# Patient Record
Sex: Female | Born: 1980 | Race: White | Hispanic: No | Marital: Single | Smoking: Current every day smoker
Health system: Southern US, Community
[De-identification: ages and names within clinical notes are randomized; demographics above are authoritative.]

## PROBLEM LIST (undated history)

## (undated) ENCOUNTER — Inpatient Hospital Stay (HOSPITAL_COMMUNITY): Payer: Self-pay

## (undated) ENCOUNTER — Inpatient Hospital Stay (HOSPITAL_COMMUNITY): Payer: Medicaid Other

## (undated) DIAGNOSIS — F329 Major depressive disorder, single episode, unspecified: Secondary | ICD-10-CM

## (undated) DIAGNOSIS — R51 Headache: Secondary | ICD-10-CM

## (undated) DIAGNOSIS — F1110 Opioid abuse, uncomplicated: Secondary | ICD-10-CM

## (undated) DIAGNOSIS — N390 Urinary tract infection, site not specified: Secondary | ICD-10-CM

## (undated) DIAGNOSIS — F419 Anxiety disorder, unspecified: Secondary | ICD-10-CM

## (undated) DIAGNOSIS — R87629 Unspecified abnormal cytological findings in specimens from vagina: Secondary | ICD-10-CM

## (undated) DIAGNOSIS — R519 Headache, unspecified: Secondary | ICD-10-CM

## (undated) DIAGNOSIS — F32A Depression, unspecified: Secondary | ICD-10-CM

## (undated) DIAGNOSIS — G8929 Other chronic pain: Secondary | ICD-10-CM

## (undated) DIAGNOSIS — A048 Other specified bacterial intestinal infections: Secondary | ICD-10-CM

## (undated) HISTORY — DX: Other chronic pain: G89.29

## (undated) HISTORY — DX: Headache, unspecified: R51.9

## (undated) HISTORY — DX: Headache: R51

## (undated) HISTORY — PX: WISDOM TOOTH EXTRACTION: SHX21

## (undated) HISTORY — PX: OTHER SURGICAL HISTORY: SHX169

## (undated) HISTORY — DX: Other specified bacterial intestinal infections: A04.8

---

## 2011-01-13 ENCOUNTER — Emergency Department (HOSPITAL_BASED_OUTPATIENT_CLINIC_OR_DEPARTMENT_OTHER)
Admission: EM | Admit: 2011-01-13 | Discharge: 2011-01-13 | Disposition: A | Discharge status: Home / Self Care | Payer: Self-pay | Attending: Emergency Medicine | Admitting: Emergency Medicine

## 2011-01-13 DIAGNOSIS — R51 Headache: Secondary | ICD-10-CM | POA: Insufficient documentation

## 2011-05-06 ENCOUNTER — Emergency Department (HOSPITAL_BASED_OUTPATIENT_CLINIC_OR_DEPARTMENT_OTHER)
Admission: EM | Admit: 2011-05-06 | Discharge: 2011-05-06 | Disposition: A | Discharge status: Home / Self Care | Payer: Self-pay | Attending: Emergency Medicine | Admitting: Emergency Medicine

## 2011-05-06 DIAGNOSIS — K089 Disorder of teeth and supporting structures, unspecified: Secondary | ICD-10-CM | POA: Insufficient documentation

## 2011-05-06 DIAGNOSIS — F17200 Nicotine dependence, unspecified, uncomplicated: Secondary | ICD-10-CM | POA: Insufficient documentation

## 2011-05-06 DIAGNOSIS — K0889 Other specified disorders of teeth and supporting structures: Secondary | ICD-10-CM

## 2011-05-06 MED ORDER — BUPIVACAINE HCL 0.5 % IJ SOLN
50.0000 mL | Freq: Once | INTRAMUSCULAR | Status: DC
  Filled 2011-05-06: qty 1

## 2011-05-06 MED ORDER — PENICILLIN V POTASSIUM 250 MG PO TABS: 250.0000 mg | ORAL_TABLET | Freq: Four times a day (QID) | ORAL | Status: AC

## 2011-05-06 MED ORDER — HYDROCODONE-ACETAMINOPHEN 5-500 MG PO TABS: 1.0000 | ORAL_TABLET | Freq: Four times a day (QID) | ORAL | Status: AC | PRN

## 2011-05-06 NOTE — ED Notes (Signed)
Dental pain that started last night.  States the tooth is broken.

## 2011-05-06 NOTE — ED Notes (Signed)
Deliah Boston, FNP at bedside.

## 2011-05-06 NOTE — ED Notes (Signed)
Pt asking for a dental block and she declines Lortab RX.

## 2011-05-06 NOTE — ED Provider Notes (Signed)
History     CSN: [id number] Arrival date & time: 05/06/2011 11:06 AM  Chief Complaint  Patient presents with  . Dental Pain   HPI Comments: Pt states that her right upper tooth broke about 2 weeks ago and she has started to have pain in the last couple of days  Patient is a 30 y.o. female presenting with tooth pain. The history is provided by the patient. No language interpreter was used.  Dental PainThe primary symptoms include mouth pain. Primary symptoms do not include fever. The symptoms began 3 to 5 days ago. The symptoms are worsening. The symptoms are new. The symptoms occur constantly.  Additional symptoms include: jaw pain. Additional symptoms do not include: facial swelling, trouble swallowing, pain with swallowing and ear pain.    History reviewed. No pertinent past medical history.    History reviewed. No pertinent past surgical history.  No family history on file.  History  Substance Use Topics  . Smoking status: Current Everyday Smoker -- 1.0 packs/day  . Smokeless tobacco: Not on file  . Alcohol Use: No    OB History    Grav Para Term Preterm Abortions TAB SAB Ect Mult Living                  Review of Systems  Constitutional: Negative.  Negative for fever.  HENT: Negative for ear pain, facial swelling and trouble swallowing.   Respiratory: Negative.   Cardiovascular: Negative.   Neurological: Negative.     Physical Exam  BP 119/71  Pulse 99  Temp(Src) 98.9 F (37.2 C) (Oral)  Resp 16  Ht 5\' 6"  (1.676 m)  Wt 180 lb (81.647 kg)  BMI 29.05 kg/m2  SpO2 100%  LMP 04/12/2011  Physical Exam  Nursing note and vitals reviewed. Constitutional: She is oriented to person, place, and time. She appears well-developed and well-nourished.  HENT:  Head: Normocephalic.  Right Ear: External ear normal.  Left Ear: External ear normal.  Mouth/Throat:    Eyes: Pupils are equal, round, and reactive to light.  Cardiovascular: Normal rate and regular rhythm.    Pulmonary/Chest: Effort normal and breath sounds normal.  Neurological: She is alert and oriented to person, place, and time.    ED Course  Dental Date/Time: 05/06/2011 1:07 PM Performed by: Lear Ng Authorized by: Lear Ng Consent: Verbal consent obtained. Risks and benefits: risks, benefits and alternatives were discussed Consent given by: patient Patient understanding: patient states understanding of the procedure being performed Patient consent: the patient's understanding of the procedure matches consent given Patient identity confirmed: verbally with patient Time out: Immediately prior to procedure a "time out" was called to verify the correct patient, procedure, equipment, support staff and site/side marked as required. Patient sedated: no Comments: Using 2 ml of marcaine 0.5%,administered by 5 cc syringe, dental block directly around right upper 2nd molar.  Pt tolerated well, pain seems improved.  No sig bleeding after procedure.      MDM Pt states that she has a dentist, but they are closed today      Teressa Lower, NP 05/06/11 1229     I have personally performed and participated in all the services and procedures documented herein. I have reviewed the findings with the patient.  Faatimah Spielberg Y.      Gavin Pound. Servando Kyllonen, MD 05/06/11 1310

## 2011-07-10 ENCOUNTER — Emergency Department (HOSPITAL_BASED_OUTPATIENT_CLINIC_OR_DEPARTMENT_OTHER)
Admission: EM | Admit: 2011-07-10 | Discharge: 2011-07-10 | Disposition: A | Discharge status: Home / Self Care | Payer: Self-pay | Attending: Emergency Medicine | Admitting: Emergency Medicine

## 2011-07-10 ENCOUNTER — Emergency Department (INDEPENDENT_AMBULATORY_CARE_PROVIDER_SITE_OTHER): Payer: Self-pay

## 2011-07-10 ENCOUNTER — Encounter (HOSPITAL_BASED_OUTPATIENT_CLINIC_OR_DEPARTMENT_OTHER): Payer: Self-pay | Admitting: *Deleted

## 2011-07-10 DIAGNOSIS — F17200 Nicotine dependence, unspecified, uncomplicated: Secondary | ICD-10-CM | POA: Insufficient documentation

## 2011-07-10 DIAGNOSIS — J449 Chronic obstructive pulmonary disease, unspecified: Secondary | ICD-10-CM | POA: Insufficient documentation

## 2011-07-10 DIAGNOSIS — R05 Cough: Secondary | ICD-10-CM

## 2011-07-10 DIAGNOSIS — J4489 Other specified chronic obstructive pulmonary disease: Secondary | ICD-10-CM | POA: Insufficient documentation

## 2011-07-10 DIAGNOSIS — R059 Cough, unspecified: Secondary | ICD-10-CM | POA: Insufficient documentation

## 2011-07-10 DIAGNOSIS — J40 Bronchitis, not specified as acute or chronic: Secondary | ICD-10-CM | POA: Insufficient documentation

## 2011-07-10 LAB — URINALYSIS, ROUTINE W REFLEX MICROSCOPIC
Bilirubin Urine: NEGATIVE
Glucose, UA: NEGATIVE mg/dL
Hgb urine dipstick: NEGATIVE
Specific Gravity, Urine: 1.02 (ref 1.005–1.030)
pH: 8.5 — ABNORMAL HIGH (ref 5.0–8.0)

## 2011-07-10 LAB — URINE MICROSCOPIC-ADD ON

## 2011-07-10 MED ORDER — ALBUTEROL SULFATE (5 MG/ML) 0.5% IN NEBU
5.0000 mg | INHALATION_SOLUTION | Freq: Once | RESPIRATORY_TRACT | Status: AC
  Administered 2011-07-10: 5 mg via RESPIRATORY_TRACT
  Filled 2011-07-10: qty 1

## 2011-07-10 MED ORDER — IPRATROPIUM BROMIDE 0.02 % IN SOLN
0.5000 mg | Freq: Once | RESPIRATORY_TRACT | Status: AC
  Administered 2011-07-10: 0.5 mg via RESPIRATORY_TRACT
  Filled 2011-07-10: qty 2.5

## 2011-07-10 MED ORDER — AZITHROMYCIN 250 MG PO TABS: 250.0000 mg | ORAL_TABLET | Freq: Every day | ORAL | Status: AC

## 2011-07-10 MED ORDER — ALBUTEROL SULFATE HFA 108 (90 BASE) MCG/ACT IN AERS: 1.0000 | INHALATION_SPRAY | Freq: Four times a day (QID) | RESPIRATORY_TRACT | Status: DC | PRN

## 2011-07-10 MED ORDER — METRONIDAZOLE 500 MG PO TABS: 500.0000 mg | ORAL_TABLET | Freq: Two times a day (BID) | ORAL | Status: AC

## 2011-07-10 MED ORDER — ALBUTEROL SULFATE HFA 108 (90 BASE) MCG/ACT IN AERS
2.0000 | INHALATION_SPRAY | Freq: Once | RESPIRATORY_TRACT | Status: AC
  Administered 2011-07-10: 2 via RESPIRATORY_TRACT
  Filled 2011-07-10: qty 6.7

## 2011-07-10 NOTE — ED Provider Notes (Signed)
Medical screening examination/treatment/procedure(s) were performed by non-physician practitioner and as supervising physician I was immediately available for consultation/collaboration.  Ethelda Chick, MD 07/10/11 [phone]

## 2011-07-10 NOTE — ED Provider Notes (Addendum)
History     CSN: [id number] Arrival date & time: 07/10/2011  1:57 PM   First MD Initiated Contact with Patient 07/10/11 1400      Chief Complaint  Patient presents with  . Cough    (Consider location/radiation/quality/duration/timing/severity/associated sxs/prior treatment) Patient is a 30 y.o. female presenting with cough. The history is provided by the patient. No language interpreter was used.  Cough This is a new problem. The current episode started more than 1 week ago. The problem occurs constantly. The problem has not changed since onset.The cough is non-productive. The fever has been present for 3 to 4 days. Associated symptoms include shortness of breath and wheezing. She has tried nothing for the symptoms. The treatment provided no relief. Risk factors: none. She is a smoker. Her past medical history is significant for asthma. Her past medical history does not include COPD.    History reviewed. No pertinent past medical history.  History reviewed. No pertinent past surgical history.  History reviewed. No pertinent family history.  History  Substance Use Topics  . Smoking status: Current Everyday Smoker -- 1.0 packs/day  . Smokeless tobacco: Not on file  . Alcohol Use: No    OB History    Grav Para Term Preterm Abortions TAB SAB Ect Mult Living                  Review of Systems  Respiratory: Positive for cough, shortness of breath and wheezing.   All other systems reviewed and are negative.    Allergies  Review of patient's allergies indicates no known allergies.  Home Medications   Current Outpatient Rx  Name Route Sig Dispense Refill  . ARIPIPRAZOLE 2 MG PO TABS Oral Take 2 mg by mouth daily.      . BUSPIRONE HCL 10 MG PO TABS Oral Take 10 mg by mouth 3 (three) times daily.      . SERTRALINE HCL 100 MG PO TABS Oral Take 100 mg by mouth daily.        Pulse 114  Temp(Src) 98.6 F (37 C) (Oral)  Resp 20  Ht 5\' 6"  (1.676 m)  Wt 192 lb (87.091 kg)   BMI 30.99 kg/m2  SpO2 97%  LMP 06/26/2011  Physical Exam  Nursing note and vitals reviewed. Constitutional: She is oriented to person, place, and time. She appears well-developed and well-nourished.  HENT:  Head: Normocephalic and atraumatic.  Right Ear: External ear normal.  Left Ear: External ear normal.  Nose: Nose normal.  Mouth/Throat: Oropharynx is clear and moist.  Eyes: Conjunctivae and EOM are normal. Pupils are equal, round, and reactive to light.  Neck: Normal range of motion. Neck supple.  Cardiovascular: Normal rate and regular rhythm.   Pulmonary/Chest: Effort normal and breath sounds normal.  Abdominal: Soft.  Musculoskeletal: Normal range of motion.  Neurological: She is alert and oriented to person, place, and time. She has normal reflexes.  Skin: Skin is warm.  Psychiatric: She has a normal mood and affect.    ED Course  Procedures (including critical care time)  Labs Reviewed - No data to display Dg Chest 2 View  07/10/2011  *RADIOLOGY REPORT*  Clinical Data: Cough  CHEST - 2 VIEW  Comparison: None.  Findings: Lungs clear.  Heart size and pulmonary vascularity normal.  No effusion.  Visualized bones unremarkable.  IMPRESSION: No acute disease  Original Report Authenticated By: Osa Craver, M.D.     No diagnosis found.    MDM  Pt given albuterol and atrovent   I advised pt to follow up with primary MD.        Langston Masker, PA 07/10/11 1552  Langston Masker, [place] 07/10/11 1554  Langston Masker, [place] 07/10/11 1557  Langston Masker, [place] 07/10/11 [phone]

## 2011-07-10 NOTE — ED Notes (Signed)
Pt states she has had a cough since Tuesday. Also has hx of bladder infection and is not sure it is gone.

## 2011-07-11 NOTE — ED Provider Notes (Signed)
Medical screening examination/treatment/procedure(s) were performed by non-physician practitioner and as supervising physician I was immediately available for consultation/collaboration.  Ethelda Chick, MD 07/11/11 [phone]

## 2012-02-27 ENCOUNTER — Encounter (HOSPITAL_BASED_OUTPATIENT_CLINIC_OR_DEPARTMENT_OTHER): Payer: Self-pay | Admitting: *Deleted

## 2012-02-27 ENCOUNTER — Emergency Department (HOSPITAL_BASED_OUTPATIENT_CLINIC_OR_DEPARTMENT_OTHER)
Admission: EM | Admit: 2012-02-27 | Discharge: 2012-02-27 | Disposition: A | Discharge status: Home / Self Care | Payer: Medicaid Other | Attending: Emergency Medicine | Admitting: Emergency Medicine

## 2012-02-27 DIAGNOSIS — G43909 Migraine, unspecified, not intractable, without status migrainosus: Secondary | ICD-10-CM

## 2012-02-27 DIAGNOSIS — F17200 Nicotine dependence, unspecified, uncomplicated: Secondary | ICD-10-CM | POA: Insufficient documentation

## 2012-02-27 MED ORDER — METOCLOPRAMIDE HCL 5 MG/ML IJ SOLN
10.0000 mg | Freq: Once | INTRAMUSCULAR | Status: AC
  Administered 2012-02-27: 10 mg via INTRAMUSCULAR
  Filled 2012-02-27: qty 2

## 2012-02-27 MED ORDER — DEXAMETHASONE SODIUM PHOSPHATE 10 MG/ML IJ SOLN
10.0000 mg | Freq: Once | INTRAMUSCULAR | Status: AC
  Administered 2012-02-27: 10 mg via INTRAMUSCULAR
  Filled 2012-02-27: qty 1

## 2012-02-27 MED ORDER — PROMETHAZINE HCL 25 MG PO TABS: 25.0000 mg | ORAL_TABLET | Freq: Four times a day (QID) | ORAL | Status: DC | PRN

## 2012-02-27 MED ORDER — TRAMADOL HCL 50 MG PO TABS: 50.0000 mg | ORAL_TABLET | Freq: Four times a day (QID) | ORAL | Status: AC | PRN

## 2012-02-27 MED ORDER — DIPHENHYDRAMINE HCL 50 MG/ML IJ SOLN
25.0000 mg | Freq: Once | INTRAMUSCULAR | Status: AC
  Administered 2012-02-27: 25 mg via INTRAMUSCULAR
  Filled 2012-02-27: qty 1

## 2012-02-27 NOTE — ED Provider Notes (Signed)
History   This chart was scribed for Rolan Bucco, MD by Sofie Rower. The patient was seen in room MH07/MH07 and the patient's care was started at 4:30 PM     CSN: [id number]  Arrival date & time 02/27/12  1546   First MD Initiated Contact with Patient 02/27/12 1619      Chief Complaint  Patient presents with  . Migraine    (Consider location/radiation/quality/duration/timing/severity/associated sxs/prior treatment) HPI  [name] [name] is a 31 y.o. female who presents to the Emergency Department complaining of moderate, episodic migraine onset four days ago with associated symptoms of sweats, nausea. The pt states "the headaches last for 2-3 days, then it will ease up." The pt informs the EDP that "she is taking over the counter medicines everyday." Pt states "it hurts through my temples to the back of my neck to the back of my head." Modifying factors include Excedrin migraine, tension migraine which both provide moderate relief. Pt has a hx of narcotics addiction, headaches.  Over last year, has had chronic daily headaches, is taking OTC meds daily.  Has migraine intermittently.  Today's headache is the same type headache that she typically has.  No unusual symptoms  Pt denies fever, weight loss, difficulty urinating, skin rash, difficulty with balance.    Pt does not have insurance but states she should have some "within a month or so."    History  Substance Use Topics  . Smoking status: Current Everyday Smoker -- 1.0 packs/day  . Smokeless tobacco: Not on file  . Alcohol Use: No    OB History    Grav Para Term Preterm Abortions TAB SAB Ect Mult Living                  Review of Systems  Constitutional: Negative for fever, chills, diaphoresis and fatigue.  HENT: Negative for congestion, rhinorrhea and sneezing.   Eyes: Negative.   Respiratory: Negative for cough, chest tightness and shortness of breath.   Cardiovascular: Negative for chest pain and leg swelling.    Gastrointestinal: Positive for nausea. Negative for vomiting, abdominal pain, diarrhea and blood in stool.  Genitourinary: Negative for frequency, hematuria, flank pain and difficulty urinating.  Musculoskeletal: Negative for back pain and arthralgias.  Skin: Negative for rash.  Neurological: Positive for headaches. Negative for dizziness, speech difficulty, weakness and numbness.    Allergies  Review of patient's allergies indicates no known allergies.  Home Medications   Current Outpatient Rx  Name Route Sig Dispense Refill  . ARIPIPRAZOLE 2 MG PO TABS Oral Take 2 mg by mouth daily.      Marlin Canary HEADACHE PO Oral Take 1 packet by mouth daily as needed. Patient used this medication for her headache.    . BUSPIRONE HCL 10 MG PO TABS Oral Take 10 mg by mouth 3 (three) times daily.      . IBUPROFEN 200 MG PO TABS Oral Take 800 mg by mouth every 6 (six) hours as needed. Patient used this medication for her headache.    . SERTRALINE HCL 100 MG PO TABS Oral Take 100 mg by mouth daily.      Marland Kitchen PROMETHAZINE HCL 25 MG PO TABS Oral Take 1 tablet (25 mg total) by mouth every 6 (six) hours as needed for nausea. 30 tablet 0  . TRAMADOL HCL 50 MG PO TABS Oral Take 1 tablet (50 mg total) by mouth every 6 (six) hours as needed for pain. 15 tablet 0    BP  126/75  Pulse 92  Temp 99 F (37.2 C) (Oral)  Resp 16  Ht 5\' 6"  (1.676 m)  Wt 188 lb (85.276 kg)  BMI 30.34 kg/m2  SpO2 100%  LMP 02/13/2012  Physical Exam  Nursing note and vitals reviewed. Constitutional: She is oriented to person, place, and time. She appears well-developed and well-nourished.  HENT:  Head: Normocephalic and atraumatic.  Eyes: Pupils are equal, round, and reactive to light.       Normal fundi   Neck: Normal range of motion. Neck supple.  Cardiovascular: Normal rate, regular rhythm and normal heart sounds.   Pulmonary/Chest: Effort normal and breath sounds normal. No respiratory distress. She has no wheezes. She has no  rales. She exhibits no tenderness.  Abdominal: Soft. Bowel sounds are normal. There is no tenderness. There is no rebound and no guarding.  Musculoskeletal: Normal range of motion. She exhibits no edema.  Lymphadenopathy:    She has no cervical adenopathy.  Neurological: She is alert and oriented to person, place, and time. She has normal strength. No cranial nerve deficit or sensory deficit. GCS eye subscore is 4. GCS verbal subscore is 5. GCS motor subscore is 6.  Skin: Skin is warm and dry. No rash noted.  Psychiatric: She has a normal mood and affect.    ED Course  Procedures (including critical care time)  DIAGNOSTIC STUDIES: Oxygen Saturation is 100% on room air, normal by my interpretation.    COORDINATION OF CARE:   4:33PM- EDP at bedside discusses treatment plan concerning rebound headaches and follow up with a PCP, [facility].  Labs Reviewed - No data to display No results found.   1. Migraine       MDM  Pt with migraine, typical pain for her.  Nothing to suggest SAH, meningitis.  Discussed importance of f/u for headache management      I personally performed the services described in this documentation, which was scribed in my presence.  The recorded information has been reviewed and considered.    Rolan Bucco, MD 02/27/12 [phone]

## 2012-02-27 NOTE — ED Notes (Signed)
Pt c/o migraine x4 days

## 2012-05-08 ENCOUNTER — Emergency Department (HOSPITAL_BASED_OUTPATIENT_CLINIC_OR_DEPARTMENT_OTHER)
Admission: EM | Admit: 2012-05-08 | Discharge: 2012-05-08 | Disposition: A | Discharge status: Home / Self Care | Payer: Medicaid Other | Attending: Emergency Medicine | Admitting: Emergency Medicine

## 2012-05-08 ENCOUNTER — Encounter (HOSPITAL_BASED_OUTPATIENT_CLINIC_OR_DEPARTMENT_OTHER): Payer: Self-pay

## 2012-05-08 DIAGNOSIS — T63441A Toxic effect of venom of bees, accidental (unintentional), initial encounter: Secondary | ICD-10-CM

## 2012-05-08 DIAGNOSIS — F17200 Nicotine dependence, unspecified, uncomplicated: Secondary | ICD-10-CM | POA: Insufficient documentation

## 2012-05-08 DIAGNOSIS — T6391XA Toxic effect of contact with unspecified venomous animal, accidental (unintentional), initial encounter: Secondary | ICD-10-CM | POA: Insufficient documentation

## 2012-05-08 DIAGNOSIS — T63461A Toxic effect of venom of wasps, accidental (unintentional), initial encounter: Secondary | ICD-10-CM | POA: Insufficient documentation

## 2012-05-08 DIAGNOSIS — R51 Headache: Secondary | ICD-10-CM | POA: Insufficient documentation

## 2012-05-08 MED ORDER — PREDNISONE 10 MG PO TABS: ORAL_TABLET | ORAL | Status: DC

## 2012-05-08 MED ORDER — KETOROLAC TROMETHAMINE 60 MG/2ML IM SOLN
60.0000 mg | Freq: Once | INTRAMUSCULAR | Status: AC
  Administered 2012-05-08: 60 mg via INTRAMUSCULAR
  Filled 2012-05-08: qty 2

## 2012-05-08 NOTE — ED Provider Notes (Signed)
Medical screening examination/treatment/procedure(s) were performed by non-physician practitioner and as supervising physician I was immediately available for consultation/collaboration.  Geoffery Lyons, MD 05/08/12 [phone]

## 2012-05-08 NOTE — ED Provider Notes (Signed)
History     CSN: [id number]  Arrival date & time 05/08/12  1602   First MD Initiated Contact with Patient 05/08/12 1639      Chief Complaint  Patient presents with  . Insect Bite    (Consider location/radiation/quality/duration/timing/severity/associated sxs/prior treatment) Patient is a 31 y.o. female presenting with arm injury. The history is provided by the patient. No language interpreter was used.  Arm Injury  The incident occurred yesterday. There have been no prior injuries to these areas. Her tetanus status is UTD.  Pt complains of pain to left forearm from a bee sting.  Pt report bee sting on right arm stayed swollen for several days.   Pt also complains of a headache.  (Pt request medications for headache but does not want narcotics)  History reviewed. No pertinent past medical history.  History reviewed. No pertinent past surgical history.  No family history on file.  History  Substance Use Topics  . Smoking status: Current Everyday Smoker -- 1.0 packs/day  . Smokeless tobacco: Not on file  . 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  All other systems reviewed and are negative.    Allergies  Review of patient's allergies indicates no known allergies.  Home Medications   Current Outpatient Rx  Name Route Sig Dispense Refill  . ARIPIPRAZOLE 2 MG PO TABS Oral Take 2 mg by mouth daily.      Marlin Canary HEADACHE PO Oral Take 1 packet by mouth daily as needed. Patient used this medication for her headache.    . BUSPIRONE HCL 10 MG PO TABS Oral Take 10 mg by mouth 3 (three) times daily.      . IBUPROFEN 200 MG PO TABS Oral Take 200 mg by mouth every 6 (six) hours as needed. Patient used this medication for her headache.    . SERTRALINE HCL 100 MG PO TABS Oral Take 100 mg by mouth daily.      Marland Kitchen PROMETHAZINE HCL 25 MG PO TABS Oral  Take 1 tablet (25 mg total) by mouth every 6 (six) hours as needed for nausea. 30 tablet 0    BP 122/70  Pulse 92  Temp 98.6 F (37 C) (Oral)  Resp 18  Ht 5\' 6"  (1.676 m)  Wt 182 lb (82.555 kg)  BMI 29.38 kg/m2  SpO2 98%  LMP 05/08/2012  Physical Exam  Nursing note and vitals reviewed. Constitutional: She is oriented to person, place, and time. She appears well-developed and well-nourished.  HENT:  Head: Normocephalic.  Eyes: EOM are normal.  Neck: Normal range of motion.  Pulmonary/Chest: Effort normal.  Abdominal: She exhibits no distension.  Musculoskeletal: Normal range of motion.  Neurological: She is alert and oriented to person, place, and time. She has normal reflexes.  Skin: There is erythema.       Swollen 10 cm area of left arm,  Warm to touch  Psychiatric: She has a normal mood and affect.    ED Course  Procedures (including critical care time)  Labs Reviewed - No data to display No results found.   No diagnosis found.    MDM  Pt given torodol for headache.   I will treat with prednisone for bite.  Pt advised to  return if any problems.        Lonia Skinner [place], [place] 05/08/12 1752  Lonia Skinner [place], [place] 05/08/12 2150

## 2012-05-08 NOTE — ED Notes (Signed)
Bee sting to left forearm.  Redness, swelling and pain at affected site.

## 2012-09-09 ENCOUNTER — Encounter (HOSPITAL_BASED_OUTPATIENT_CLINIC_OR_DEPARTMENT_OTHER): Payer: Self-pay | Admitting: *Deleted

## 2012-09-09 ENCOUNTER — Emergency Department (HOSPITAL_BASED_OUTPATIENT_CLINIC_OR_DEPARTMENT_OTHER)
Admission: EM | Admit: 2012-09-09 | Discharge: 2012-09-09 | Disposition: A | Discharge status: Home / Self Care | Payer: Self-pay | Attending: Emergency Medicine | Admitting: Emergency Medicine

## 2012-09-09 DIAGNOSIS — Z79899 Other long term (current) drug therapy: Secondary | ICD-10-CM | POA: Insufficient documentation

## 2012-09-09 DIAGNOSIS — R209 Unspecified disturbances of skin sensation: Secondary | ICD-10-CM | POA: Insufficient documentation

## 2012-09-09 DIAGNOSIS — F17200 Nicotine dependence, unspecified, uncomplicated: Secondary | ICD-10-CM | POA: Insufficient documentation

## 2012-09-09 DIAGNOSIS — R200 Anesthesia of skin: Secondary | ICD-10-CM

## 2012-09-09 LAB — BASIC METABOLIC PANEL
BUN: 11 mg/dL (ref 6–23)
GFR calc Af Amer: >90 mL/min (ref >90)
GFR calc non Af Amer: >90 mL/min (ref >90)
Potassium: 3.8 mEq/L (ref 3.5–5.1)
Sodium: 143 mEq/L (ref 135–145)

## 2012-09-09 LAB — CBC WITH DIFFERENTIAL/PLATELET
Basophils Relative: 0 % (ref 0–1)
Hemoglobin: 12.4 g/dL (ref 12.0–15.0)
MCHC: 32.4 g/dL (ref 30.0–36.0)
Monocytes Relative: 7 % (ref 3–12)
Neutro Abs: 6.9 10*3/uL (ref 1.7–7.7)
Neutrophils Relative %: 57 % (ref 43–77)
Platelets: 310 10*3/uL (ref 150–400)
RBC: 4.38 MIL/uL (ref 3.87–5.11)

## 2012-09-09 NOTE — ED Notes (Signed)
Pt states she woke up around midnight last p.m. And could not feel her legs. Collapsed onto floor. Today legs feel heavy. Ambulatory to ED. No neuro deficits noted at triage.

## 2012-09-09 NOTE — ED Provider Notes (Signed)
History     CSN: [id number]  Arrival date & time 09/09/12  1305   First MD Initiated Contact with Patient 09/09/12 1356      Chief Complaint  Patient presents with  . Extremity Weakness    (Consider location/radiation/quality/duration/timing/severity/associated sxs/prior treatment) HPI Comments: Patient states she woke up from sleep with an inability to feel her legs.  They were "numb".  She tried to get up and walk and they would not hold her.  This is improving now.  She denies any back pain.  There are no bowel or bladder complaints.  She denies any injury or trauma.  This has never happened before.    Patient is a 31 y.o. female presenting with extremity weakness. The history is provided by the patient.  Extremity Weakness This is a new problem. The problem occurs constantly. The problem has been rapidly improving. Pertinent negatives include no abdominal pain. Nothing aggravates the symptoms. Nothing relieves the symptoms. She has tried nothing for the symptoms.    History reviewed. No pertinent past medical history.  History reviewed. No pertinent past surgical history.  History reviewed. No pertinent family history.  History  Substance Use Topics  . Smoking status: Current Every Day Smoker -- 1.0 packs/day  . Smokeless tobacco: Not on file  . Alcohol Use: No     Comment: recovering addict    OB History    Grav Para Term Preterm Abortions TAB SAB Ect Mult Living                  Review of Systems  Gastrointestinal: Negative for abdominal pain.  Musculoskeletal: Positive for extremity weakness.  All other systems reviewed and are negative.    Allergies  Review of patient's allergies indicates no known allergies.  Home Medications   Current Outpatient Rx  Name  Route  Sig  Dispense  Refill  . ARIPIPRAZOLE 2 MG PO TABS   Oral   Take 2 mg by mouth daily.           Marlin Canary HEADACHE PO   Oral   Take 1 packet by mouth daily as needed. Patient used this  medication for her headache.         . BUSPIRONE HCL 10 MG PO TABS   Oral   Take 10 mg by mouth 3 (three) times daily.           . IBUPROFEN 200 MG PO TABS   Oral   Take 200 mg by mouth every 6 (six) hours as needed. Patient used this medication for her headache.         Marland Kitchen PREDNISONE 10 MG PO TABS      6,5,4,3,2,1 taper   21 tablet   0   . PROMETHAZINE HCL 25 MG PO TABS   Oral   Take 1 tablet (25 mg total) by mouth every 6 (six) hours as needed for nausea.   30 tablet   0   . SERTRALINE HCL 100 MG PO TABS   Oral   Take 100 mg by mouth daily.             BP 127/70  Pulse 88  Temp 98.3 F (36.8 C) (Oral)  Resp 20  Ht 5\' 6"  (1.676 m)  Wt 182 lb (82.555 kg)  BMI 29.38 kg/m2  SpO2 100%  LMP 09/09/2012  Physical Exam  Nursing note and vitals reviewed. Constitutional: She is oriented to person, place, and time. She appears well-developed and well-nourished.  No distress.  HENT:  Head: Normocephalic and atraumatic.  Neck: Normal range of motion. Neck supple.  Musculoskeletal: Normal range of motion.       There is no lumbar ttp.  Neurological: She is alert and oriented to person, place, and time.       Strength is 5/5 in the ble.  Ambulates on heels, toes without difficulty.  DTR's are trace and equal in the ble.    Skin: Skin is warm and dry. She is not diaphoretic.    ED Course  Procedures (including critical care time)  Labs Reviewed  CBC WITH DIFFERENTIAL - Abnormal; Notable for the following:    WBC 12.2 (*)     Lymphs Abs 4.3 (*)     All other components within normal limits  BASIC METABOLIC PANEL   No results found.   No diagnosis found.    MDM  I am unsure as to why her legs went "numb" last night, but this does not appear to be anything emergent.  The electrolytes are normal and the reflexes and strength in her legs are normal and symmetrical.  Possible neuropraxia related to sleep position.  Will discharge to home, return prn for any  problems.        Geoffery Lyons, MD 09/09/12 1539

## 2012-09-09 NOTE — ED Notes (Signed)
Patient C/O a funny, cramping feeling in both legs.  Also C/O back pain and reports pain shooting down her left leg.  Back pain is exacerbated when her left leg is raise but is unaffected by raising her right leg.  Sensation intact.  2+ bilateral DP pulses

## 2012-11-27 ENCOUNTER — Emergency Department (HOSPITAL_BASED_OUTPATIENT_CLINIC_OR_DEPARTMENT_OTHER): Payer: Self-pay

## 2012-11-27 ENCOUNTER — Emergency Department (HOSPITAL_BASED_OUTPATIENT_CLINIC_OR_DEPARTMENT_OTHER)
Admission: EM | Admit: 2012-11-27 | Discharge: 2012-11-27 | Disposition: A | Discharge status: Home / Self Care | Payer: Self-pay | Attending: Emergency Medicine | Admitting: Emergency Medicine

## 2012-11-27 ENCOUNTER — Encounter (HOSPITAL_BASED_OUTPATIENT_CLINIC_OR_DEPARTMENT_OTHER): Payer: Self-pay | Admitting: *Deleted

## 2012-11-27 DIAGNOSIS — W010XXA Fall on same level from slipping, tripping and stumbling without subsequent striking against object, initial encounter: Secondary | ICD-10-CM | POA: Insufficient documentation

## 2012-11-27 DIAGNOSIS — Z79899 Other long term (current) drug therapy: Secondary | ICD-10-CM | POA: Insufficient documentation

## 2012-11-27 DIAGNOSIS — F17200 Nicotine dependence, unspecified, uncomplicated: Secondary | ICD-10-CM | POA: Insufficient documentation

## 2012-11-27 DIAGNOSIS — Y929 Unspecified place or not applicable: Secondary | ICD-10-CM | POA: Insufficient documentation

## 2012-11-27 DIAGNOSIS — S335XXA Sprain of ligaments of lumbar spine, initial encounter: Secondary | ICD-10-CM | POA: Insufficient documentation

## 2012-11-27 DIAGNOSIS — Y939 Activity, unspecified: Secondary | ICD-10-CM | POA: Insufficient documentation

## 2012-11-27 DIAGNOSIS — S39012A Strain of muscle, fascia and tendon of lower back, initial encounter: Secondary | ICD-10-CM

## 2012-11-27 HISTORY — DX: Opioid abuse, uncomplicated: F11.10

## 2012-11-27 MED ORDER — CYCLOBENZAPRINE HCL 10 MG PO TABS: 10.0000 mg | ORAL_TABLET | Freq: Two times a day (BID) | ORAL | Status: DC | PRN

## 2012-11-27 NOTE — ED Provider Notes (Signed)
History     CSN: [id number]  Arrival date & time 11/27/12  1715   First MD Initiated Contact with Patient 11/27/12 1729      Chief Complaint  Patient presents with  . Fall    (Consider location/radiation/quality/duration/timing/severity/associated sxs/prior treatment) The history is provided by the patient.  [name] [name] is a 32 y.o. female history of narcotic abuse here presenting with back pain. She slipped on ice and fell her back yesterday. No head injury or LOC. She has severe back pain since then radiating down the right buttock. Denies any weakness or numbness or incontinence. No history of chronic back pain. Tried to take some naprosyn without any relief.    Past Medical History  Diagnosis Date  . Narcotic abuse     History reviewed. No pertinent past surgical history.  No family history on file.  History  Substance Use Topics  . Smoking status: Current Every Day Smoker -- 1.00 packs/day    Types: Cigarettes  . Smokeless tobacco: Not on file  . Alcohol Use: No     Comment: recovering addict    OB History   Grav Para Term Preterm Abortions TAB SAB Ect Mult Living                  Review of Systems  Musculoskeletal: Positive for back pain.  All other systems reviewed and are negative.    Allergies  Review of patient's allergies indicates no known allergies.  Home Medications   Current Outpatient Rx  Name  Route  Sig  Dispense  Refill  . ARIPiprazole (ABILIFY) 2 MG tablet   Oral   Take 2 mg by mouth daily.           . Aspirin-Acetaminophen-Caffeine (GOODY HEADACHE PO)   Oral   Take 1 packet by mouth daily as needed. Patient used this medication for her headache.         . busPIRone (BUSPAR) 10 MG tablet   Oral   Take 10 mg by mouth 3 (three) times daily.           . cyclobenzaprine (FLEXERIL) 10 MG tablet   Oral   Take 1 tablet (10 mg total) by mouth 2 (two) times daily as needed for muscle spasms.   20 tablet   0   . ibuprofen  (ADVIL,MOTRIN) 200 MG tablet   Oral   Take 200 mg by mouth every 6 (six) hours as needed. Patient used this medication for her headache.         . predniSONE (DELTASONE) 10 MG tablet      6,5,4,3,2,1 taper   21 tablet   0   . EXPIRED: promethazine (PHENERGAN) 25 MG tablet   Oral   Take 1 tablet (25 mg total) by mouth every 6 (six) hours as needed for nausea.   30 tablet   0   . sertraline (ZOLOFT) 100 MG tablet   Oral   Take 100 mg by mouth daily.             BP 112/61  Pulse 97  Temp(Src) 98.4 F (36.9 C) (Oral)  Resp 18  Wt 185 lb (83.915 kg)  BMI 29.87 kg/m2  SpO2 98%  Physical Exam  Nursing note and vitals reviewed. Constitutional: She is oriented to person, place, and time. She appears well-developed and well-nourished.  Uncomfortable   HENT:  Head: Normocephalic.  Mouth/Throat: Oropharynx is clear and moist.  Eyes: Conjunctivae are normal. Pupils are equal, round, and  reactive to light.  Neck: Normal range of motion. Neck supple.  Cardiovascular: Normal rate, regular rhythm and normal heart sounds.   Pulmonary/Chest: Effort normal and breath sounds normal. No respiratory distress. She has no wheezes. She has no rales. She exhibits no tenderness.  Abdominal: Soft. Bowel sounds are normal. She exhibits no distension. There is no tenderness. There is no rebound and no guarding.  Musculoskeletal: Normal range of motion.  + diffuse lumbar spasms. No midline tenderness. Nl ROM hips.   Neurological: She is alert and oriented to person, place, and time.  No saddle anesthesia. Neg straight leg raise bilaterally. 2+ pulses.   Skin: Skin is warm and dry.  Psychiatric: She has a normal mood and affect. Her behavior is normal. Judgment and thought content normal.    ED Course  Procedures (including critical care time)  Labs Reviewed - No data to display Dg Lumbar Spine Complete  11/27/2012  *RADIOLOGY REPORT*  Clinical Data: Low back pain following a fall  yesterday.  LUMBAR SPINE - COMPLETE 4+ VIEW  Comparison: None.  Findings: Five non-rib bearing lumbar vertebrae.  Minimal levoconvex lumbar scoliosis.  No fractures, pars defects or subluxations.  IMPRESSION: No fracture or subluxation.   Original Report Authenticated By: Beckie Salts, M.D.      1. Strain of lumbar paraspinal muscle, initial encounter       MDM  [name] [name] is a 32 y.o. female here with back pain s/p fall. I think she likely has muscle spasms. Recommend flexeril and naprosyn as patient wants to avoid narcotics. I counseled her she doesn't need xrays but she still want one. Neurologically intact so no need for MRI.   6:30 PM Xray nl. Will d/c home on flexeril and naprosyn.          Richardean Canal, MD 11/27/12 [phone]

## 2012-11-27 NOTE — ED Notes (Signed)
Patient transported to X-ray 

## 2012-11-27 NOTE — ED Notes (Signed)
Slipped on ice and fell yesterday. Pain in he lower back and buttocks.

## 2013-02-27 ENCOUNTER — Emergency Department (HOSPITAL_BASED_OUTPATIENT_CLINIC_OR_DEPARTMENT_OTHER)
Admission: EM | Admit: 2013-02-27 | Discharge: 2013-02-27 | Disposition: A | Discharge status: Home / Self Care | Payer: Medicaid Other | Attending: Emergency Medicine | Admitting: Emergency Medicine

## 2013-02-27 ENCOUNTER — Encounter (HOSPITAL_BASED_OUTPATIENT_CLINIC_OR_DEPARTMENT_OTHER): Payer: Self-pay

## 2013-02-27 DIAGNOSIS — F17200 Nicotine dependence, unspecified, uncomplicated: Secondary | ICD-10-CM | POA: Insufficient documentation

## 2013-02-27 DIAGNOSIS — Z79899 Other long term (current) drug therapy: Secondary | ICD-10-CM | POA: Insufficient documentation

## 2013-02-27 DIAGNOSIS — K029 Dental caries, unspecified: Secondary | ICD-10-CM | POA: Insufficient documentation

## 2013-02-27 DIAGNOSIS — IMO0002 Reserved for concepts with insufficient information to code with codable children: Secondary | ICD-10-CM | POA: Insufficient documentation

## 2013-02-27 DIAGNOSIS — K0889 Other specified disorders of teeth and supporting structures: Secondary | ICD-10-CM

## 2013-02-27 DIAGNOSIS — K089 Disorder of teeth and supporting structures, unspecified: Secondary | ICD-10-CM | POA: Insufficient documentation

## 2013-02-27 MED ORDER — AMOXICILLIN 500 MG PO CAPS: 500.0000 mg | ORAL_CAPSULE | Freq: Three times a day (TID) | ORAL | Status: DC

## 2013-02-27 MED ORDER — HYDROCODONE-ACETAMINOPHEN 5-325 MG PO TABS: 2.0000 | ORAL_TABLET | ORAL | Status: DC | PRN

## 2013-02-27 MED ORDER — BUPIVACAINE-EPINEPHRINE PF 0.5-1:200000 % IJ SOLN
10.0000 mL | Freq: Once | INTRAMUSCULAR | Status: AC
  Administered 2013-02-27: 19:00:00
  Filled 2013-02-27: qty 10

## 2013-02-27 MED ORDER — BUPIVACAINE-EPINEPHRINE (PF) 0.5% -1:200000 IJ SOLN
INTRAMUSCULAR | Status: AC
  Filled 2013-02-27: qty 1.8

## 2013-02-27 NOTE — ED Notes (Signed)
Pt c/o severe R upper posterior toothache.  Pt states that she cannot tolerate the pain any longer, does not have a dentist.

## 2013-02-27 NOTE — ED Provider Notes (Signed)
History     CSN: [id number]  Arrival date & time 02/27/13  1826   First MD Initiated Contact with Patient 02/27/13 1838      Chief Complaint  Patient presents with  . Dental Pain    (Consider location/radiation/quality/duration/timing/severity/associated sxs/prior treatment) HPI Comments: Patient presents to the ER for evaluation of dental pain. Patient reports that she has been having intermittent pain in the right upper molar for about a week. She says she was exhibiting increased pain today, so she tried to pull the tooth out. Since then, her pain has significantly worsened. Pain is now severe. No bleeding or drainage.  Patient is a 32 y.o. female presenting with tooth pain.  Dental Pain   Past Medical History  Diagnosis Date  . Narcotic abuse     History reviewed. No pertinent past surgical history.  History reviewed. No pertinent family history.  History  Substance Use Topics  . Smoking status: Current Every Day Smoker -- 2.00 packs/day for 15 years    Types: Cigarettes  . Smokeless tobacco: Not on file  . Alcohol Use: No     Comment: recovering addict    OB History   Grav Para Term Preterm Abortions TAB SAB Ect Mult Living                  Review of Systems  HENT: Positive for dental problem.     Allergies  Review of patient's allergies indicates no known allergies.  Home Medications   Current Outpatient Rx  Name  Route  Sig  Dispense  Refill  . Aspirin-Acetaminophen-Caffeine (GOODY HEADACHE PO)   Oral   Take 1 packet by mouth daily as needed. Patient used this medication for her headache.         . benzocaine (ORAJEL) 10 % mucosal gel   Mouth/Throat   Use as directed 1 application in the mouth or throat as needed for pain.         . ARIPiprazole (ABILIFY) 2 MG tablet   Oral   Take 2 mg by mouth daily.           . busPIRone (BUSPAR) 10 MG tablet   Oral   Take 10 mg by mouth 3 (three) times daily.           . cyclobenzaprine  (FLEXERIL) 10 MG tablet   Oral   Take 1 tablet (10 mg total) by mouth 2 (two) times daily as needed for muscle spasms.   20 tablet   0   . ibuprofen (ADVIL,MOTRIN) 200 MG tablet   Oral   Take 200 mg by mouth every 6 (six) hours as needed. Patient used this medication for her headache.         . predniSONE (DELTASONE) 10 MG tablet      6,5,4,3,2,1 taper   21 tablet   0   . EXPIRED: promethazine (PHENERGAN) 25 MG tablet   Oral   Take 1 tablet (25 mg total) by mouth every 6 (six) hours as needed for nausea.   30 tablet   0   . sertraline (ZOLOFT) 100 MG tablet   Oral   Take 100 mg by mouth daily.             BP 124/69  Pulse 94  Temp(Src) 99.2 F (37.3 C) (Oral)  Resp 16  Ht 5\' 6"  (1.676 m)  Wt 168 lb (76.204 kg)  BMI 27.13 kg/m2  SpO2 99%  LMP 02/01/2013  Physical Exam  Constitutional: She is oriented to person, place, and time. She appears well-developed and well-nourished. No distress.  HENT:  Head: Normocephalic and atraumatic.  Right Ear: Hearing normal.  Left Ear: Hearing normal.  Nose: Nose normal.  Mouth/Throat: Oropharynx is clear and moist and mucous membranes are normal. Dental caries present. No dental abscesses.    Eyes: Conjunctivae and EOM are normal. Pupils are equal, round, and reactive to light.  Neck: Normal range of motion. Neck supple.  Cardiovascular: Regular rhythm, S1 normal and S2 normal.  Exam reveals no gallop and no friction rub.   No murmur heard. Pulmonary/Chest: Effort normal and breath sounds normal. No respiratory distress. She exhibits no tenderness.  Abdominal: Soft. Normal appearance and bowel sounds are normal. There is no hepatosplenomegaly. There is no tenderness. There is no rebound, no guarding, no tenderness at McBurney's point and negative Murphy's sign. No hernia.  Musculoskeletal: Normal range of motion.  Neurological: She is alert and oriented to person, place, and time. She has normal strength. No cranial nerve  deficit or sensory deficit. Coordination normal. GCS eye subscore is 4. GCS verbal subscore is 5. GCS motor subscore is 6.  Skin: Skin is warm, dry and intact. No rash noted. No cyanosis.  Psychiatric: She has a normal mood and affect. Her speech is normal and behavior is normal. Thought content normal.    ED Course  Procedures (including critical care time)  Dental block: Periapical block Marcaine 0.5% with epinephrine was utilized. Landmarks were identified. 1.2 ml injected into the periapical position on the outside portion of the tooth and then 0.6 mL injected at the medial hard palate. Patient tolerated the procedure well. No complications.  Labs Reviewed - No data to display No results found.   Diagnosis: Dental pain    MDM  Patient presents to ER with complaints of progressively worsening dental pain over one week. She will be treated with analgesia and antibiotic. Followup with dentist as soon as possible.        Gilda Crease, MD 02/27/13 [phone]

## 2013-07-21 ENCOUNTER — Emergency Department (HOSPITAL_BASED_OUTPATIENT_CLINIC_OR_DEPARTMENT_OTHER): Payer: Medicaid Other

## 2013-07-21 ENCOUNTER — Inpatient Hospital Stay (HOSPITAL_COMMUNITY): Payer: Medicaid Other

## 2013-07-21 ENCOUNTER — Inpatient Hospital Stay (HOSPITAL_BASED_OUTPATIENT_CLINIC_OR_DEPARTMENT_OTHER)
Admission: EM | Admit: 2013-07-21 | Discharge: 2013-07-21 | Disposition: A | Discharge status: Home / Self Care | Payer: Medicaid Other | Attending: Obstetrics & Gynecology | Admitting: Obstetrics & Gynecology

## 2013-07-21 ENCOUNTER — Encounter (HOSPITAL_BASED_OUTPATIENT_CLINIC_OR_DEPARTMENT_OTHER): Payer: Self-pay | Admitting: Emergency Medicine

## 2013-07-21 DIAGNOSIS — F17200 Nicotine dependence, unspecified, uncomplicated: Secondary | ICD-10-CM | POA: Insufficient documentation

## 2013-07-21 DIAGNOSIS — N949 Unspecified condition associated with female genital organs and menstrual cycle: Secondary | ICD-10-CM | POA: Insufficient documentation

## 2013-07-21 DIAGNOSIS — K219 Gastro-esophageal reflux disease without esophagitis: Secondary | ICD-10-CM | POA: Insufficient documentation

## 2013-07-21 DIAGNOSIS — D72829 Elevated white blood cell count, unspecified: Secondary | ICD-10-CM | POA: Insufficient documentation

## 2013-07-21 DIAGNOSIS — R109 Unspecified abdominal pain: Secondary | ICD-10-CM | POA: Insufficient documentation

## 2013-07-21 HISTORY — DX: Major depressive disorder, single episode, unspecified: F32.9

## 2013-07-21 HISTORY — DX: Anxiety disorder, unspecified: F41.9

## 2013-07-21 HISTORY — DX: Depression, unspecified: F32.A

## 2013-07-21 LAB — CBC WITH DIFFERENTIAL/PLATELET
Basophils Absolute: 0 10*3/uL (ref 0.0–0.1)
Basophils Relative: 0 % (ref 0–1)
Hemoglobin: 13.3 g/dL (ref 12.0–15.0)
Lymphs Abs: 2.9 10*3/uL (ref 0.7–4.0)
MCHC: 32.8 g/dL (ref 30.0–36.0)
Monocytes Relative: 6 % (ref 3–12)
Neutro Abs: 12 10*3/uL — ABNORMAL HIGH (ref 1.7–7.7)
Neutrophils Relative %: 75 % (ref 43–77)
RBC: 4.8 MIL/uL (ref 3.87–5.11)
RDW: 15 % (ref 11.5–15.5)

## 2013-07-21 LAB — COMPREHENSIVE METABOLIC PANEL
ALT: 14 U/L (ref 0–35)
AST: 18 U/L (ref 0–37)
Albumin: 4.2 g/dL (ref 3.5–5.2)
Alkaline Phosphatase: 71 U/L (ref 39–117)
BUN: 18 mg/dL (ref 6–23)
Chloride: 101 mEq/L (ref 96–112)
Glucose, Bld: 93 mg/dL (ref 70–99)
Potassium: 4.2 mEq/L (ref 3.5–5.1)
Total Bilirubin: 0.1 mg/dL — ABNORMAL LOW (ref 0.3–1.2)

## 2013-07-21 LAB — WET PREP, GENITAL
Clue Cells Wet Prep HPF POC: NONE SEEN
Trich, Wet Prep: NONE SEEN
Yeast Wet Prep HPF POC: NONE SEEN

## 2013-07-21 LAB — URINALYSIS, ROUTINE W REFLEX MICROSCOPIC
Glucose, UA: NEGATIVE mg/dL
Nitrite: NEGATIVE
Protein, ur: NEGATIVE mg/dL
Urobilinogen, UA: 0.2 mg/dL (ref 0.0–1.0)

## 2013-07-21 LAB — PREGNANCY, URINE: Preg Test, Ur: NEGATIVE

## 2013-07-21 LAB — LIPASE, BLOOD: Lipase: 46 U/L (ref 11–59)

## 2013-07-21 LAB — URINE MICROSCOPIC-ADD ON

## 2013-07-21 MED ORDER — ONDANSETRON HCL 4 MG/2ML IJ SOLN
4.0000 mg | Freq: Once | INTRAMUSCULAR | Status: AC
  Administered 2013-07-21: 4 mg via INTRAVENOUS
  Filled 2013-07-21: qty 2

## 2013-07-21 MED ORDER — SODIUM CHLORIDE 0.9 % IV BOLUS (SEPSIS)
1000.0000 mL | Freq: Once | INTRAVENOUS | Status: AC
  Administered 2013-07-21: 1000 mL via INTRAVENOUS

## 2013-07-21 MED ORDER — KETOROLAC TROMETHAMINE 30 MG/ML IJ SOLN
30.0000 mg | Freq: Once | INTRAMUSCULAR | Status: AC
  Administered 2013-07-21: 30 mg via INTRAVENOUS
  Filled 2013-07-21: qty 1

## 2013-07-21 MED ORDER — OMEPRAZOLE 20 MG PO CPDR: 20.0000 mg | DELAYED_RELEASE_CAPSULE | Freq: Every day | ORAL | Status: DC

## 2013-07-21 MED ORDER — GI COCKTAIL ~~LOC~~
ORAL | Status: DC
Start: 2013-07-21 — End: 2013-07-22
  Filled 2013-07-21: qty 30

## 2013-07-21 MED ORDER — FAMOTIDINE 20 MG PO TABS
40.0000 mg | ORAL_TABLET | Freq: Once | ORAL | Status: AC
  Administered 2013-07-21: 40 mg via ORAL
  Filled 2013-07-21: qty 2

## 2013-07-21 MED ORDER — IOHEXOL 300 MG/ML  SOLN
100.0000 mL | Freq: Once | INTRAMUSCULAR | Status: AC | PRN
  Administered 2013-07-21: 100 mL via INTRAVENOUS

## 2013-07-21 MED ORDER — GI COCKTAIL ~~LOC~~
30.0000 mL | Freq: Once | ORAL | Status: AC
  Administered 2013-07-21: 30 mL via ORAL

## 2013-07-21 MED ORDER — IOHEXOL 300 MG/ML  SOLN
50.0000 mL | Freq: Once | INTRAMUSCULAR | Status: AC | PRN
  Administered 2013-07-21: 50 mL via ORAL

## 2013-07-21 NOTE — ED Notes (Signed)
Patient c/o intermittent mid abd pain/nausea, no fever, no diarrhea. Dry heaves, but not vomiting

## 2013-07-21 NOTE — MAU Note (Signed)
Patient returned from Radiology. 

## 2013-07-21 NOTE — MAU Note (Signed)
Pt c/o severe pain in her upperabdomen in epigastric area.  Doesn't occur after eating; says it is a constant burning, pressure sort of pain; nausea with dry heaves for 10 days.

## 2013-07-21 NOTE — MAU Provider Note (Signed)
Attestation of Attending Supervision of Advanced Practitioner (CNM/NP): Evaluation and management procedures were performed by the Advanced Practitioner under my supervision and collaboration. I have reviewed the Advanced Practitioner's note and chart, and I agree with the management and plan.  Gwendalyn Mcgonagle H. 8:49 PM

## 2013-07-21 NOTE — ED Provider Notes (Addendum)
TIME SEEN: 10:40 AM  CHIEF COMPLAINT: Abdominal pain, nausea  HPI: Patient is a 32 year old female with a prior history of narcotic abuse who presents to the emergency department with complaints of 1-1/2 weeks of diffuse, sharp and crampy abdominal pain associated with nausea and dry heaves. She denies any vomiting or diarrhea. No bloody stool or melena. No dysuria or hematuria. No vaginal bleeding or discharge. Her last menstrual period was October 16. No prior abdominal surgeries. No sick contacts or recent travel. She denies any aggravating or alleviating factors.  ROS: See HPI Constitutional: no fever  Eyes: no drainage  ENT: no runny nose   Cardiovascular:  no chest pain  Resp: no SOB  GI: no vomiting GU: no dysuria Integumentary: no rash  Allergy: no hives  Musculoskeletal: no leg swelling  Neurological: no slurred speech ROS otherwise negative  PAST MEDICAL HISTORY/PAST SURGICAL HISTORY:  Past Medical History  Diagnosis Date  . Narcotic abuse     MEDICATIONS:  Prior to Admission medications   Medication Sig Start Date End Date Taking? Authorizing Provider  Aspirin-Acetaminophen-Caffeine (GOODY HEADACHE PO) Take by mouth.   Yes Historical Provider, MD  calcium carbonate (TUMS - DOSED IN MG ELEMENTAL CALCIUM) 500 MG chewable tablet Chew 1 tablet by mouth as needed for indigestion or heartburn.   Yes Historical Provider, MD  amoxicillin (AMOXIL) 500 MG capsule Take 1 capsule (500 mg total) by mouth 3 (three) times daily. 02/27/13   Gilda Crease, MD  ARIPiprazole (ABILIFY) 2 MG tablet Take 2 mg by mouth daily.      Historical Provider, MD  Aspirin-Acetaminophen-Caffeine (GOODY HEADACHE PO) Take 1 packet by mouth daily as needed. Patient used this medication for her headache.    Historical Provider, MD  benzocaine (ORAJEL) 10 % mucosal gel Use as directed 1 application in the mouth or throat as needed for pain.    Historical Provider, MD  busPIRone (BUSPAR) 10 MG tablet  Take 10 mg by mouth 3 (three) times daily.      Historical Provider, MD  cyclobenzaprine (FLEXERIL) 10 MG tablet Take 1 tablet (10 mg total) by mouth 2 (two) times daily as needed for muscle spasms. 11/27/12   Richardean Canal, MD  HYDROcodone-acetaminophen (NORCO/VICODIN) 5-325 MG per tablet Take 2 tablets by mouth every 4 (four) hours as needed for pain. 02/27/13   Gilda Crease, MD  ibuprofen (ADVIL,MOTRIN) 200 MG tablet Take 200 mg by mouth every 6 (six) hours as needed. Patient used this medication for her headache.    Historical Provider, MD  predniSONE (DELTASONE) 10 MG tablet 6,5,4,3,2,1 taper 05/08/12   Elson Areas, PA-C  promethazine (PHENERGAN) 25 MG tablet Take 1 tablet (25 mg total) by mouth every 6 (six) hours as needed for nausea. 02/27/12 03/05/12  Rolan Bucco, MD  sertraline (ZOLOFT) 100 MG tablet Take 100 mg by mouth daily.      Historical Provider, MD    ALLERGIES:  No Known Allergies  SOCIAL HISTORY:  History  Substance Use Topics  . Smoking status: Current Every Day Smoker -- 2.00 packs/day for 15 years    Types: Cigarettes  . Smokeless tobacco: Not on file  . Alcohol Use: No     Comment: recovering addict    FAMILY HISTORY: No family history on file.  EXAM: BP 126/74  Pulse 104  Temp(Src) 98 F (36.7 C) (Oral)  Resp 22  Ht 5\' 6"  (1.676 m)  Wt 167 lb (75.751 kg)  BMI 26.97 kg/m2  SpO2 100% CONSTITUTIONAL: Alert and oriented and responds appropriately to questions. Well-appearing; well-nourished HEAD: Normocephalic EYES: Conjunctivae clear, PERRL ENT: normal nose; no rhinorrhea; moist mucous membranes; pharynx without lesions noted NECK: Supple, no meningismus, no LAD  CARD: RRR; S1 and S2 appreciated; no murmurs, no clicks, no rubs, no gallops RESP: Normal chest excursion without splinting or tachypnea; breath sounds clear and equal bilaterally; no wheezes, no rhonchi, no rales,  ABD/GI: Normal bowel sounds; non-distended; soft, but is diffusely  tender to palpation with voluntary guarding, no rebound or peritoneal sign, negative Murphy sign BACK:  The back appears normal and is non-tender to palpation, there is no CVA tenderness EXT: Normal ROM in all joints; non-tender to palpation; no edema; normal capillary refill; no cyanosis    SKIN: Normal color for age and race; warm NEURO: Moves all extremities equally PSYCH: The patient's mood and manner are appropriate. Grooming and personal hygiene are appropriate.  MEDICAL DECISION MAKING: Patient here with abdominal pain and nausea that has been going on for the past week and a half. She is extremely tender to palpation diffusely across her abdomen and is tearful on exam. She is mildly tachycardic. Otherwise hemodynamically stable. Will obtain abdominal labs, urine, CT scan. Patient openly reports that she has a history of narcotic abuse and asks that no narcotics be given during this visit. We'll give IV fluids, Toradol and Zofran.  ED PROGRESS: Patient has a leukocytosis of 16 with left shift. Her CT scan shows no abnormality. She states she is feeling better but now her exam is localized to the left lower cautery. Will perform pelvic exam with cultures.   Patient has exquisite left adnexal tenderness on pelvic exam. No cervical motion tenderness. No right adnexal tenderness or fullness. There is a minimal amount of thin, white vaginal discharge. No bleeding. No external vaginal lesions. She reports she's never had any history of sexually transmitted diseases. The last time she was sexually active was 2-1/2 years ago. Discussed with patient that I am concerned with how tender she is over her left ovary and given her leukocytosis I have recommended a pelvic ultrasound. We are unable to perform an ultrasound at this facility today. Have recommended transfer to [facility] via [facility]. Patient reports that because she has her daughter and does not want to frighten her she would prefer arriving by  private vehicle.  Spoke with Dr. Penne Lash who agrees to see pt at [facility].  Also discussed with radiologist, Lanae Crumbly, reports the patient's appendix is normal. He also reports that the patient has increased size of her left ovary which is nonspecific and recommended pelvic ultrasound. Right ovary shows a possible collapsing cyst.   Patient is now complaining of epigastric pain on exam. She describes it as feeling like a "knot". Negative Murphy's sign.  GB normal on CT imaging.  LFTs and lipase normal.  Patient given a GI cocktail and reports her epigastric pain is improved but is still having left lower quadrant pain. [facility] Will give prescription for omeprazole. Will give outpatient followup information. Given return precautions. Patient verbalizes understanding and is comfortable with plan.  Layla Maw Mena Simonis, DO 07/21/13 1421  Layla Maw Quenesha Douglass, DO 07/21/13 1553

## 2013-07-21 NOTE — MAU Provider Note (Signed)
History     CSN: [id number]  Arrival date and time: 07/21/13 1017   First Provider Initiated Contact with Patient 07/21/13 1730      Chief Complaint  Patient presents with  . Abdominal Pain   HPI Comments: [name] [name] 32 y.o. [id number] was transferred to MAU from [facility] for pelvic/ abdominal pains. See that note.  She is transferred because they do not have the capacity for transvaginal ultrasound at [facility].     Abdominal Pain      Past Medical History  Diagnosis Date  . Narcotic abuse   . Anxiety   . Depression     Past Surgical History  Procedure Laterality Date  . Abortions    . Wisdom tooth extraction      Family History  Problem Relation Age of Onset  . Diabetes Mother   . Cancer Maternal Aunt   . Cancer Maternal Uncle   . Cancer Paternal Aunt   . Cancer Paternal Uncle     History  Substance Use Topics  . Smoking status: Current Every Day Smoker -- 2.00 packs/day for 15 years    Types: Cigarettes  . Smokeless tobacco: Not on file  . Alcohol Use: No     Comment: recovering addict    Allergies: No Known Allergies  Prescriptions prior to admission  Medication Sig Dispense Refill  . Aspirin-Acetaminophen-Caffeine (GOODY HEADACHE PO) Take 1 packet by mouth as needed (headache).       . calcium carbonate (TUMS - DOSED IN MG ELEMENTAL CALCIUM) 500 MG chewable tablet Chew 6-8 tablets by mouth as needed for indigestion or heartburn.         Review of Systems  Gastrointestinal: Positive for abdominal pain.   Physical Exam   Blood pressure 109/64, pulse 81, temperature 98 F (36.7 C), temperature source Oral, resp. rate 20, height 5\' 6"  (1.676 m), weight 75.751 kg (167 lb), last menstrual period 06/27/2013, SpO2 100.00%.  Physical Exam  MAU Course  Procedures  MDM Transferred care to D. Camrie Stock, CNM Reviewed records and results from Urgent Care. Dr. Penne Lash examined pt.  US Transvaginal Non-ob  07/21/2013   CLINICAL DATA:   Pelvic pain.  Clinical concern for ovarian torsion.  EXAM: TRANSABDOMINAL AND TRANSVAGINAL ULTRASOUND OF PELVIS  DOPPLER ULTRASOUND OF OVARIES  TECHNIQUE: Both transabdominal and transvaginal ultrasound examinations of the pelvis were performed. Transabdominal technique was performed for global imaging of the pelvis including uterus, ovaries, adnexal regions, and pelvic cul-de-sac.  It was necessary to proceed with endovaginal exam following the transabdominal exam to visualize the uterus and ovaries in better detail, including blood flow to the ovaries. Color and duplex Doppler ultrasound was utilized to evaluate blood flow to the ovaries.  COMPARISON:  Pelvis CT dated 07/21/2013.  FINDINGS: Uterus  Measurements: 6.9 x 5.1 x 4.0 cm. No fibroids or other mass visualized.  Endometrium  Thickness: 11.0 mm.  No focal abnormality visualized.  Right ovary  Measurements: 3.9 x 2.6 x 1.9 cm. Normal appearance/no adnexal mass.  Left ovary  Measurements: 3.4 x 2.5 x 1.6 cm. Normal appearance/no adnexal mass.  Pulsed Doppler evaluation of both ovaries demonstrates normal low-resistance arterial and venous waveforms.  Other findings  Small amount of free peritoneal fluid, within normal limits of physiological fluid.  IMPRESSION: Normal examination. No evidence of ovarian torsion.   Electronically Signed   By: Gordan Payment M.D.   On: 07/21/2013 20:00   US Pelvis Complete  07/21/2013   CLINICAL DATA:  Pelvic pain.  Clinical concern for ovarian torsion.  EXAM: TRANSABDOMINAL AND TRANSVAGINAL ULTRASOUND OF PELVIS  DOPPLER ULTRASOUND OF OVARIES  TECHNIQUE: Both transabdominal and transvaginal ultrasound examinations of the pelvis were performed. Transabdominal technique was performed for global imaging of the pelvis including uterus, ovaries, adnexal regions, and pelvic cul-de-sac.  It was necessary to proceed with endovaginal exam following the transabdominal exam to visualize the uterus and ovaries in better detail, including  blood flow to the ovaries. Color and duplex Doppler ultrasound was utilized to evaluate blood flow to the ovaries.  COMPARISON:  Pelvis CT dated 07/21/2013.  FINDINGS: Uterus  Measurements: 6.9 x 5.1 x 4.0 cm. No fibroids or other mass visualized.  Endometrium  Thickness: 11.0 mm.  No focal abnormality visualized.  Right ovary  Measurements: 3.9 x 2.6 x 1.9 cm. Normal appearance/no adnexal mass.  Left ovary  Measurements: 3.4 x 2.5 x 1.6 cm. Normal appearance/no adnexal mass.  Pulsed Doppler evaluation of both ovaries demonstrates normal low-resistance arterial and venous waveforms.  Other findings  Small amount of free peritoneal fluid, within normal limits of physiological fluid.  IMPRESSION: Normal examination. No evidence of ovarian torsion.   Electronically Signed   By: Gordan Payment M.D.   On: 07/21/2013 20:00   Ct Abdomen Pelvis W Contrast  07/21/2013   ADDENDUM REPORT: 07/21/2013 13:33  ADDENDUM: The appendix is visualized and normal in caliber. There is no evidence of acute appendicitis. The left ovary is mildly more prominent in size compared to the right, which is nonspecific. There is an appearance suggestive of a peripheral enhancing collapsing cyst of the right ovary measuring up to 2.5 cm in diameter. Correlation with pelvic ultrasound may be helpful.   Electronically Signed   By: Irish Lack M.D.   On: 07/21/2013 13:33   07/21/2013   CLINICAL DATA:  Abdominal pain and nausea.  EXAM: CT ABDOMEN AND PELVIS WITH CONTRAST  TECHNIQUE: Multidetector CT imaging of the abdomen and pelvis was performed using the standard protocol following bolus administration of intravenous contrast.  CONTRAST:  50mL OMNIPAQUE IOHEXOL 300 MG/ML SOLN, OMNIPAQUE IOHEXOL 300 MG/ML SOLN  COMPARISON:  None.  FINDINGS: The liver, gallbladder, pancreas, spleen, adrenal glands and kidneys are within normal limits. Bowel loops show no evidence of obstruction or ileus. No acute inflammatory process or abnormal fluid  collection is identified.  No masses or enlarged lymph nodes are seen. No vascular abnormalities are identified. The uterus and adnexal regions are unremarkable by CT. The bladder is within normal limits. No hernias are identified. Bony structures show mild disc space narrowing at L5-S1.  IMPRESSION: No acute abnormalities.  Electronically Signed: By: Irish Lack M.D. On: 07/21/2013 11:57   [place] Art/ven Flow Abd Pelv Doppler  07/21/2013   CLINICAL DATA:  Pelvic pain.  Clinical concern for ovarian torsion.  EXAM: TRANSABDOMINAL AND TRANSVAGINAL ULTRASOUND OF PELVIS  DOPPLER ULTRASOUND OF OVARIES  TECHNIQUE: Both transabdominal and transvaginal ultrasound examinations of the pelvis were performed. Transabdominal technique was performed for global imaging of the pelvis including uterus, ovaries, adnexal regions, and pelvic cul-de-sac.  It was necessary to proceed with endovaginal exam following the transabdominal exam to visualize the uterus and ovaries in better detail, including blood flow to the ovaries. Color and duplex Doppler ultrasound was utilized to evaluate blood flow to the ovaries.  COMPARISON:  Pelvis CT dated 07/21/2013.  FINDINGS: Uterus  Measurements: 6.9 x 5.1 x 4.0 cm. No fibroids or other mass visualized.  Endometrium  Thickness: 11.0  mm.  No focal abnormality visualized.  Right ovary  Measurements: 3.9 x 2.6 x 1.9 cm. Normal appearance/no adnexal mass.  Left ovary  Measurements: 3.4 x 2.5 x 1.6 cm. Normal appearance/no adnexal mass.  Pulsed Doppler evaluation of both ovaries demonstrates normal low-resistance arterial and venous waveforms.  Other findings  Small amount of free peritoneal fluid, within normal limits of physiological fluid.  IMPRESSION: Normal examination. No evidence of ovarian torsion.   Electronically Signed   By: Gordan Payment M.D.   On: 07/21/2013 20:00  After return form [place], having epigastric pain and TTP> Pepcid 40 mg po given with some improvement.  Assessment and Plan    1. Abdominal pain   2. Leukocytosis   3. GERD (gastroesophageal reflux disease)    Plan  Carolynn Serve 07/21/2013, 6:22 PM   Discharge home.   Medication List         calcium carbonate 500 MG chewable tablet  Commonly known as:  TUMS - dosed in mg elemental calcium  Chew 6-8 tablets by mouth as needed for indigestion or heartburn.     GOODY HEADACHE PO  Take 1 packet by mouth as needed (headache).     omeprazole 20 MG capsule  Commonly known as:  PRILOSEC  Take 1 capsule (20 mg total) by mouth daily.       Follow-up Information   Follow up with [facility]. Schedule an appointment as soon as possible for a visit in 1 week.   Contact information:   [address] [place] [address] [phone]

## 2013-07-26 ENCOUNTER — Emergency Department (HOSPITAL_BASED_OUTPATIENT_CLINIC_OR_DEPARTMENT_OTHER)
Admission: EM | Admit: 2013-07-26 | Discharge: 2013-07-26 | Disposition: A | Discharge status: Home / Self Care | Payer: Medicaid Other | Attending: Emergency Medicine | Admitting: Emergency Medicine

## 2013-07-26 ENCOUNTER — Encounter (HOSPITAL_BASED_OUTPATIENT_CLINIC_OR_DEPARTMENT_OTHER): Payer: Self-pay | Admitting: Emergency Medicine

## 2013-07-26 DIAGNOSIS — Z79899 Other long term (current) drug therapy: Secondary | ICD-10-CM | POA: Insufficient documentation

## 2013-07-26 DIAGNOSIS — R197 Diarrhea, unspecified: Secondary | ICD-10-CM | POA: Insufficient documentation

## 2013-07-26 DIAGNOSIS — R112 Nausea with vomiting, unspecified: Secondary | ICD-10-CM | POA: Insufficient documentation

## 2013-07-26 DIAGNOSIS — Z3202 Encounter for pregnancy test, result negative: Secondary | ICD-10-CM | POA: Insufficient documentation

## 2013-07-26 DIAGNOSIS — Z8659 Personal history of other mental and behavioral disorders: Secondary | ICD-10-CM | POA: Insufficient documentation

## 2013-07-26 DIAGNOSIS — F17200 Nicotine dependence, unspecified, uncomplicated: Secondary | ICD-10-CM | POA: Insufficient documentation

## 2013-07-26 LAB — PREGNANCY, URINE: Preg Test, Ur: NEGATIVE

## 2013-07-26 LAB — COMPREHENSIVE METABOLIC PANEL
ALT: 12 U/L (ref 0–35)
AST: 17 U/L (ref 0–37)
Calcium: 9.7 mg/dL (ref 8.4–10.5)
Creatinine, Ser: 0.7 mg/dL (ref 0.50–1.10)
GFR calc Af Amer: >90 mL/min (ref >90)
Glucose, Bld: 101 mg/dL — ABNORMAL HIGH (ref 70–99)
Sodium: 141 mEq/L (ref 135–145)
Total Bilirubin: 0.2 mg/dL — ABNORMAL LOW (ref 0.3–1.2)
Total Protein: 7.9 g/dL (ref 6.0–8.3)

## 2013-07-26 LAB — LIPASE, BLOOD: Lipase: 41 U/L (ref 11–59)

## 2013-07-26 LAB — CBC WITH DIFFERENTIAL/PLATELET
Basophils Absolute: 0 10*3/uL (ref 0.0–0.1)
Eosinophils Absolute: 0.2 10*3/uL (ref 0.0–0.7)
Eosinophils Relative: 1 % (ref 0–5)
HCT: 40.4 % (ref 36.0–46.0)
MCH: 27.6 pg (ref 26.0–34.0)
MCV: 85.8 fL (ref 78.0–100.0)
Neutrophils Relative %: 54 % (ref 43–77)
Platelets: 351 10*3/uL (ref 150–400)
RBC: 4.71 MIL/uL (ref 3.87–5.11)
RDW: 15.2 % (ref 11.5–15.5)
WBC: 12.2 10*3/uL — ABNORMAL HIGH (ref 4.0–10.5)

## 2013-07-26 LAB — URINALYSIS, ROUTINE W REFLEX MICROSCOPIC
Bilirubin Urine: NEGATIVE
Hgb urine dipstick: NEGATIVE
Leukocytes, UA: NEGATIVE
Nitrite: NEGATIVE
Specific Gravity, Urine: 1.024 (ref 1.005–1.030)
Urobilinogen, UA: 0.2 mg/dL (ref 0.0–1.0)

## 2013-07-26 MED ORDER — SODIUM CHLORIDE 0.9 % IV BOLUS (SEPSIS)
1000.0000 mL | Freq: Once | INTRAVENOUS | Status: AC
  Administered 2013-07-26: 1000 mL via INTRAVENOUS

## 2013-07-26 MED ORDER — ONDANSETRON HCL 4 MG/2ML IJ SOLN
4.0000 mg | Freq: Once | INTRAMUSCULAR | Status: AC
  Administered 2013-07-26: 4 mg via INTRAVENOUS
  Filled 2013-07-26: qty 2

## 2013-07-26 MED ORDER — ONDANSETRON 8 MG PO TBDP: 8.0000 mg | ORAL_TABLET | Freq: Three times a day (TID) | ORAL | Status: DC | PRN

## 2013-07-26 NOTE — ED Provider Notes (Signed)
CSN: [id number]     Arrival date & time 07/26/13  6962 History  This chart was scribed for [name] Quarry, MD by Joaquin Music, ED Scribe. This patient was seen in room MH07/MH07 and the patient's care was started at 6:50 PM.   Chief Complaint  Patient presents with  . Abdominal Pain   Patient is a 32 y.o. female presenting with abdominal pain. The history is provided by the patient. No language interpreter was used.  Abdominal Pain Pain location:  Epigastric Pain quality: aching, cramping, pressure and throbbing   Pain radiates to:  Does not radiate Pain severity:  Moderate Onset quality:  Sudden Duration:  2 weeks Timing:  Constant Progression:  Worsening Chronicity:  New Context: not eating, not recent illness, not recent sexual activity and not recent travel   Relieved by:  Nothing Worsened by:  Nothing tried Ineffective treatments:  None tried Associated symptoms: diarrhea and vomiting   Associated symptoms: no fever, no hematemesis, no hematochezia, no hematuria, no vaginal bleeding and no vaginal discharge   Diarrhea:    Quality:  Watery   Number of occurrences:  Several   Severity:  Severe   Duration:  2 weeks   Timing:  Constant   Progression:  Worsening Vomiting:    Quality:  Bilious material, stomach contents and undigested food   Severity:  Moderate   Duration:  2 days   Timing:  Constant   Progression:  Worsening Risk factors: has not had multiple surgeries and not pregnant    HPI Comments: [name] [name] is a 32 y.o. female who presents to the Emergency Department complaining of ongoing worsening upper abd pain that has been going on for the past 2 weeks. Pt states she having several episodes of emesis for the past 2 days and ongoing diarrhea. She states is always nauseas. Pt denies having hx of abd pain or  Abnormalities. Pt states she is unable to eat a meal without experiencing emesis and diarrhea. She states she generally eats a piece of toast and  has a tendency to vomit shortly after. Her emesis consist of food content the first time and the times following consist of yellow acid, she states. Pt denies having vaginal discharge. Pt LNMP was July 28, 2013. Pt denies taking BC. Pt denies being sexually active. Pt states she has been pregnant 3 times but only has 1 child. Pt denies having an STD or pelvic infection. Pt denies having surgeries.Pt denies having hematochezia and hemoptysis. Pt denies having fever.  Pt states she was seen at [facility] on Sunday (07/21/2013) for severe abd pain. She states she was informed she has elevated white blood count and was eventually D/C. She states her pain continued and began seeking further evaluation 2 days later. Pt states she tested positive for H. Pylori. Pt was seen by Dr. Mayford Knife at a local clinic. She states she had a normal ultrasound with no abnormal findings. She states she was D/C with antibiotics: Amoxicillin and Flagil. She states she was prescribed Ferocet for pain due to not being able to take narcotics. She denies having a PCP prior to this visit. Pt states she is currently taking OTC tums and antacid rx.  Past Medical History  Diagnosis Date  . Narcotic abuse   . Anxiety   . Depression    Past Surgical History  Procedure Laterality Date  . Abortions    . Wisdom tooth extraction     Family History  Problem Relation Age of Onset  .  Diabetes Mother   . Cancer Maternal Aunt   . Cancer Maternal Uncle   . Cancer Paternal Aunt   . Cancer Paternal Uncle    History  Substance Use Topics  . Smoking status: Current Every Day Smoker -- 2.00 packs/day for 15 years    Types: Cigarettes  . Smokeless tobacco: Not on file  . Alcohol Use: No     Comment: recovering addict   OB History   Grav Para Term Preterm Abortions TAB SAB Ect Mult Living   3 1   2 2    1      Review of Systems  Constitutional: Negative for fever.  Gastrointestinal: Positive for vomiting, abdominal pain and  diarrhea. Negative for hematochezia and hematemesis.  Genitourinary: Negative for hematuria, vaginal bleeding and vaginal discharge.  All other systems reviewed and are negative.    Allergies  Review of patient's allergies indicates no known allergies.  Home Medications   Current Outpatient Rx  Name  Route  Sig  Dispense  Refill  . Aspirin-Acetaminophen-Caffeine (GOODY HEADACHE PO)   Oral   Take 1 packet by mouth as needed (headache).          . calcium carbonate (TUMS - DOSED IN MG ELEMENTAL CALCIUM) 500 MG chewable tablet   Oral   Chew 6-8 tablets by mouth as needed for indigestion or heartburn.          Marland Kitchen omeprazole (PRILOSEC) 20 MG capsule   Oral   Take 1 capsule (20 mg total) by mouth daily.   30 capsule   1    Triage Vitals: BP 131/81  Pulse 81  Temp(Src) 99 F (37.2 C) (Oral)  Resp 16  Wt 167 lb (75.751 kg)  SpO2 100%  LMP 06/27/2013  Physical Exam  Nursing note and vitals reviewed. Constitutional: She is oriented to person, place, and time. She appears well-developed and well-nourished.  HENT:  Head: Normocephalic and atraumatic.  Right Ear: External ear normal.  Left Ear: External ear normal.  Nose: Nose normal.  Mouth/Throat: Oropharynx is clear and moist.  Eyes: Conjunctivae and EOM are normal. Pupils are equal, round, and reactive to light.  Neck: Normal range of motion. Neck supple. No JVD present. No tracheal deviation present. No thyromegaly present.  Cardiovascular: Normal rate, regular rhythm, normal heart sounds and intact distal pulses.   Pulmonary/Chest: Effort normal and breath sounds normal. No respiratory distress. She has no wheezes.  Abdominal: Soft. Bowel sounds are normal. She exhibits no mass. There is no tenderness. There is no guarding.  Musculoskeletal: Normal range of motion.  Lymphadenopathy:    She has no cervical adenopathy.  Neurological: She is alert and oriented to person, place, and time. She has normal reflexes. No  cranial nerve deficit or sensory deficit. Gait normal.  Skin: Skin is warm and dry.  Psychiatric: She has a normal mood and affect. Her behavior is normal. Judgment and thought content normal.   ED Course  Procedures  DIAGNOSTIC STUDIES: Oxygen Saturation is 100% on RA, normal by my interpretation.    COORDINATION OF CARE: 6:59 PM-Discussed treatment plan which includes CBC, Pregnancy test, UA, and labs on blood. Pt agreed to plan.   Labs Review Labs Reviewed  CBC WITH DIFFERENTIAL - Abnormal; Notable for the following:    WBC 12.2 (*)    Lymphs Abs 4.6 (*)    All other components within normal limits  URINALYSIS, ROUTINE W REFLEX MICROSCOPIC - Abnormal; Notable for the following:    APPearance  CLOUDY (*)    Ketones, ur 15 (*)    All other components within normal limits  PREGNANCY, URINE  COMPREHENSIVE METABOLIC PANEL  LIPASE, BLOOD   Imaging Review No results found.  EKG Interpretation   None       MDM  No diagnosis found. Patient with ongoing abdominal pain after two recent evaluations with normal pelvic ultrasound 5 days ago and diagnosed with h. pylorin on Tuesday.  Reports ongoing nausea, vomiting and diarrhea. Patient with elevated wbc to 16,000 last evaluation here 5 days ago, now decreased to 12,000.  Patient appears well hydrated on physical exam with normal electrolytes, but does have ketones n urine.  Patient hydrated here with ns, no vomiting- zofran given.  Patient with mild diffuse ttp.  Advised close follow up with pmd.  She is given return precautions, zofran, and clear liquid diet for 12-24 hours.     [name] Quarry, MD 07/26/13 2006

## 2013-07-26 NOTE — ED Notes (Signed)
Pt called out reporting that she does not want to complete fluids, would like dispo.

## 2013-07-26 NOTE — ED Notes (Signed)
Pt seen here on 9th for  abd pain with admission here today for same

## 2013-09-11 ENCOUNTER — Encounter: Payer: Self-pay | Admitting: Internal Medicine

## 2013-09-12 NOTE — L&D Delivery Note (Signed)
Delivery Note: Called to a vaginal delivery of a 33 y.o. female G3P1011 with IUP at [redacted]w[redacted]d presenting for ROM, clear fluid. Mom is GBS negative. No resuscitation required. Infant was vigorous and crying upon delivery. Mild retractions noted on exam; however oxygen saturations remained > 93% at 7 minutes of age and infant's color WNL. Apgars 9/9. Infant to remain with mom for skin-to-skin care. Ferol Luz NNP-BC   Delivery Note At 6:40 PM a viable female was delivered via Vaginal, Spontaneous Delivery (Presentation: ;  ).  APGAR: 9, 9; weight 4 lb 14 oz (2211 g).   Placenta status: Intact, Spontaneous.  Cord: 3 vessels with the following complications: None.  Anesthesia: Epidural  Episiotomy: None Lacerations: Labial Suture Repair: 3.0 Est. Blood Loss (mL): 150  Mom to postpartum.  Baby to Couplet care / Skin to Skin, baby subsequently sent to NICU.  [name] [name] 05/06/2014, 10:38 PM

## 2013-09-24 ENCOUNTER — Encounter: Payer: Self-pay | Admitting: Internal Medicine

## 2013-09-24 ENCOUNTER — Ambulatory Visit (INDEPENDENT_AMBULATORY_CARE_PROVIDER_SITE_OTHER): Payer: Medicaid Other | Admitting: Internal Medicine

## 2013-09-24 VITALS — BP 110/60 | HR 88 | Ht 66.0 in | Wt 166.0 lb

## 2013-09-24 DIAGNOSIS — R1013 Epigastric pain: Secondary | ICD-10-CM

## 2013-09-24 DIAGNOSIS — R112 Nausea with vomiting, unspecified: Secondary | ICD-10-CM

## 2013-09-24 MED ORDER — DICYCLOMINE HCL 20 MG PO TABS: 20.0000 mg | ORAL_TABLET | Freq: Three times a day (TID) | ORAL | Status: DC

## 2013-09-24 NOTE — Patient Instructions (Addendum)
You have been scheduled for an endoscopy with propofol. Please follow written instructions given to you at your visit today. If you use inhalers (even only as needed), please bring them with you on the day of your procedure. Your physician has requested that you go to www.startemmi.com and enter the access code given to you at your visit today. This web site gives a general overview about your procedure. However, you should still follow specific instructions given to you by our office regarding your preparation for the procedure.  We have sent the following medications to your pharmacy for you to pick up at your convenience: Generic Bentyl  You have been scheduled [facility] an abdominal [facility]1st floor of hospital) on 10/04/13 at 9:00am. Please arrive 15 minutes prior to your appointment for registration. Make certain not to have anything to eat or drink 6 hours prior to your appointment. Should you need to reschedule your appointment, please contact radiology at [phone]. This test typically takes about 30 minutes to perform.  I appreciate the opportunity to care for you.

## 2013-09-24 NOTE — Progress Notes (Signed)
Subjective:    Patient ID: [name] [name], female    DOB: Jul 22, 1981, 33 y.o.   MRN: [id number]  HPI Patient is a very nice young woman with two-month history of chronic epigastric pain associated nausea and intermittent vomiting. She's not had problems like this before. She has been to the emergency department in November, where CT scanning of the abdomen and pelvis and pelvic ultrasound have been unrevealing. She describes a fairly dull but sometimes severe epigastric pain without radiation. She has frequent nausea in the morning and after eating some breakfast will often vomit. That has been variable. There has been some weight loss. She was evaluated and apparently had a positive H. pylori serology and was treated with amoxicillin Biaxin and PPI. She also relates getting an injection of antibiotics and her primary care office. She has chronic headaches using Fioricet, but denies use of anti-inflammatory agents or salicylates.   No Known Allergies Outpatient Prescriptions Prior to Visit  Medication Sig Dispense Refill  . calcium carbonate (TUMS - DOSED IN MG ELEMENTAL CALCIUM) 500 MG chewable tablet Chew 6-8 tablets by mouth as needed for indigestion or heartburn.       Marland Kitchen omeprazole (PRILOSEC) 20 MG capsule Take 1 capsule (20 mg total) by mouth daily.  30 capsule  1  . ondansetron (ZOFRAN ODT) 8 MG disintegrating tablet Take 1 tablet (8 mg total) by mouth every 8 (eight) hours as needed for nausea or vomiting.  20 tablet  0  . amoxicillin (AMOXIL) 500 MG capsule Take 1,000 mg by mouth 2 (two) times daily.      . Aspirin-Acetaminophen-Caffeine (GOODY HEADACHE PO) Take 1 packet by mouth as needed (headache).       . metroNIDAZOLE (FLAGYL) 500 MG tablet Take 500 mg by mouth 3 (three) times daily.       No facility-administered medications prior to visit.   Past Medical History  Diagnosis Date  . Narcotic abuse   . Anxiety   . Depression   . Chronic headaches   . H. pylori infection     Past Surgical History  Procedure Laterality Date  . Abortions    . Wisdom tooth extraction     History   Social History  . Marital Status: Single    Spouse Name: N/A    Number of Children: N/A  . Years of Education: N/A   Social History Main Topics  . Smoking status: Current Every Day Smoker -- 2.00 packs/day for 15 years    Types: Cigarettes  . Smokeless tobacco: Never Used  . Alcohol Use: No     Comment: recovering addict  . Drug Use: No     Comment: recovering addict  . Sexual Activity: No     Comment: over two yrs ago   Other Topics Concern  . None   Social History Narrative  .  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 Diabetes Mother   . Cancer Maternal Aunt   . Cancer Maternal Uncle   . Cancer Paternal Aunt   . Cancer Paternal Uncle         Review of Systems As per history of present illness, some anxiety and back pain. All other review of systems are negative.    Objective:   Physical Exam General:  Well-developed, well-nourished and in no acute distress Eyes:  anicteric. ENT:   Mouth and posterior pharynx free  of lesions.  Neck:   supple w/o thyromegaly or mass.  Lungs: Clear to auscultation bilaterally. Heart:  S1S2, no rubs, murmurs, gallops. Abdomen:  soft, tender in epigastrium, moderate, no hepatosplenomegaly, hernia, or mass and BS+.  Lymph:  no cervical or supraclavicular adenopathy. Extremities:   no edema Skin   no rash. + tattoo low back Neuro:  A&O x 3.  Psych:  appropriate mood and  Affect.   Data Reviewed: Emergency room imaging and notes. [facility] labs showing normal CBC except for white count 12,000 and December 16, triglycerides 183 TSH normal.     Assessment & Plan:   1. Abdominal pain, epigastric   2. Nausea and vomiting    Will schedule for ultrasound and EGD. Gallbladder issue still possible a CT scan we'll not rule those out. I think she could have a severe  gastritis, she could have an ulcer despite PPI therapy. Unlikely but possible. She has symptoms despite PPI so an upper endoscopy is reasonable as well. We'll treat with dicyclomine at this point.The risks and benefits as well as alternatives of endoscopic procedure(s) have been discussed and reviewed. All questions answered. The patient agrees to proceed.  I appreciate the opportunity to care for this patient. CC: Jearld LeschDwight Williams M.D.

## 2013-10-01 ENCOUNTER — Telehealth: Payer: Self-pay | Admitting: Internal Medicine

## 2013-10-01 NOTE — Telephone Encounter (Signed)
In speaking with patient she now said she has done two home pregnancy test and both are positive today.  Please advise if ok to do EGD 10/03/13.  Thank you Sir.

## 2013-10-01 NOTE — Telephone Encounter (Signed)
Cancel EGD Await OB evaluation May have nausea and vomiting from the pregnancy

## 2013-10-01 NOTE — Telephone Encounter (Signed)
Left detailed message since our phone off already here.  Will cancel EGD, await OB eval.  Positive pregnancy test may be cause of nausea and vomiting per Dr Leone PayorGessner.

## 2013-10-01 NOTE — Telephone Encounter (Signed)
WBC was 12.2 on 07/26/13, her EGD is on the books for 10/03/13.

## 2013-10-01 NOTE — Telephone Encounter (Signed)
Not a problem to do EGD

## 2013-10-03 ENCOUNTER — Encounter: Payer: Medicaid Other | Admitting: Internal Medicine

## 2013-10-04 ENCOUNTER — Encounter (HOSPITAL_COMMUNITY): Payer: Self-pay | Admitting: Emergency Medicine

## 2013-10-04 ENCOUNTER — Emergency Department (HOSPITAL_COMMUNITY)
Admission: EM | Admit: 2013-10-04 | Discharge: 2013-10-04 | Disposition: A | Discharge status: Home / Self Care | Payer: Medicaid Other | Attending: Emergency Medicine | Admitting: Emergency Medicine

## 2013-10-04 ENCOUNTER — Ambulatory Visit (HOSPITAL_COMMUNITY)
Admission: RE | Admit: 2013-10-04 | Discharge: 2013-10-04 | Disposition: A | Discharge status: Home / Self Care | Payer: Medicaid Other | Source: Ambulatory Visit | Attending: Internal Medicine | Admitting: Internal Medicine

## 2013-10-04 DIAGNOSIS — G8929 Other chronic pain: Secondary | ICD-10-CM | POA: Insufficient documentation

## 2013-10-04 DIAGNOSIS — Z3490 Encounter for supervision of normal pregnancy, unspecified, unspecified trimester: Secondary | ICD-10-CM

## 2013-10-04 DIAGNOSIS — Z79899 Other long term (current) drug therapy: Secondary | ICD-10-CM | POA: Insufficient documentation

## 2013-10-04 DIAGNOSIS — O9989 Other specified diseases and conditions complicating pregnancy, childbirth and the puerperium: Secondary | ICD-10-CM | POA: Insufficient documentation

## 2013-10-04 DIAGNOSIS — Z8659 Personal history of other mental and behavioral disorders: Secondary | ICD-10-CM | POA: Insufficient documentation

## 2013-10-04 DIAGNOSIS — Z8619 Personal history of other infectious and parasitic diseases: Secondary | ICD-10-CM | POA: Insufficient documentation

## 2013-10-04 DIAGNOSIS — R112 Nausea with vomiting, unspecified: Secondary | ICD-10-CM

## 2013-10-04 DIAGNOSIS — O99330 Smoking (tobacco) complicating pregnancy, unspecified trimester: Secondary | ICD-10-CM | POA: Insufficient documentation

## 2013-10-04 DIAGNOSIS — R1013 Epigastric pain: Secondary | ICD-10-CM

## 2013-10-04 LAB — HCG, QUANTITATIVE, PREGNANCY: hCG, Beta Chain, Quant, S: 7329 m[IU]/mL — ABNORMAL HIGH (ref <5)

## 2013-10-04 MED ORDER — PRENATAL 27-0.8 MG PO TABS: 1.0000 | ORAL_TABLET | Freq: Every day | ORAL | Status: DC

## 2013-10-04 NOTE — Discharge Instructions (Signed)

## 2013-10-04 NOTE — ED Notes (Signed)
Per pt, tested positive for H-pylori in Nov-abdominal pain on and off since-has US done today related to continued pain-was suppose to get scoped but she has taken 3 pregnancy tests that have resulted positive-needs blood test to confirm pregnancy

## 2013-10-04 NOTE — ED Provider Notes (Signed)
CSN: [id number]     Arrival date & time 10/04/13  1006 History  This chart was scribed for non-physician practitioner, Arthor Captain, PA-C working with Enid Skeens, MD by Greggory Stallion, ED scribe. This patient was seen in room WTR7/WTR7 and the patient's care was started at 12:17 PM.   Chief Complaint  Patient presents with  . Abdominal Pain   The history is provided by the patient. No language interpreter was used.   HPI Comments: [name] [name] is a 33 y.o. female who presents to the Emergency Department for a pregnancy test. Pt has chronic abdominal pain and tested positive for H-pylori in November 2014. Pt had an ultrasound done today related to it and was supposed to get a scope done. She couldn't because she has taken 3 pregnancy tests that have all resulted positive. Pt states she needs a blood test to confirm. She does not have an OB-GYN right now. Denies vaginal bleeding or abdominal pain currently.   Past Medical History  Diagnosis Date  . Narcotic abuse   . Anxiety   . Depression   . Chronic headaches   . H. pylori infection    Past Surgical History  Procedure Laterality Date  . Abortions    . Wisdom tooth extraction     Family History  Problem Relation Age of Onset  . Diabetes Mother   . Cancer Maternal Aunt   . Cancer Maternal Uncle   . Cancer Paternal Aunt   . Cancer Paternal Uncle    History  Substance Use Topics  . Smoking status: Current Every Day Smoker -- 2.00 packs/day for 15 years    Types: Cigarettes  . Smokeless tobacco: Never Used  . Alcohol Use: No     Comment: recovering addict   OB History   Grav Para Term Preterm Abortions TAB SAB Ect Mult Living   3 1   2 2    1      Review of Systems  Constitutional: Negative for fever.  HENT: Negative for congestion.   Respiratory: Negative for cough.   Gastrointestinal: Negative for abdominal pain.  Genitourinary: Negative for vaginal bleeding.  Skin: Negative for rash.    Allergies  Review  of patient's allergies indicates no known allergies.  Home Medications   Current Outpatient Rx  Name  Route  Sig  Dispense  Refill  . Butalbital-APAP-Caffeine (FIORICET PO)   Oral   Take 1 tablet by mouth 3 (three) times daily.         . calcium carbonate (TUMS - DOSED IN MG ELEMENTAL CALCIUM) 500 MG chewable tablet   Oral   Chew 6-8 tablets by mouth as needed for indigestion or heartburn.          . dicyclomine (BENTYL) 20 MG tablet   Oral   Take 1 tablet (20 mg total) by mouth 4 (four) times daily -  before meals and at bedtime.   120 tablet   0   . omeprazole (PRILOSEC) 20 MG capsule   Oral   Take 1 capsule (20 mg total) by mouth daily.   30 capsule   1   . ondansetron (ZOFRAN ODT) 8 MG disintegrating tablet   Oral   Take 1 tablet (8 mg total) by mouth every 8 (eight) hours as needed for nausea or vomiting.   20 tablet   0   . sucralfate (CARAFATE) 1 G tablet   Oral   Take 1 g by mouth 4 (four) times daily -  with meals and at bedtime.          LMP 08/27/2013  Physical Exam  Nursing note and vitals reviewed. Constitutional: She is oriented to person, place, and time. She appears well-developed and well-nourished. No distress.  HENT:  Head: Normocephalic and atraumatic.  Eyes: EOM are normal.  Neck: Neck supple. No tracheal deviation present.  Cardiovascular: Normal rate.   Pulmonary/Chest: Effort normal. No respiratory distress.  Musculoskeletal: Normal range of motion.  Neurological: She is alert and oriented to person, place, and time.  Skin: Skin is warm and dry.  Psychiatric: She has a normal mood and affect. Her behavior is normal.    ED Course  Procedures (including critical care time)  DIAGNOSTIC STUDIES: Oxygen Saturation is 100% on RA, normal by my interpretation.    COORDINATION OF CARE: 12:18 PM-Discussed treatment plan which includes starting prenatal vitamins and following up with an OB-GYN with pt at bedside and pt agreed to plan.    Labs Review Labs Reviewed  HCG, QUANTITATIVE, PREGNANCY - Abnormal; Notable for the following:    hCG, Beta Chain, Quant, S 7329 (*)    All other components within normal limits   Imaging Review [place] Abdomen Complete  10/04/2013   CLINICAL DATA:  Epigastric pain.  EXAM: ULTRASOUND ABDOMEN COMPLETE  COMPARISON:  CT scan of the abdomen dated July 21, 2013.  FINDINGS: Gallbladder:  The gallbladder is adequately distended and contains echogenic mobile shadowing stones. There also adherent intramural shadowing foci which may reflect mural calcifications or calcifications within polyps. There is no gallbladder wall thickening or pericholecystic fluid nor positive sonographic Murphy's sign.  Common bile duct:  Diameter: 2.9 mm  Liver:  No focal lesion identified. Within normal limits in parenchymal echogenicity.  IVC:  No abnormality visualized.  Pancreas:  Visualized portion unremarkable.  Spleen:  Size and appearance within normal limits.  Right Kidney:  Length: 11.5 cm. Echogenicity within normal limits. No mass or hydronephrosis visualized.  Left Kidney:  Length: 11.0 cm. Echogenicity within normal limits. No mass or hydronephrosis visualized.  Abdominal aorta:  No aneurysm visualized.  Other findings:  No ascites is demonstrated.  IMPRESSION: 1. There are small mobile gallstones as well as echogenic nonmobile foci which may reflect adherent non mobile stones, polyps, or mural calcification. There is no sonographic evidence of acute cholecystitis. 2. Otherwise the examination is within the limits of normal.   Electronically Signed   By: David  SwazilandJordan   On: 10/04/2013 10:33    EKG Interpretation   None       MDM   1. Pregnant    Patient with preg est 6680w3d by LMP. B-HCG range consistent with EGA No abdominal pain, vag bleeding. Came for serum HCG confirmation only. I [facility] and talked [facility] as there was miscommunication. patietn may follow upthere as long as she has pcp  referral. Discussed return precautions a d reviewed meds. Begin prenatal vitamins. Discussed smoking cessation.   I personally performed the services described in this documentation, which was scribed in my presence. The recorded information has been reviewed and is accurate.    Arthor CaptainAbigail Johnatan Baskette, PA-C 10/04/13 1325

## 2013-10-04 NOTE — ED Provider Notes (Signed)
Medical screening examination/treatment/procedure(s) were performed by non-physician practitioner and as supervising physician I was immediately available for consultation/collaboration.  EKG Interpretation   None         Vicki Pasqual M Khali Perella, MD 10/04/13 1604 

## 2013-10-04 NOTE — Progress Notes (Signed)
Quick Note:  She has gallstones - may be cause of some of her pain She is waiting to see OB as she is newly pregnant so EGD cancelled Further plans pending what OB tells her re meds, etc ______

## 2013-10-08 ENCOUNTER — Encounter (HOSPITAL_COMMUNITY): Payer: Self-pay | Admitting: *Deleted

## 2013-10-08 ENCOUNTER — Inpatient Hospital Stay (HOSPITAL_COMMUNITY)
Admission: AD | Admit: 2013-10-08 | Discharge: 2013-10-08 | Disposition: A | Discharge status: Home / Self Care | Payer: Medicaid Other | Source: Ambulatory Visit | Attending: Obstetrics & Gynecology | Admitting: Obstetrics & Gynecology

## 2013-10-08 DIAGNOSIS — R1013 Epigastric pain: Secondary | ICD-10-CM | POA: Insufficient documentation

## 2013-10-08 DIAGNOSIS — O9989 Other specified diseases and conditions complicating pregnancy, childbirth and the puerperium: Secondary | ICD-10-CM | POA: Insufficient documentation

## 2013-10-08 DIAGNOSIS — K8020 Calculus of gallbladder without cholecystitis without obstruction: Secondary | ICD-10-CM

## 2013-10-08 DIAGNOSIS — F17200 Nicotine dependence, unspecified, uncomplicated: Secondary | ICD-10-CM | POA: Insufficient documentation

## 2013-10-08 MED ORDER — BUTALBITAL-APAP-CAFFEINE 50-325-40 MG PO TABS: 1.0000 | ORAL_TABLET | Freq: Three times a day (TID) | ORAL | Status: DC

## 2013-10-08 MED ORDER — ONDANSETRON 8 MG PO TBDP: 8.0000 mg | ORAL_TABLET | Freq: Three times a day (TID) | ORAL | Status: DC | PRN

## 2013-10-08 NOTE — Discharge Instructions (Signed)
Cholelithiasis Cholelithiasis (also called gallstones) is a form of gallbladder disease in which gallstones form in your gallbladder. The gallbladder is an organ that stores bile made in the liver, which helps digest fats. Gallstones begin as small crystals and slowly grow into stones. Gallstone pain occurs when the gallbladder spasms and a gallstone is blocking the duct. Pain can also occur when a stone passes out of the duct.  RISK FACTORS  Being female.   Having multiple pregnancies. Health care providers sometimes advise removing diseased gallbladders before future pregnancies.   Being obese.  Eating a diet heavy in fried foods and fat.   Being older than 60 years and increasing age.   Prolonged use of medicines containing female hormones.   Having diabetes mellitus.   Rapidly losing weight.   Having a family history of gallstones (heredity).  SYMPTOMS  Nausea.   Vomiting.  Abdominal pain.   Yellowing of the skin (jaundice).   Sudden pain. It may persist from several minutes to several hours.  Fever.   Tenderness to the touch. In some cases, when gallstones do not move into the bile duct, people have no pain or symptoms. These are called "silent" gallstones.  TREATMENT Silent gallstones do not need treatment. In severe cases, emergency surgery may be required. Options for treatment include:  Surgery to remove the gallbladder. This is the most common treatment.  Medicines. These do not always work and may take 6 12 months or more to work.  Shock wave treatment (extracorporeal biliary lithotripsy). In this treatment an ultrasound machine sends shock waves to the gallbladder to break gallstones into smaller pieces that can pass into the intestines or be dissolved by medicine. HOME CARE INSTRUCTIONS   Only take over-the-counter or prescription medicines for pain, discomfort, or fever as directed by your health care provider.   Follow a low-fat diet until  seen again by your health care provider. Fat causes the gallbladder to contract, which can result in pain.   Follow up with your health care provider as directed. Attacks are almost always recurrent and surgery is usually required for permanent treatment.  SEEK IMMEDIATE MEDICAL CARE IF:   Your pain increases and is not controlled by medicines.   You have a fever or persistent symptoms for more than 2 3 days.   You have a fever and your symptoms suddenly get worse.   You have persistent nausea and vomiting.  MAKE SURE YOU:   Understand these instructions.  Will watch your condition.  Will get help right away if you are not doing well or get worse. Document Released: 08/25/2005 Document Revised: 05/01/2013 Document Reviewed: 02/20/2013 [facility] Patient Information 2014 [place], [place].   For assistance with applying for medicaid please call [name] SkillernReyna [name] [phone] or [name] HahnJuanita [name] [phone].

## 2013-10-08 NOTE — MAU Provider Note (Signed)
History     CSN: [id number]  Arrival date and time: 10/08/13 1747   First Provider Initiated Contact with Patient 10/08/13 1959      Chief Complaint  Patient presents with  . Abdominal Pain   HPI Ms. [name] [name] is a 33 y.o. O1H0865 at [redacted]w[redacted]d who presents to MAU today with epigastric pain. The patient states that she has had issues with this pain x 4 months. She was recently diagnosed with gallstones. She was on Bentyl, Carafate and Prilosec but stopped all medications when she found out she was pregnant. Patient also states that she was supposed to have upper GI performed by Dr. Leone Payor this week, but it was postponed due to pregnancy. The patient states that she has constant pain with sharp pains that come and go as well. She also endorses N/V without diarrhea or constipation. She denies vaginal bleeding, abnormal discharge or fever. She rates her pain at 10/10 now. She has a PCP and GI specialist with [place].   OB History   Grav Para Term Preterm Abortions TAB SAB Ect Mult Living   3 0   2 2    1       Past Medical History  Diagnosis Date  . Narcotic abuse   . Anxiety   . Depression   . Chronic headaches   . H. pylori infection     Past Surgical History  Procedure Laterality Date  . Abortions    . Wisdom tooth extraction      Family History  Problem Relation Age of Onset  . Diabetes Mother   . Cancer Maternal Aunt   . Cancer Maternal Uncle   . Cancer Paternal Aunt   . Cancer Paternal Uncle     History  Substance Use Topics  . Smoking status: Current Every Day Smoker -- 2.00 packs/day for 15 years    Types: Cigarettes  . Smokeless tobacco: Never Used  . Alcohol Use: No     Comment: recovering addict    Allergies: No Known Allergies  No prescriptions prior to admission    Review of Systems  Constitutional: Negative for fever and malaise/fatigue.  Gastrointestinal: Positive for nausea, vomiting and abdominal pain. Negative for diarrhea and constipation.   Genitourinary: Negative for dysuria, urgency and frequency.       Neg - vaginal bleeding, abnormal discharge   Physical Exam   Blood pressure 109/66, pulse 81, temperature 98.4 F (36.9 C), temperature source Oral, resp. rate 18, height 5\' 5"  (1.651 m), weight 160 lb (72.576 kg), last menstrual period 08/27/2013.  Physical Exam  Constitutional: She is oriented to person, place, and time. She appears well-developed and well-nourished. No distress.  HENT:  Head: Normocephalic and atraumatic.  Cardiovascular: Normal rate, regular rhythm and normal heart sounds.   Respiratory: Effort normal and breath sounds normal. No respiratory distress.  GI: Soft. Bowel sounds are normal. She exhibits no distension and no mass. There is tenderness (moderate tenderness to palpation of the upper abdomen). There is no rebound and no guarding.  Neurological: She is alert and oriented to person, place, and time.  Skin: Skin is warm and dry. No erythema.  Psychiatric: She has a normal mood and affect.    MAU Course  Procedures None  MDM Patient states that her pain was much better while on 3 medication regimen.  All medications are ok to take in pregnancy. Patient advised of this. Patient requested refill of Fioricet.   Assessment and Plan  A: Gallstones Epigastric pain,  chronic  P: Discharge home Rx for Fioricet and Zofran given to patient Patient advised to restart Carafate, Prilosec and Bentyl [facility] They will call her with an appointment.  Patient given pregnancy confirmation letter and Medicaid assistance information Patient advised to follow-up with PCP or GI specialist for continued care of epigastric pain Patient may return to MAU as needed or if her condition were to change or worsen  Freddi StarrJulie N Ethier, PA-C  10/08/2013, 8:40 PM

## 2013-10-08 NOTE — MAU Note (Signed)
Last Tues  Had 2 +HPT.  Had US of upper abd on Friday- was dx with gallstones.  preg was confirmed.  Pain just will not stop.

## 2013-10-14 ENCOUNTER — Telehealth: Payer: Self-pay | Admitting: Internal Medicine

## 2013-10-14 DIAGNOSIS — K8020 Calculus of gallbladder without cholecystitis without obstruction: Secondary | ICD-10-CM

## 2013-10-14 NOTE — Telephone Encounter (Signed)
Please refer to general surgery re: symptomatic cholelithisasis Needs low fat diet

## 2013-10-14 NOTE — Telephone Encounter (Signed)
Spoke with Liborio NixonJanice and since [place] her PCP will need to send a referral before she can be scheduled. Patient notified and she will have her PCP refer her.

## 2013-10-14 NOTE — Telephone Encounter (Signed)
Patient states she does not have a [facility]. Left a message for Zacarias PontesJanice Wyrick at [facility].

## 2013-10-14 NOTE — Telephone Encounter (Signed)
Patient is pregnant and has gallstones. She was told to see her OB/GYN then a decision would be made about her gallstones. She is calling to report she cannot see a OB doctor until March 10th.  She [facility] since [facility] here. She states her abdominal pain is getting worse and wants to know what she should do. Please, advise.

## 2013-10-15 NOTE — Telephone Encounter (Signed)
Spoke with patient and gave appointment

## 2013-10-15 NOTE — Telephone Encounter (Signed)
Spoke with [name] [name] at [facility] and scheduled patient on 10/22/13 at 10:30 AM with Dr. Harlon Florseui. Patient notified.

## 2013-10-16 ENCOUNTER — Encounter (HOSPITAL_COMMUNITY): Payer: Self-pay | Admitting: *Deleted

## 2013-10-16 ENCOUNTER — Inpatient Hospital Stay (HOSPITAL_COMMUNITY)
Admission: AD | Admit: 2013-10-16 | Discharge: 2013-10-16 | Disposition: A | Discharge status: Home / Self Care | Payer: Medicaid Other | Source: Ambulatory Visit | Attending: Obstetrics and Gynecology | Admitting: Obstetrics and Gynecology

## 2013-10-16 ENCOUNTER — Inpatient Hospital Stay (HOSPITAL_COMMUNITY): Payer: Medicaid Other

## 2013-10-16 DIAGNOSIS — O210 Mild hyperemesis gravidarum: Secondary | ICD-10-CM | POA: Insufficient documentation

## 2013-10-16 DIAGNOSIS — R1013 Epigastric pain: Secondary | ICD-10-CM | POA: Insufficient documentation

## 2013-10-16 DIAGNOSIS — K8020 Calculus of gallbladder without cholecystitis without obstruction: Secondary | ICD-10-CM

## 2013-10-16 DIAGNOSIS — R1011 Right upper quadrant pain: Secondary | ICD-10-CM | POA: Insufficient documentation

## 2013-10-16 DIAGNOSIS — O9989 Other specified diseases and conditions complicating pregnancy, childbirth and the puerperium: Secondary | ICD-10-CM | POA: Insufficient documentation

## 2013-10-16 DIAGNOSIS — O99330 Smoking (tobacco) complicating pregnancy, unspecified trimester: Secondary | ICD-10-CM | POA: Insufficient documentation

## 2013-10-16 LAB — COMPREHENSIVE METABOLIC PANEL
ALBUMIN: 3.9 g/dL (ref 3.5–5.2)
ALK PHOS: 79 U/L (ref 39–117)
ALT: 11 U/L (ref 0–35)
AST: 14 U/L (ref 0–37)
BUN: 8 mg/dL (ref 6–23)
CHLORIDE: 104 meq/L (ref 96–112)
CO2: 21 mEq/L (ref 19–32)
Calcium: 9.3 mg/dL (ref 8.4–10.5)
Creatinine, Ser: 0.57 mg/dL (ref 0.50–1.10)
GFR calc Af Amer: >90 mL/min (ref >90)
GFR calc non Af Amer: >90 mL/min (ref >90)
Glucose, Bld: 92 mg/dL (ref 70–99)
Potassium: 3.8 mEq/L (ref 3.7–5.3)
SODIUM: 139 meq/L (ref 137–147)
Total Protein: 7 g/dL (ref 6.0–8.3)

## 2013-10-16 LAB — AMYLASE: Amylase: 42 U/L (ref 0–105)

## 2013-10-16 LAB — CBC
HCT: 39.3 % (ref 36.0–46.0)
Hemoglobin: 13 g/dL (ref 12.0–15.0)
MCH: 27.8 pg (ref 26.0–34.0)
MCHC: 33.1 g/dL (ref 30.0–36.0)
MCV: 84.2 fL (ref 78.0–100.0)
PLATELETS: 300 10*3/uL (ref 150–400)
RBC: 4.67 MIL/uL (ref 3.87–5.11)
RDW: 15 % (ref 11.5–15.5)
WBC: 10.8 10*3/uL — ABNORMAL HIGH (ref 4.0–10.5)

## 2013-10-16 LAB — LIPASE, BLOOD: Lipase: 29 U/L (ref 11–59)

## 2013-10-16 MED ORDER — HYDROCODONE-ACETAMINOPHEN 5-325 MG PO TABS: 1.0000 | ORAL_TABLET | Freq: Four times a day (QID) | ORAL | Status: DC | PRN

## 2013-10-16 MED ORDER — HYDROCODONE-ACETAMINOPHEN 5-325 MG PO TABS: 2.0000 | ORAL_TABLET | Freq: Once | ORAL | Status: DC

## 2013-10-16 MED ORDER — HYDROMORPHONE HCL PF 1 MG/ML IJ SOLN
1.0000 mg | Freq: Once | INTRAMUSCULAR | Status: AC
  Administered 2013-10-16: 1 mg via INTRAMUSCULAR
  Filled 2013-10-16: qty 1

## 2013-10-16 NOTE — Discharge Instructions (Signed)
Cholelithiasis °Cholelithiasis (also called gallstones) is a form of gallbladder disease in which gallstones form in your gallbladder. The gallbladder is an organ that stores bile made in the liver, which helps digest fats. Gallstones begin as small crystals and slowly grow into stones. Gallstone pain occurs when the gallbladder spasms and a gallstone is blocking the duct. Pain can also occur when a stone passes out of the duct.  °RISK FACTORS °· Being female.   °· Having multiple pregnancies. Health care providers sometimes advise removing diseased gallbladders before future pregnancies.   °· Being obese. °· Eating a diet heavy in fried foods and fat.   °· Being older than 60 years and increasing age.   °· Prolonged use of medicines containing female hormones.   °· Having diabetes mellitus.   °· Rapidly losing weight.   °· Having a family history of gallstones (heredity).   °SYMPTOMS °· Nausea.   °· Vomiting. °· Abdominal pain.   °· Yellowing of the skin (jaundice).   °· Sudden pain. It may persist from several minutes to several hours. °· Fever.   °· Tenderness to the touch.  °In some cases, when gallstones do not move into the bile duct, people have no pain or symptoms. These are called "silent" gallstones.  °TREATMENT °Silent gallstones do not need treatment. In severe cases, emergency surgery may be required. Options for treatment include: °· Surgery to remove the gallbladder. This is the most common treatment. °· Medicines. These do not always work and may take 6 12 months or more to work. °· Shock wave treatment (extracorporeal biliary lithotripsy). In this treatment an ultrasound machine sends shock waves to the gallbladder to break gallstones into smaller pieces that can pass into the intestines or be dissolved by medicine. °HOME CARE INSTRUCTIONS  °· Only take over-the-counter or prescription medicines for pain, discomfort, or fever as directed by your health care provider.   °· Follow a low-fat diet until  seen again by your health care provider. Fat causes the gallbladder to contract, which can result in pain.   °· Follow up with your health care provider as directed. Attacks are almost always recurrent and surgery is usually required for permanent treatment.   °SEEK IMMEDIATE MEDICAL CARE IF:  °· Your pain increases and is not controlled by medicines.   °· You have a fever or persistent symptoms for more than 2 3 days.   °· You have a fever and your symptoms suddenly get worse.   °· You have persistent nausea and vomiting.   °MAKE SURE YOU:  °· Understand these instructions. °· Will watch your condition. °· Will get help right away if you are not doing well or get worse. °Document Released: 08/25/2005 Document Revised: 05/01/2013 Document Reviewed: 02/20/2013 °ExitCare® Patient Information ©2014 Exit[employer], LLC. ° °

## 2013-10-16 NOTE — MAU Provider Note (Signed)
History     CSN: [id number]  Arrival date and time: 10/16/13 1412   First Provider Initiated Contact with Patient 10/16/13 1426      Chief Complaint  Patient presents with  . Abdominal Pain   HPI  [name] [name] is a 33 y.o. G3P0021 at [redacted]w[redacted]d who presents today with epigastric pain. This has been a chronic issue for her, and she has known gallstones. She states that at 0100 the pain became much worse. She called her GI doctor who told her to come here. She reports nausea and vomiting, but denies any fever or diarrhea. She last ate at 0900, and had 1/2 can of soda at 12:00. She denies any vaginal bleeding or pain.   Past Medical History  Diagnosis Date  . Narcotic abuse   . Anxiety   . Depression   . Chronic headaches   . H. pylori infection     Past Surgical History  Procedure Laterality Date  . Abortions    . Wisdom tooth extraction      Family History  Problem Relation Age of Onset  . Diabetes Mother   . Cancer Maternal Aunt   . Cancer Maternal Uncle   . Cancer Paternal Aunt   . Cancer Paternal Uncle     History  Substance Use Topics  . Smoking status: Current Every Day Smoker -- 2.00 packs/day for 15 years    Types: Cigarettes  . Smokeless tobacco: Never Used  . Alcohol Use: No     Comment: recovering addict    Allergies: No Known Allergies  Prescriptions prior to admission  Medication Sig Dispense Refill  . butalbital-acetaminophen-caffeine (FIORICET) 50-325-40 MG per tablet Take 1-2 tablets by mouth 3 (three) times daily.  20 tablet  0  . calcium carbonate (TUMS - DOSED IN MG ELEMENTAL CALCIUM) 500 MG chewable tablet Chew 6-8 tablets by mouth as needed for indigestion or heartburn.       . dicyclomine (BENTYL) 20 MG tablet Take 1 tablet (20 mg total) by mouth 4 (four) times daily -  before meals and at bedtime.  120 tablet  0  . omeprazole (PRILOSEC) 40 MG capsule Take 40 mg by mouth daily.      . ondansetron (ZOFRAN ODT) 8 MG disintegrating tablet Take 1  tablet (8 mg total) by mouth every 8 (eight) hours as needed for nausea or vomiting.  20 tablet  0  . Prenatal Vit-Fe Fumarate-FA (PRENATAL MULTIVITAMIN) TABS tablet Take 1 tablet by mouth daily at 12 noon.      . sucralfate (CARAFATE) 1 G tablet Take 1 g by mouth 4 (four) times daily -  with meals and at bedtime.        ROS Physical Exam   Blood pressure 110/59, pulse 93, temperature 98.3 F (36.8 C), temperature source Oral, resp. rate 20, height 5\' 5"  (1.651 m), weight 73.483 kg (162 lb), last menstrual period 08/27/2013.  Physical Exam  Nursing note and vitals reviewed. Constitutional: She is oriented to person, place, and time. She appears well-developed and well-nourished. No distress.  Cardiovascular: Normal rate.   Respiratory: Effort normal.  GI: Soft. There is tenderness (in RUQ).  Neurological: She is alert and oriented to person, place, and time.  Skin: Skin is warm and dry.  Psychiatric: She has a normal mood and affect.    MAU Course  Procedures  Results for orders placed during the hospital encounter of 10/16/13 (from the past 24 hour(s))  CBC     Status:  Abnormal   Collection Time    10/16/13  2:28 PM      Result Value Range   WBC 10.8 (*) 4.0 - 10.5 K/uL   RBC 4.67  3.87 - 5.11 MIL/uL   Hemoglobin 13.0  12.0 - 15.0 g/dL   HCT 96.039.3  45.436.0 - 09.846.0 %   MCV 84.2  78.0 - 100.0 fL   MCH 27.8  26.0 - 34.0 pg   MCHC 33.1  30.0 - 36.0 g/dL   RDW 11.915.0  14.711.5 - 82.915.5 %   Platelets 300  150 - 400 K/uL  COMPREHENSIVE METABOLIC PANEL     Status: Abnormal   Collection Time    10/16/13  2:28 PM      Result Value Range   Sodium 139  137 - 147 mEq/L   Potassium 3.8  3.7 - 5.3 mEq/L   Chloride 104  96 - 112 mEq/L   CO2 21  19 - 32 mEq/L   Glucose, Bld 92  70 - 99 mg/dL   BUN 8  6 - 23 mg/dL   Creatinine, Ser 5.620.57  0.50 - 1.10 mg/dL   Calcium 9.3  8.4 - 13.010.5 mg/dL   Total Protein 7.0  6.0 - 8.3 g/dL   Albumin 3.9  3.5 - 5.2 g/dL   AST 14  0 - 37 U/L   ALT 11  0 - 35 U/L    Alkaline Phosphatase 79  39 - 117 U/L   Total Bilirubin <0.2 (*) 0.3 - 1.2 mg/dL   GFR calc non Af Amer >90  >90 mL/min   GFR calc Af Amer >90  >90 mL/min  AMYLASE     Status: None   Collection Time    10/16/13  2:28 PM      Result Value Range   Amylase 42  0 - 105 U/L  LIPASE, BLOOD     Status: None   Collection Time    10/16/13  2:28 PM      Result Value Range   Lipase 29  11 - 59 U/L   [place] Abdomen Limited  10/16/2013   CLINICAL DATA:  Right upper quadrant pain  EXAM: US ABDOMEN LIMITED - RIGHT UPPER QUADRANT  COMPARISON:  October 04, 2013  FINDINGS: Gallbladder:  A single echogenic focus which moves in shadows is seen in the gallbladder. Note that fewer gallstones are seen at this time compared to recent prior study. There is no gallbladder wall thickening or pericholecystic fluid. Patient is mildly tender over the gallbladder.  Common bile duct:  Diameter: 3 mm. There is no intrahepatic or extrahepatic biliary duct dilatation.  Liver:  No focal lesion identified. Within normal limits in parenchymal echogenicity.  IMPRESSION: Cholelithiasis with fewer gallstones seen on the current study compared to recent prior study. Note that there is no biliary duct dilatation or biliary duct calculus seen, however. Patient mildly tender over the gallbladder. Study otherwise unremarkable.   Electronically Signed   By: Bretta BangWilliam  Woodruff M.D.   On: 10/16/2013 16:56     Assessment and Plan   1. Gallstones    Low fat diet Continue meds as prescribed FU with your surgeon as planned Call your GI doctor as needed [facility]   Tawnya CrookHogan, Heather Donovan 10/16/2013, 2:39 PM

## 2013-10-16 NOTE — MAU Note (Deleted)
Been having really bad cramping in lower abd and low back for past 2 wks., like menstrual cramping but worse.  Menstrual did not come on. Can't keep anything down. Has not done home test. Had some spotting 2 days ago, 1 time.

## 2013-10-16 NOTE — MAU Note (Signed)
Pain has been increasing, is a constant pain with sharp pains.  Woke her at 0100. Has surgical consult next wk.

## 2013-10-17 NOTE — MAU Provider Note (Signed)
Attestation of Attending Supervision of Advanced Practitioner (CNM/NP): Evaluation and management procedures were performed by the Advanced Practitioner under my supervision and collaboration.  I have reviewed the Advanced Practitioner's note and chart, and I agree with the management and plan.  Beretta Ginsberg 10/17/2013 8:34 AM   

## 2013-10-22 ENCOUNTER — Ambulatory Visit (INDEPENDENT_AMBULATORY_CARE_PROVIDER_SITE_OTHER): Payer: Medicaid Other | Admitting: Surgery

## 2013-10-22 ENCOUNTER — Encounter (INDEPENDENT_AMBULATORY_CARE_PROVIDER_SITE_OTHER): Payer: Self-pay | Admitting: Surgery

## 2013-10-22 VITALS — BP 124/72 | HR 80 | Temp 99.2°F | Resp 16 | Ht 66.0 in | Wt 162.4 lb

## 2013-10-22 DIAGNOSIS — K8010 Calculus of gallbladder with chronic cholecystitis without obstruction: Secondary | ICD-10-CM | POA: Insufficient documentation

## 2013-10-22 DIAGNOSIS — O0001 Abdominal pregnancy with intrauterine pregnancy: Secondary | ICD-10-CM

## 2013-10-22 MED ORDER — PROMETHAZINE HCL 12.5 MG PO TABS: 12.5000 mg | ORAL_TABLET | Freq: Four times a day (QID) | ORAL | Status: DC | PRN

## 2013-10-22 MED ORDER — HYDROCODONE-ACETAMINOPHEN 5-325 MG PO TABS: 1.0000 | ORAL_TABLET | Freq: Four times a day (QID) | ORAL | Status: DC | PRN

## 2013-10-22 NOTE — Progress Notes (Signed)
Patient ID: [name] [name], female   DOB: [birth year], 33 y.o.   MRN: [id number]  Chief Complaint  Patient presents with  . Abdominal Pain    Evaluate gallstones    HPI [name] PaulsJessica [name] is a 33 y.o. female.  Referred by Dr. Stan Headarl Gessner for gallstones Abdominal Pain Associated symptoms: constipation, diarrhea, nausea and vomiting   Associated symptoms: no chest pain, no chills, no cough, no fever, no hematuria, no sore throat and no vaginal bleeding    This is a [age] year old female who presents with a four-month history of upper abdominal pain associated with bloating, Nausea and vomiting. She had a positive H. Pylori blood tests and underwent treatment with amoxicillin, Biaxin, and PPI.She was evaluated by Dr. Leone PayorGessner on 09/24/13.  She underwent ultrasound but also subsequently discovered that she was pregnant. The ultrasound showed multiple gallstones.  Her previously scheduled EGD was canceled. She has not yet seen her OB for her first prenatal visit. This is not scheduled for another 3-1/2 weeks.  The patient describes constant upper abdominal pain that is worse on the right. Her symptoms seem to be exacerbated by eating any type of food. Her diarrhea has resolved and she is now constipated.  Past Medical History  Diagnosis Date  . Narcotic abuse   . Anxiety   . Depression   . Chronic headaches   . H. pylori infection     Past Surgical History  Procedure Laterality Date  . Abortions    . Wisdom tooth extraction      Family History  Problem Relation Age of Onset  . Diabetes Mother   . Cancer Maternal Aunt   . Cancer Maternal Uncle   . Cancer Paternal Aunt   . Cancer Paternal Uncle     Social History History  Substance Use Topics  . Smoking status: Current Every Day Smoker -- 2.00 packs/day for 15 years    Types: Cigarettes  . Smokeless tobacco: Never Used  . Alcohol Use: No     Comment: recovering addict    No Known Allergies  Current Outpatient Prescriptions   Medication Sig Dispense Refill  . butalbital-acetaminophen-caffeine (FIORICET) 50-325-40 MG per tablet Take 1-2 tablets by mouth 3 (three) times daily.  20 tablet  0  . calcium carbonate (TUMS - DOSED IN MG ELEMENTAL CALCIUM) 500 MG chewable tablet Chew 6-8 tablets by mouth as needed for indigestion or heartburn.       . dicyclomine (BENTYL) 20 MG tablet Take 1 tablet (20 mg total) by mouth 4 (four) times daily -  before meals and at bedtime.  120 tablet  0  . HYDROcodone-acetaminophen (NORCO/VICODIN) 5-325 MG per tablet Take 1 tablet by mouth every 6 (six) hours as needed for moderate pain.  40 tablet  0  . omeprazole (PRILOSEC) 40 MG capsule Take 40 mg by mouth daily.      . ondansetron (ZOFRAN ODT) 8 MG disintegrating tablet Take 1 tablet (8 mg total) by mouth every 8 (eight) hours as needed for nausea or vomiting.  20 tablet  0  . Prenatal Vit-Fe Fumarate-FA (PRENATAL MULTIVITAMIN) TABS tablet Take 1 tablet by mouth daily at 12 noon.      . sucralfate (CARAFATE) 1 G tablet Take 1 g by mouth 4 (four) times daily -  with meals and at bedtime.      . promethazine (PHENERGAN) 12.5 MG tablet Take 1 tablet (12.5 mg total) by mouth every 6 (six) hours as needed for nausea or vomiting.  30 tablet  0   No current facility-administered medications for this visit.    Review of Systems Review of Systems  Constitutional: Negative for fever, chills and unexpected weight change.  HENT: Negative for congestion, hearing loss, sore throat, trouble swallowing and voice change.   Eyes: Negative for visual disturbance.  Respiratory: Negative for cough and wheezing.   Cardiovascular: Negative for chest pain, palpitations and leg swelling.  Gastrointestinal: Positive for nausea, vomiting, abdominal pain, diarrhea, constipation and abdominal distention. Negative for blood in stool and anal bleeding.  Genitourinary: Negative for hematuria, vaginal bleeding and difficulty urinating.  Musculoskeletal: Negative for  arthralgias.  Skin: Negative for rash and wound.  Neurological: Negative for seizures, syncope and headaches.  Hematological: Negative for adenopathy. Does not bruise/bleed easily.  Psychiatric/Behavioral: Negative for confusion.    Blood pressure 124/72, pulse 80, temperature 99.2 F (37.3 C), temperature source Temporal, resp. rate 16, height 5\' 6"  (1.676 m), weight 162 lb 6.4 oz (73.664 kg), last menstrual period 08/27/2013.  Physical Exam Physical Exam WDWN in NAD HEENT:  EOMI, sclera anicteric Neck:  No masses, no thyromegaly Lungs:  CTA bilaterally; normal respiratory effort CV:  Regular rate and rhythm; no murmurs Abd:  +bowel sounds, soft, moderately tender in RUQ and epigastrium Ext:  Well-perfused; no edema Skin:  Warm, dry; no sign of jaundice  Data Reviewed US Abdomen Complete  10/04/2013   CLINICAL DATA:  Epigastric pain.  EXAM: ULTRASOUND ABDOMEN COMPLETE  COMPARISON:  CT scan of the abdomen dated July 21, 2013.  FINDINGS: Gallbladder:  The gallbladder is adequately distended and contains echogenic mobile shadowing stones. There also adherent intramural shadowing foci which may reflect mural calcifications or calcifications within polyps. There is no gallbladder wall thickening or pericholecystic fluid nor positive sonographic Murphy's sign.  Common bile duct:  Diameter: 2.9 mm  Liver:  No focal lesion identified. Within normal limits in parenchymal echogenicity.  IVC:  No abnormality visualized.  Pancreas:  Visualized portion unremarkable.  Spleen:  Size and appearance within normal limits.  Right Kidney:  Length: 11.5 cm. Echogenicity within normal limits. No mass or hydronephrosis visualized.  Left Kidney:  Length: 11.0 cm. Echogenicity within normal limits. No mass or hydronephrosis visualized.  Abdominal aorta:  No aneurysm visualized.  Other findings:  No ascites is demonstrated.  IMPRESSION: 1. There are small mobile gallstones as well as echogenic nonmobile foci which  may reflect adherent non mobile stones, polyps, or mural calcification. There is no sonographic evidence of acute cholecystitis. 2. Otherwise the examination is within the limits of normal.   Electronically Signed   By: David  [place]   On: 10/04/2013 10:33   US Abdomen Limited  10/16/2013   CLINICAL DATA:  Right upper quadrant pain  EXAM: US ABDOMEN LIMITED - RIGHT UPPER QUADRANT  COMPARISON:  October 04, 2013  FINDINGS: Gallbladder:  A single echogenic focus which moves in shadows is seen in the gallbladder. Note that fewer gallstones are seen at this time compared to recent prior study. There is no gallbladder wall thickening or pericholecystic fluid. Patient is mildly tender over the gallbladder.  Common bile duct:  Diameter: 3 mm. There is no intrahepatic or extrahepatic biliary duct dilatation.  Liver:  No focal lesion identified. Within normal limits in parenchymal echogenicity.  IMPRESSION: Cholelithiasis with fewer gallstones seen on the current study compared to recent prior study. Note that there is no biliary duct dilatation or biliary duct calculus seen, however. Patient mildly tender over the gallbladder. Study otherwise unremarkable.  Electronically Signed   By: Bretta Bang M.D.   On: 10/16/2013 16:56    Lab Results  Component Value Date   WBC 10.8* 10/16/2013   HGB 13.0 10/16/2013   HCT 39.3 10/16/2013   MCV 84.2 10/16/2013   PLT 300 10/16/2013   Lab Results  Component Value Date   CREATININE 0.57 10/16/2013   BUN 8 10/16/2013   NA 139 10/16/2013   K 3.8 10/16/2013   CL 104 10/16/2013   CO2 21 10/16/2013   Lab Results  Component Value Date   ALT 11 10/16/2013   AST 14 10/16/2013   ALKPHOS 79 10/16/2013   BILITOT <0.2* 10/16/2013     Assessment    1.  Chronic calculus cholecystitis 2.  Pregnancy - 8 weeks    Plan    The patient would likely benefit from a laparoscopic cholecystectomy without an intraoperative cholangiogram.  Her surgery should be delayed until she has been thoroughly  evaluated by OB. The second trimester would be the ideal time to perform this laparoscopic surgery. The patient needs to have a thorough discussion of the risks to her baby from the obstetrician because of the requirement for general anesthesia. We have called the [facility] outpatient clinic and they have agreed to move her appointment up a couple of weeks. We will wait to hear back from them before scheduling surgery.The surgical procedure has been discussed with the patient.  Potential risks, benefits, alternative treatments, and expected outcomes have been explained.  All of the patient's questions at this time have been answered.  The likelihood of reaching the patient's treatment goal is good.  The patient understand the proposed surgical procedure and wishes to proceed.         Rhylynn Perdomo K. 10/22/2013, 12:23 PM

## 2013-10-31 ENCOUNTER — Telehealth (INDEPENDENT_AMBULATORY_CARE_PROVIDER_SITE_OTHER): Payer: Self-pay

## 2013-10-31 NOTE — Telephone Encounter (Signed)
Called patient and I told her that she needs to call her OB doctor for the pain med, since she is pregnant

## 2013-10-31 NOTE — Telephone Encounter (Signed)
Pain states her pain is 6-7, she is out of norco asking for refill. Please advise

## 2013-11-03 ENCOUNTER — Encounter (HOSPITAL_COMMUNITY): Payer: Self-pay | Admitting: *Deleted

## 2013-11-03 ENCOUNTER — Inpatient Hospital Stay (HOSPITAL_COMMUNITY): Payer: Medicaid Other

## 2013-11-03 ENCOUNTER — Inpatient Hospital Stay (HOSPITAL_COMMUNITY)
Admission: AD | Admit: 2013-11-03 | Discharge: 2013-11-03 | Disposition: A | Discharge status: Home / Self Care | Payer: Medicaid Other | Source: Ambulatory Visit | Attending: Obstetrics & Gynecology | Admitting: Obstetrics & Gynecology

## 2013-11-03 DIAGNOSIS — K8010 Calculus of gallbladder with chronic cholecystitis without obstruction: Secondary | ICD-10-CM | POA: Insufficient documentation

## 2013-11-03 DIAGNOSIS — R1011 Right upper quadrant pain: Secondary | ICD-10-CM | POA: Insufficient documentation

## 2013-11-03 DIAGNOSIS — O210 Mild hyperemesis gravidarum: Secondary | ICD-10-CM | POA: Insufficient documentation

## 2013-11-03 DIAGNOSIS — O9989 Other specified diseases and conditions complicating pregnancy, childbirth and the puerperium: Secondary | ICD-10-CM | POA: Insufficient documentation

## 2013-11-03 DIAGNOSIS — O99330 Smoking (tobacco) complicating pregnancy, unspecified trimester: Secondary | ICD-10-CM | POA: Insufficient documentation

## 2013-11-03 LAB — URINALYSIS, ROUTINE W REFLEX MICROSCOPIC
BILIRUBIN URINE: NEGATIVE
GLUCOSE, UA: NEGATIVE mg/dL
HGB URINE DIPSTICK: NEGATIVE
Ketones, ur: NEGATIVE mg/dL
Nitrite: NEGATIVE
PH: 6 (ref 5.0–8.0)
Protein, ur: 30 mg/dL — AB
Specific Gravity, Urine: >1.03 — ABNORMAL HIGH (ref 1.005–1.030)
Urobilinogen, UA: 0.2 mg/dL (ref 0.0–1.0)

## 2013-11-03 LAB — CBC
HCT: 38.5 % (ref 36.0–46.0)
HEMOGLOBIN: 13.1 g/dL (ref 12.0–15.0)
MCH: 28.4 pg (ref 26.0–34.0)
MCHC: 34 g/dL (ref 30.0–36.0)
MCV: 83.3 fL (ref 78.0–100.0)
Platelets: 273 10*3/uL (ref 150–400)
RBC: 4.62 MIL/uL (ref 3.87–5.11)
RDW: 14.6 % (ref 11.5–15.5)
WBC: 14.6 10*3/uL — ABNORMAL HIGH (ref 4.0–10.5)

## 2013-11-03 LAB — URINE MICROSCOPIC-ADD ON

## 2013-11-03 LAB — COMPREHENSIVE METABOLIC PANEL
ALK PHOS: 68 U/L (ref 39–117)
ALT: 13 U/L (ref 0–35)
AST: 13 U/L (ref 0–37)
Albumin: 3.6 g/dL (ref 3.5–5.2)
BUN: 9 mg/dL (ref 6–23)
CO2: 22 meq/L (ref 19–32)
Calcium: 9.4 mg/dL (ref 8.4–10.5)
Chloride: 104 mEq/L (ref 96–112)
Creatinine, Ser: 0.51 mg/dL (ref 0.50–1.10)
GFR calc Af Amer: >90 mL/min (ref >90)
GLUCOSE: 92 mg/dL (ref 70–99)
Potassium: 4.3 mEq/L (ref 3.7–5.3)
SODIUM: 139 meq/L (ref 137–147)
Total Bilirubin: <0.2 mg/dL — ABNORMAL LOW (ref 0.3–1.2)
Total Protein: 6.5 g/dL (ref 6.0–8.3)

## 2013-11-03 LAB — LIPASE, BLOOD: Lipase: 38 U/L (ref 11–59)

## 2013-11-03 MED ORDER — PROMETHAZINE HCL 12.5 MG PO TABS: 12.5000 mg | ORAL_TABLET | Freq: Four times a day (QID) | ORAL | Status: DC | PRN

## 2013-11-03 MED ORDER — OXYCODONE-ACETAMINOPHEN 5-325 MG PO TABS: 1.0000 | ORAL_TABLET | Freq: Four times a day (QID) | ORAL | Status: DC | PRN

## 2013-11-03 MED ORDER — GI COCKTAIL ~~LOC~~
30.0000 mL | Freq: Once | ORAL | Status: AC
  Administered 2013-11-03: 30 mL via ORAL
  Filled 2013-11-03: qty 30

## 2013-11-03 MED ORDER — ONDANSETRON 8 MG PO TBDP
8.0000 mg | ORAL_TABLET | Freq: Once | ORAL | Status: AC
  Administered 2013-11-03: 8 mg via ORAL
  Filled 2013-11-03: qty 1

## 2013-11-03 NOTE — MAU Note (Signed)
Pt with known gallstones, having increased pain and vomiting today.  Pt G3 P1 at 9.5wks.

## 2013-11-03 NOTE — MAU Provider Note (Signed)
History     CSN: [id number]  Arrival date and time: 11/03/13 6213   First Provider Initiated Contact with Patient 11/03/13 2017      Chief Complaint  Patient presents with  . Abdominal Pain  . Emesis  . Cholelithiasis   HPI  [name] [name] is a 33 y.o. G3P0021 at [redacted]w[redacted]d who presents today with RUQ pain. She has known gallstones, and has seen a Careers adviser. She saw him on 10/22/13. He plans to remove her gallbladder when she is in the second trimester. She has an appointment at the clinic on Wednesday. However today the pain is increased, and she has used all the medication that the surgeon gave her for the pain. She last ate mashed potatoes at 1600 today. She is also having nausea and vomiting. She denies any fever.   Past Medical History  Diagnosis Date  . Narcotic abuse   . Anxiety   . Depression   . Chronic headaches   . H. pylori infection     Past Surgical History  Procedure Laterality Date  . Abortions    . Wisdom tooth extraction      Family History  Problem Relation Age of Onset  . Diabetes Mother   . Cancer Maternal Aunt   . Cancer Maternal Uncle   . Cancer Paternal Aunt   . Cancer Paternal Uncle     History  Substance Use Topics  . Smoking status: Current Every Day Smoker -- 2.00 packs/day for 15 years    Types: Cigarettes  . Smokeless tobacco: Never Used  . Alcohol Use: No     Comment: recovering addict    Allergies: No Known Allergies  Prescriptions prior to admission  Medication Sig Dispense Refill  . butalbital-acetaminophen-caffeine (FIORICET) 50-325-40 MG per tablet Take 1-2 tablets by mouth 3 (three) times daily.  20 tablet  0  . calcium carbonate (TUMS - DOSED IN MG ELEMENTAL CALCIUM) 500 MG chewable tablet Chew 6-8 tablets by mouth as needed for indigestion or heartburn.       . dicyclomine (BENTYL) 20 MG tablet Take 1 tablet (20 mg total) by mouth 4 (four) times daily -  before meals and at bedtime.  120 tablet  0  . HYDROcodone-acetaminophen  (NORCO/VICODIN) 5-325 MG per tablet Take 1 tablet by mouth every 6 (six) hours as needed for moderate pain.  40 tablet  0  . omeprazole (PRILOSEC) 40 MG capsule Take 40 mg by mouth daily.      . ondansetron (ZOFRAN ODT) 8 MG disintegrating tablet Take 1 tablet (8 mg total) by mouth every 8 (eight) hours as needed for nausea or vomiting.  20 tablet  0  . Prenatal Vit-Fe Fumarate-FA (PRENATAL MULTIVITAMIN) TABS tablet Take 1 tablet by mouth daily at 12 noon.      . promethazine (PHENERGAN) 12.5 MG tablet Take 1 tablet (12.5 mg total) by mouth every 6 (six) hours as needed for nausea or vomiting.  30 tablet  0  . sucralfate (CARAFATE) 1 G tablet Take 1 g by mouth 4 (four) times daily -  with meals and at bedtime.        ROS Physical Exam   Blood pressure 116/70, pulse 111, resp. rate 18, height 5\' 6"  (1.676 m), weight 72.394 kg (159 lb 9.6 oz), last menstrual period 08/27/2013.  Physical Exam  Nursing note and vitals reviewed. Constitutional: She is oriented to person, place, and time. She appears well-developed and well-nourished. No distress.  Cardiovascular: Normal rate.   Respiratory:  Effort normal.  GI: Soft. There is tenderness (mildly tender in RUQ and epigastric area). There is no rebound and no guarding.  Neurological: She is alert and oriented to person, place, and time.  Skin: Skin is warm and dry.  Psychiatric: She has a normal mood and affect.    MAU Course  Procedures  Results for orders placed during the hospital encounter of 11/03/13 (from the past 24 hour(s))  URINALYSIS, ROUTINE W REFLEX MICROSCOPIC     Status: Abnormal   Collection Time    11/03/13  7:45 PM      Result Value Ref Range   Color, Urine YELLOW  YELLOW   APPearance CLEAR  CLEAR   Specific Gravity, Urine >1.030 (*) 1.005 - 1.030   pH 6.0  5.0 - 8.0   Glucose, UA NEGATIVE  NEGATIVE mg/dL   Hgb urine dipstick NEGATIVE  NEGATIVE   Bilirubin Urine NEGATIVE  NEGATIVE   Ketones, ur NEGATIVE  NEGATIVE mg/dL    Protein, ur 30 (*) NEGATIVE mg/dL   Urobilinogen, UA 0.2  0.0 - 1.0 mg/dL   Nitrite NEGATIVE  NEGATIVE   Leukocytes, UA SMALL (*) NEGATIVE  URINE MICROSCOPIC-ADD ON     Status: Abnormal   Collection Time    11/03/13  7:45 PM      Result Value Ref Range   Squamous Epithelial / LPF FEW (*) RARE   WBC, UA 3-6  <3 WBC/hpf   RBC / HPF 3-6  <3 RBC/hpf   Bacteria, UA FEW (*) RARE  CBC     Status: Abnormal   Collection Time    11/03/13  8:15 PM      Result Value Ref Range   WBC 14.6 (*) 4.0 - 10.5 K/uL   RBC 4.62  3.87 - 5.11 MIL/uL   Hemoglobin 13.1  12.0 - 15.0 g/dL   HCT 16.1  09.6 - 04.5 %   MCV 83.3  78.0 - 100.0 fL   MCH 28.4  26.0 - 34.0 pg   MCHC 34.0  30.0 - 36.0 g/dL   RDW 40.9  81.1 - 91.4 %   Platelets 273  150 - 400 K/uL  COMPREHENSIVE METABOLIC PANEL     Status: Abnormal   Collection Time    11/03/13  8:15 PM      Result Value Ref Range   Sodium 139  137 - 147 mEq/L   Potassium 4.3  3.7 - 5.3 mEq/L   Chloride 104  96 - 112 mEq/L   CO2 22  19 - 32 mEq/L   Glucose, Bld 92  70 - 99 mg/dL   BUN 9  6 - 23 mg/dL   Creatinine, Ser 7.82  0.50 - 1.10 mg/dL   Calcium 9.4  8.4 - 95.6 mg/dL   Total Protein 6.5  6.0 - 8.3 g/dL   Albumin 3.6  3.5 - 5.2 g/dL   AST 13  0 - 37 U/L   ALT 13  0 - 35 U/L   Alkaline Phosphatase 68  39 - 117 U/L   Total Bilirubin <0.2 (*) 0.3 - 1.2 mg/dL   GFR calc non Af Amer >90  >90 mL/min   GFR calc Af Amer >90  >90 mL/min  LIPASE, BLOOD     Status: None   Collection Time    11/03/13  8:15 PM      Result Value Ref Range   Lipase 38  11 - 59 U/L   US Ob Comp Less 14 Wks  11/03/2013   CLINICAL DATA:  33 year old pregnant female with pelvic pain. Estimated gestational age of [redacted] weeks 5 days by LMP.  EXAM: TRANSVAGINAL OB ULTRASOUND; OBSTETRIC <14 WK ULTRASOUND  TECHNIQUE: Transvaginal ultrasound was performed for complete evaluation of the gestation as well as the maternal uterus, adnexal regions, and pelvic cul-de-sac.  COMPARISON:  None.   FINDINGS: Intrauterine gestational sac: Visualized/normal in shape.  Yolk sac:  Visualized  Embryo:  Visualized  Cardiac Activity: Visualized  Heart Rate: 178 bpm  CRL:   27.3  mm   9 w 4 d                  US EDC: 06/04/2014  Maternal uterus/adnexae: There is no evidence of subchorionic hemorrhage.  The left ovary is unremarkable.  The right ovary is not visualized.  There is no evidence of free fluid or adnexal.  IMPRESSION: Single living intrauterine gestation with estimated gestational age of [redacted] weeks 4 days by this ultrasound. No evidence of subchorionic hemorrhage.  Right ovary not visualized.   Electronically Signed   By: Laveda AbbeJeff  Hu M.D.   On: 11/03/2013 22:37   [place] Ob Transvaginal  11/03/2013   CLINICAL DATA:  33 year old pregnant female with pelvic pain. Estimated gestational age of [redacted] weeks 5 days by LMP.  EXAM: TRANSVAGINAL OB ULTRASOUND; OBSTETRIC <14 WK ULTRASOUND  TECHNIQUE: Transvaginal ultrasound was performed for complete evaluation of the gestation as well as the maternal uterus, adnexal regions, and pelvic cul-de-sac.  COMPARISON:  None.  FINDINGS: Intrauterine gestational sac: Visualized/normal in shape.  Yolk sac:  Visualized  Embryo:  Visualized  Cardiac Activity: Visualized  Heart Rate: 178 bpm  CRL:   27.3  mm   9 w 4 d                  US EDC: 06/04/2014  Maternal uterus/adnexae: There is no evidence of subchorionic hemorrhage.  The left ovary is unremarkable.  The right ovary is not visualized.  There is no evidence of free fluid or adnexal.  IMPRESSION: Single living intrauterine gestation with estimated gestational age of [redacted] weeks 4 days by this ultrasound. No evidence of subchorionic hemorrhage.  Right ovary not visualized.   Electronically Signed   By: Laveda AbbeJeff  Hu M.D.   On: 11/03/2013 22:37   D/W Dr. Penne LashLeggett. Ok for [employer] home with pain management if liver enzymes are normal. Will FU with clinic on Wednesday.   Assessment and Plan   1. Chronic cholecystitis with calculus       Medication List         butalbital-acetaminophen-caffeine 50-325-40 MG per tablet  Commonly known as:  FIORICET  Take 1-2 tablets by mouth 3 (three) times daily.     calcium carbonate 500 MG chewable tablet  Commonly known as:  TUMS - dosed in mg elemental calcium  Chew 6-8 tablets by mouth as needed for indigestion or heartburn.     dicyclomine 20 MG tablet  Commonly known as:  BENTYL  Take 1 tablet (20 mg total) by mouth 4 (four) times daily -  before meals and at bedtime.     HYDROcodone-acetaminophen 5-325 MG per tablet  Commonly known as:  NORCO/VICODIN  Take 1 tablet by mouth every 6 (six) hours as needed for moderate pain.     omeprazole 40 MG capsule  Commonly known as:  PRILOSEC  Take 40 mg by mouth daily.     ondansetron 8 MG disintegrating tablet  Commonly known as:  ZOFRAN ODT  Take 1 tablet (8 mg total) by mouth every 8 (eight) hours as needed for nausea or vomiting.     oxyCODONE-acetaminophen 5-325 MG per tablet  Commonly known as:  ROXICET  Take 1-2 tablets by mouth every 6 (six) hours as needed for severe pain.     prenatal multivitamin Tabs tablet  Take 1 tablet by mouth daily at 12 noon.     promethazine 12.5 MG tablet  Commonly known as:  PHENERGAN  Take 1 tablet (12.5 mg total) by mouth every 6 (six) hours as needed for nausea or vomiting.     promethazine 12.5 MG tablet  Commonly known as:  PHENERGAN  Take 1 tablet (12.5 mg total) by mouth every 6 (six) hours as needed for nausea or vomiting.     sucralfate 1 G tablet  Commonly known as:  CARAFATE  Take 1 g by mouth 4 (four) times daily -  with meals and at bedtime.       Follow-up Information   Follow up with [facility]. (As scheduled)    Specialty:  Obstetrics and Gynecology   Contact information:   [address] [place] [address] [phone]       Tawnya Crook 11/03/2013, 8:18 PM

## 2013-11-03 NOTE — Discharge Instructions (Signed)
Cholelithiasis °Cholelithiasis (also called gallstones) is a form of gallbladder disease in which gallstones form in your gallbladder. The gallbladder is an organ that stores bile made in the liver, which helps digest fats. Gallstones begin as small crystals and slowly grow into stones. Gallstone pain occurs when the gallbladder spasms and a gallstone is blocking the duct. Pain can also occur when a stone passes out of the duct.  °RISK FACTORS °· Being female.   °· Having multiple pregnancies. Health care providers sometimes advise removing diseased gallbladders before future pregnancies.   °· Being obese. °· Eating a diet heavy in fried foods and fat.   °· Being older than 60 years and increasing age.   °· Prolonged use of medicines containing female hormones.   °· Having diabetes mellitus.   °· Rapidly losing weight.   °· Having a family history of gallstones (heredity).   °SYMPTOMS °· Nausea.   °· Vomiting. °· Abdominal pain.   °· Yellowing of the skin (jaundice).   °· Sudden pain. It may persist from several minutes to several hours. °· Fever.   °· Tenderness to the touch.  °In some cases, when gallstones do not move into the bile duct, people have no pain or symptoms. These are called "silent" gallstones.  °TREATMENT °Silent gallstones do not need treatment. In severe cases, emergency surgery may be required. Options for treatment include: °· Surgery to remove the gallbladder. This is the most common treatment. °· Medicines. These do not always work and may take 6 12 months or more to work. °· Shock wave treatment (extracorporeal biliary lithotripsy). In this treatment an ultrasound machine sends shock waves to the gallbladder to break gallstones into smaller pieces that can pass into the intestines or be dissolved by medicine. °HOME CARE INSTRUCTIONS  °· Only take over-the-counter or prescription medicines for pain, discomfort, or fever as directed by your health care provider.   °· Follow a low-fat diet until  seen again by your health care provider. Fat causes the gallbladder to contract, which can result in pain.   °· Follow up with your health care provider as directed. Attacks are almost always recurrent and surgery is usually required for permanent treatment.   °SEEK IMMEDIATE MEDICAL CARE IF:  °· Your pain increases and is not controlled by medicines.   °· You have a fever or persistent symptoms for more than 2 3 days.   °· You have a fever and your symptoms suddenly get worse.   °· You have persistent nausea and vomiting.   °MAKE SURE YOU:  °· Understand these instructions. °· Will watch your condition. °· Will get help right away if you are not doing well or get worse. °Document Released: 08/25/2005 Document Revised: 05/01/2013 Document Reviewed: 02/20/2013 °ExitCare® Patient Information ©2014 Exit[employer], LLC. ° °

## 2013-11-06 ENCOUNTER — Encounter: Payer: Self-pay | Admitting: Advanced Practice Midwife

## 2013-11-06 ENCOUNTER — Ambulatory Visit (INDEPENDENT_AMBULATORY_CARE_PROVIDER_SITE_OTHER): Payer: Medicaid Other | Admitting: Advanced Practice Midwife

## 2013-11-06 ENCOUNTER — Other Ambulatory Visit (INDEPENDENT_AMBULATORY_CARE_PROVIDER_SITE_OTHER): Payer: Self-pay | Admitting: Surgery

## 2013-11-06 VITALS — BP 103/72 | Temp 97.6°F | Wt 160.5 lb

## 2013-11-06 DIAGNOSIS — O99611 Diseases of the digestive system complicating pregnancy, first trimester: Secondary | ICD-10-CM

## 2013-11-06 DIAGNOSIS — K8020 Calculus of gallbladder without cholecystitis without obstruction: Secondary | ICD-10-CM

## 2013-11-06 DIAGNOSIS — O0990 Supervision of high risk pregnancy, unspecified, unspecified trimester: Secondary | ICD-10-CM

## 2013-11-06 DIAGNOSIS — O26619 Liver and biliary tract disorders in pregnancy, unspecified trimester: Secondary | ICD-10-CM

## 2013-11-06 LAB — OB RESULTS CONSOLE GC/CHLAMYDIA
Chlamydia: NEGATIVE
GC PROBE AMP, GENITAL: NEGATIVE

## 2013-11-06 LAB — POCT URINALYSIS DIP (DEVICE)
BILIRUBIN URINE: NEGATIVE
Glucose, UA: NEGATIVE mg/dL
HGB URINE DIPSTICK: NEGATIVE
KETONES UR: NEGATIVE mg/dL
Leukocytes, UA: NEGATIVE
Nitrite: NEGATIVE
PH: 6.5 (ref 5.0–8.0)
Protein, ur: NEGATIVE mg/dL
SPECIFIC GRAVITY, URINE: 1.025 (ref 1.005–1.030)
Urobilinogen, UA: 0.2 mg/dL (ref 0.0–1.0)

## 2013-11-06 MED ORDER — PROMETHAZINE HCL 12.5 MG PO TABS: 12.5000 mg | ORAL_TABLET | Freq: Four times a day (QID) | ORAL | Status: DC | PRN

## 2013-11-06 MED ORDER — OXYCODONE-ACETAMINOPHEN 5-325 MG PO TABS: 1.0000 | ORAL_TABLET | Freq: Four times a day (QID) | ORAL | Status: DC | PRN

## 2013-11-06 NOTE — Progress Notes (Signed)
   Subjective:    [name] [name] is a G3P0020 at 8813w1d being seen today for her first obstetrical visit.  Her obstetrical history is significant for TAB x2. Patient does intend to breast feed. Pregnancy history fully reviewed.   Patient reports vomiting and cholecystitis with plan for cholecystectomy in second trimester.  Pt has significant pain daily and nausea with vomiting 2-4x per day, especially when the pain is severe.  She is currently on low-fat diet with plan for surgery with Dr [name] [name] in second trimester.  She is taking Percocet for pain and is concerned about its safety in pregnancy.    Filed Vitals:   11/06/13 1051  BP: 103/72  Temp: 97.6 F (36.4 C)  Weight: 160 lb 8 oz (72.802 kg)    HISTORY: OB History  Gravida Para Term Preterm AB SAB TAB Ectopic Multiple Living  3 0   2  2   1     # Outcome Date GA Lbr Len/2nd Weight Sex Delivery Anes PTL Lv  3 CUR           2 TAB           1 TAB              Past Medical History  Diagnosis Date  . Narcotic abuse   . Anxiety   . Depression   . Chronic headaches   . H. pylori infection    Past Surgical History  Procedure Laterality Date  . Abortions    . Wisdom tooth extraction     Family History  Problem Relation Age of Onset  . Diabetes Mother   . Cancer Maternal Aunt   . Cancer Maternal Uncle   . Cancer Paternal Aunt   . Cancer Paternal Uncle      Exam    Uterus:     Pelvic Exam: deferred  System: Breast:  normal appearance, no masses or tenderness   Skin: normal coloration and turgor, no rashes    Neurologic: oriented, normal, normal mood   Extremities: ROM of all joints is normal   HEENT neck supple with midline trachea and thyroid without masses   Mouth/Teeth mucous membranes moist, pharynx normal without lesions   Neck supple and no masses   Cardiovascular: regular rate and rhythm   Respiratory:  appears well, vitals normal, no respiratory distress, acyanotic, normal RR, ear and throat exam is  normal, neck free of mass or lymphadenopathy, chest clear, no wheezing, crepitations, rhonchi, normal symmetric air entry   Abdomen: soft, non-tender; bowel sounds normal; no masses,  no organomegaly   Urinary: not evaluated      Assessment:    Pregnancy: [id number] Patient Active Problem List   Diagnosis Date Noted  . High-risk pregnancy 11/06/2013  . Chronic cholecystitis with calculus 10/22/2013        Plan:     Initial labs drawn. Prenatal vitamins. Percocet 5/325, 1-2 tabs Q 6 hours PRN x30 tabs.  Reviewed short-term safety of this medication in pregnancy with pt.  Pt has plan for surgery to eliminate her pain so will not need medication long-term.  Phenergan 12.5-25 mg PO Q 6 hours PRN Problem list reviewed and updated. Genetic Screening discussed First Screen: ordered.  Ultrasound discussed; fetal survey: requested.  Follow up in 2 weeks. 50% of 30 min visit spent on counseling and coordination of care.     [name], [name] Ruhe 11/06/2013

## 2013-11-06 NOTE — Progress Notes (Signed)
P=105,   Here for initial OB visit.  Given new patient information. Discussed bmi/ appropriate weight gain. C/o epigastric pain- states has gall stones. C/o cramping everyday .

## 2013-11-07 ENCOUNTER — Encounter: Payer: Self-pay | Admitting: Advanced Practice Midwife

## 2013-11-07 LAB — OBSTETRIC PANEL
ANTIBODY SCREEN: NEGATIVE
Basophils Absolute: 0 10*3/uL (ref 0.0–0.1)
Basophils Relative: 0 % (ref 0–1)
EOS ABS: 0 10*3/uL (ref 0.0–0.7)
Eosinophils Relative: 0 % (ref 0–5)
HCT: 40.3 % (ref 36.0–46.0)
HEMOGLOBIN: 13.5 g/dL (ref 12.0–15.0)
Hepatitis B Surface Ag: NEGATIVE
LYMPHS ABS: 4.6 10*3/uL — AB (ref 0.7–4.0)
Lymphocytes Relative: 36 % (ref 12–46)
MCH: 28 pg (ref 26.0–34.0)
MCHC: 33.5 g/dL (ref 30.0–36.0)
MCV: 83.4 fL (ref 78.0–100.0)
MONO ABS: 0.9 10*3/uL (ref 0.1–1.0)
MONOS PCT: 7 % (ref 3–12)
Neutro Abs: 7.4 10*3/uL (ref 1.7–7.7)
Neutrophils Relative %: 57 % (ref 43–77)
Platelets: 309 10*3/uL (ref 150–400)
RBC: 4.83 MIL/uL (ref 3.87–5.11)
RDW: 15 % (ref 11.5–15.5)
RH TYPE: POSITIVE
RUBELLA: 0.85 {index} (ref <0.90)
WBC: 12.9 10*3/uL — AB (ref 4.0–10.5)

## 2013-11-07 LAB — HIV ANTIBODY (ROUTINE TESTING W REFLEX): HIV: NONREACTIVE

## 2013-11-08 LAB — GC/CHLAMYDIA PROBE AMP
CT Probe RNA: NEGATIVE
GC Probe RNA: NEGATIVE

## 2013-11-09 LAB — CULTURE, OB URINE: Colony Count: 5000

## 2013-11-11 ENCOUNTER — Other Ambulatory Visit: Payer: Self-pay | Admitting: Obstetrics & Gynecology

## 2013-11-11 ENCOUNTER — Telehealth: Payer: Self-pay

## 2013-11-11 DIAGNOSIS — Z3682 Encounter for antenatal screening for nuchal translucency: Secondary | ICD-10-CM

## 2013-11-11 NOTE — Telephone Encounter (Signed)
Message copied by Louanna RawAMPBELL, Darianne Muralles M on Mon Nov 11, 2013  9:30 AM ------      Message from: LEFTWICH-KIRBY, LISA A      Created: Thu Nov 07, 2013 10:36 PM      Regarding: Pt needs MFM appt for NT and First screen      Contact: [phone]       I saw this pt for initial OB in clinic this week and I ordered an MFM consult for NT and First screen. She is currently 10 weeks so needs this in next couple of weeks.  I don't see it scheduled for her and I can't remember if I sent her to someone for check out or if I let her leave the office without scheduling.  Please make an appointment for her with MFM and call the pt to let her know. Thanks so much. ------

## 2013-11-11 NOTE — Telephone Encounter (Signed)
MFM first trimester screen scheduled for 0900 11/26/13. Called pt. And informed her of time, date and location of appointment. Pt. Verbalized understanding and had no questions or concerns.

## 2013-11-12 NOTE — MAU Provider Note (Signed)
Attestation of Attending Supervision of Advanced Practitioner (CNM/NP): Evaluation and management procedures were performed by the Advanced Practitioner under my supervision and collaboration. I have reviewed the Advanced Practitioner's note and chart, and I agree with the management and plan.  Vernesha Talbot H. 10:43 PM

## 2013-11-15 ENCOUNTER — Telehealth: Payer: Self-pay | Admitting: *Deleted

## 2013-11-15 NOTE — Telephone Encounter (Addendum)
Pt left message on nurse voice mail requesting refill of pain medication.  3/9  1550  Called and spoke with pt. She stated that she is requesting refill of Percocet. I asked if she has taken all of the #30 tabs which were given to her on 2/25 and she said yes. I also asked if she has taken all of the #40 tabs of hydrocodone which were given to her on 2/10 by Dr. Corliss Skainssuei and she said yes. I then stated that she will need to discuss her request with the doctor at her next clinic visit on 3/12.  Pt voiced understanding.

## 2013-11-19 ENCOUNTER — Encounter: Payer: Medicaid Other | Admitting: Advanced Practice Midwife

## 2013-11-20 ENCOUNTER — Inpatient Hospital Stay (HOSPITAL_COMMUNITY)
Admission: EM | Admit: 2013-11-20 | Discharge: 2013-11-22 | DRG: 781 | Disposition: A | Discharge status: Home / Self Care | Payer: Medicaid Other | Attending: Surgery | Admitting: Surgery

## 2013-11-20 ENCOUNTER — Encounter (HOSPITAL_COMMUNITY): Payer: Self-pay | Admitting: Emergency Medicine

## 2013-11-20 ENCOUNTER — Emergency Department (HOSPITAL_COMMUNITY): Payer: Medicaid Other

## 2013-11-20 DIAGNOSIS — Z3490 Encounter for supervision of normal pregnancy, unspecified, unspecified trimester: Secondary | ICD-10-CM

## 2013-11-20 DIAGNOSIS — O9989 Other specified diseases and conditions complicating pregnancy, childbirth and the puerperium: Principal | ICD-10-CM | POA: Diagnosis present

## 2013-11-20 DIAGNOSIS — K8020 Calculus of gallbladder without cholecystitis without obstruction: Secondary | ICD-10-CM

## 2013-11-20 DIAGNOSIS — F3289 Other specified depressive episodes: Secondary | ICD-10-CM | POA: Diagnosis present

## 2013-11-20 DIAGNOSIS — F411 Generalized anxiety disorder: Secondary | ICD-10-CM | POA: Diagnosis present

## 2013-11-20 DIAGNOSIS — F329 Major depressive disorder, single episode, unspecified: Secondary | ICD-10-CM | POA: Diagnosis present

## 2013-11-20 DIAGNOSIS — K8012 Calculus of gallbladder with acute and chronic cholecystitis without obstruction: Secondary | ICD-10-CM | POA: Diagnosis present

## 2013-11-20 DIAGNOSIS — K819 Cholecystitis, unspecified: Secondary | ICD-10-CM

## 2013-11-20 DIAGNOSIS — Z331 Pregnant state, incidental: Secondary | ICD-10-CM

## 2013-11-20 DIAGNOSIS — O99611 Diseases of the digestive system complicating pregnancy, first trimester: Secondary | ICD-10-CM

## 2013-11-20 DIAGNOSIS — K8010 Calculus of gallbladder with chronic cholecystitis without obstruction: Secondary | ICD-10-CM

## 2013-11-20 DIAGNOSIS — O99340 Other mental disorders complicating pregnancy, unspecified trimester: Secondary | ICD-10-CM

## 2013-11-20 DIAGNOSIS — N390 Urinary tract infection, site not specified: Secondary | ICD-10-CM

## 2013-11-20 DIAGNOSIS — K8000 Calculus of gallbladder with acute cholecystitis without obstruction: Secondary | ICD-10-CM | POA: Diagnosis present

## 2013-11-20 DIAGNOSIS — R1011 Right upper quadrant pain: Secondary | ICD-10-CM

## 2013-11-20 DIAGNOSIS — K219 Gastro-esophageal reflux disease without esophagitis: Secondary | ICD-10-CM | POA: Diagnosis present

## 2013-11-20 DIAGNOSIS — F17200 Nicotine dependence, unspecified, uncomplicated: Secondary | ICD-10-CM | POA: Diagnosis present

## 2013-11-20 DIAGNOSIS — R51 Headache: Secondary | ICD-10-CM | POA: Diagnosis present

## 2013-11-20 LAB — COMPREHENSIVE METABOLIC PANEL
ALK PHOS: 64 U/L (ref 39–117)
ALT: 11 U/L (ref 0–35)
AST: 12 U/L (ref 0–37)
Albumin: 4 g/dL (ref 3.5–5.2)
BUN: 7 mg/dL (ref 6–23)
CHLORIDE: 101 meq/L (ref 96–112)
CO2: 21 mEq/L (ref 19–32)
Calcium: 9.7 mg/dL (ref 8.4–10.5)
Creatinine, Ser: 0.49 mg/dL — ABNORMAL LOW (ref 0.50–1.10)
GFR calc Af Amer: >90 mL/min (ref >90)
Glucose, Bld: 85 mg/dL (ref 70–99)
POTASSIUM: 4.3 meq/L (ref 3.7–5.3)
Sodium: 138 mEq/L (ref 137–147)
Total Protein: 7.3 g/dL (ref 6.0–8.3)

## 2013-11-20 LAB — CBC WITH DIFFERENTIAL/PLATELET
Basophils Absolute: 0 10*3/uL (ref 0.0–0.1)
Basophils Relative: 0 % (ref 0–1)
Eosinophils Absolute: 0.1 10*3/uL (ref 0.0–0.7)
Eosinophils Relative: 1 % (ref 0–5)
HEMATOCRIT: 38.4 % (ref 36.0–46.0)
HEMOGLOBIN: 13.1 g/dL (ref 12.0–15.0)
Lymphocytes Relative: 31 % (ref 12–46)
Lymphs Abs: 3.9 10*3/uL (ref 0.7–4.0)
MCH: 28.6 pg (ref 26.0–34.0)
MCHC: 34.1 g/dL (ref 30.0–36.0)
MCV: 83.8 fL (ref 78.0–100.0)
MONOS PCT: 6 % (ref 3–12)
Monocytes Absolute: 0.8 10*3/uL (ref 0.1–1.0)
NEUTROS PCT: 62 % (ref 43–77)
Neutro Abs: 7.9 10*3/uL — ABNORMAL HIGH (ref 1.7–7.7)
Platelets: 307 10*3/uL (ref 150–400)
RBC: 4.58 MIL/uL (ref 3.87–5.11)
RDW: 14.6 % (ref 11.5–15.5)
WBC: 12.7 10*3/uL — ABNORMAL HIGH (ref 4.0–10.5)

## 2013-11-20 LAB — URINALYSIS, ROUTINE W REFLEX MICROSCOPIC
BILIRUBIN URINE: NEGATIVE
Glucose, UA: NEGATIVE mg/dL
KETONES UR: NEGATIVE mg/dL
NITRITE: NEGATIVE
PROTEIN: NEGATIVE mg/dL
Specific Gravity, Urine: 1.012 (ref 1.005–1.030)
Urobilinogen, UA: 0.2 mg/dL (ref 0.0–1.0)
pH: 6 (ref 5.0–8.0)

## 2013-11-20 LAB — URINE MICROSCOPIC-ADD ON

## 2013-11-20 LAB — LIPASE, BLOOD: LIPASE: 45 U/L (ref 11–59)

## 2013-11-20 MED ORDER — HYDROMORPHONE HCL PF 1 MG/ML IJ SOLN
1.0000 mg | INTRAMUSCULAR | Status: DC | PRN
  Administered 2013-11-20 – 2013-11-22 (×9): 1 mg via INTRAVENOUS
  Filled 2013-11-20 (×10): qty 1

## 2013-11-20 MED ORDER — KCL IN DEXTROSE-NACL 20-5-0.9 MEQ/L-%-% IV SOLN
INTRAVENOUS | Status: DC
  Administered 2013-11-20: via INTRAVENOUS
  Filled 2013-11-20 (×3): qty 1000

## 2013-11-20 MED ORDER — HYDROMORPHONE HCL PF 1 MG/ML IJ SOLN
1.0000 mg | Freq: Once | INTRAMUSCULAR | Status: AC
  Administered 2013-11-20: 1 mg via INTRAMUSCULAR
  Filled 2013-11-20: qty 1

## 2013-11-20 MED ORDER — SODIUM CHLORIDE 0.9 % IV BOLUS (SEPSIS)
1000.0000 mL | Freq: Once | INTRAVENOUS | Status: AC
  Administered 2013-11-20: 1000 mL via INTRAVENOUS

## 2013-11-20 MED ORDER — SODIUM CHLORIDE 0.9 % IV SOLN
Freq: Once | INTRAVENOUS | Status: AC
  Administered 2013-11-20: 22:00:00 via INTRAVENOUS

## 2013-11-20 MED ORDER — OXYCODONE-ACETAMINOPHEN 5-325 MG PO TABS
1.0000 | ORAL_TABLET | Freq: Once | ORAL | Status: AC
  Administered 2013-11-20: 1 via ORAL
  Filled 2013-11-20: qty 1

## 2013-11-20 MED ORDER — PROMETHAZINE HCL 25 MG/ML IJ SOLN
12.5000 mg | Freq: Four times a day (QID) | INTRAMUSCULAR | Status: DC | PRN
  Administered 2013-11-20 – 2013-11-21 (×3): 12.5 mg via INTRAVENOUS
  Filled 2013-11-20 (×3): qty 1

## 2013-11-20 MED ORDER — HYDROMORPHONE HCL PF 1 MG/ML IJ SOLN
1.0000 mg | Freq: Once | INTRAMUSCULAR | Status: AC
  Administered 2013-11-20: 1 mg via INTRAVENOUS
  Filled 2013-11-20: qty 1

## 2013-11-20 NOTE — H&P (Signed)
[name] [name] is an 33 y.o. female.   Chief Complaint: abdominal pain, gallstones  HPI: patient is a 33 year old female who has been having worsening upper, pain since late last year. She initially was found to be H. Pylori positive and was treated for this. However she developed worsening postprandial epigastric pain and in January was found to have gallstones. At about this time she was also found to be pregnant. She was seen in our office at the end of January with ongoing episodic postprandial epigastric pain. She was felt to have cholelithiasis and cholecystitis. She had not yet had a prostatic evaluation. Since then she has had her initial obstetrical evaluation with no other complicating factors found that her pregnancy. She is now approximately [redacted] weeks gestation.  She has had steadily worsening pain over the last several weeks. She has been using Percocet on a daily basis but came to the emergency room today as this is no longer controlling the pain. She's having episodic vomiting. Some diarrhea. Denies fever chills or jaundice. She was treated with pain medication in the emergency department today but feels this just took the edge off and she has having continued pain. She describes sharp and pressure-like pain in her epigastrium and right upper quadrant.  Past Medical History  Diagnosis Date  . Narcotic abuse   . Anxiety   . Depression   . Chronic headaches   . H. pylori infection     Past Surgical History  Procedure Laterality Date  . Abortions    . Wisdom tooth extraction      Family History  Problem Relation Age of Onset  . Diabetes Mother   . Cancer Maternal Aunt   . Cancer Maternal Uncle   . Cancer Paternal Aunt   . Cancer Paternal Uncle    Social History:  reports that she has been smoking Cigarettes.  She has a 15 pack-year smoking history. She has never used smokeless tobacco. She reports that she does not drink alcohol or use illicit drugs.  Allergies: No Known  Allergies   (Not in a hospital admission)  Results for orders placed during the hospital encounter of 11/20/13 (from the past 48 hour(s))  CBC WITH DIFFERENTIAL     Status: Abnormal   Collection Time    11/20/13  4:23 PM      Result Value Ref Range   WBC 12.7 (*) 4.0 - 10.5 K/uL   RBC 4.58  3.87 - 5.11 MIL/uL   Hemoglobin 13.1  12.0 - 15.0 g/dL   HCT 38.4  36.0 - 46.0 %   MCV 83.8  78.0 - 100.0 fL   MCH 28.6  26.0 - 34.0 pg   MCHC 34.1  30.0 - 36.0 g/dL   RDW 14.6  11.5 - 15.5 %   Platelets 307  150 - 400 K/uL   Neutrophils Relative % 62  43 - 77 %   Neutro Abs 7.9 (*) 1.7 - 7.7 K/uL   Lymphocytes Relative 31  12 - 46 %   Lymphs Abs 3.9  0.7 - 4.0 K/uL   Monocytes Relative 6  3 - 12 %   Monocytes Absolute 0.8  0.1 - 1.0 K/uL   Eosinophils Relative 1  0 - 5 %   Eosinophils Absolute 0.1  0.0 - 0.7 K/uL   Basophils Relative 0  0 - 1 %   Basophils Absolute 0.0  0.0 - 0.1 K/uL  COMPREHENSIVE METABOLIC PANEL     Status: Abnormal   Collection  Time    11/20/13  4:23 PM      Result Value Ref Range   Sodium 138  137 - 147 mEq/L   Potassium 4.3  3.7 - 5.3 mEq/L   Chloride 101  96 - 112 mEq/L   CO2 21  19 - 32 mEq/L   Glucose, Bld 85  70 - 99 mg/dL   BUN 7  6 - 23 mg/dL   Creatinine, Ser 0.49 (*) 0.50 - 1.10 mg/dL   Calcium 9.7  8.4 - 10.5 mg/dL   Total Protein 7.3  6.0 - 8.3 g/dL   Albumin 4.0  3.5 - 5.2 g/dL   AST 12  0 - 37 U/L   ALT 11  0 - 35 U/L   Alkaline Phosphatase 64  39 - 117 U/L   Total Bilirubin <0.2 (*) 0.3 - 1.2 mg/dL   GFR calc non Af Amer >90  >90 mL/min   GFR calc Af Amer >90  >90 mL/min   Comment: (NOTE)     The eGFR has been calculated using the CKD EPI equation.     This calculation has not been validated in all clinical situations.     eGFR's persistently <90 mL/min signify possible Chronic Kidney     Disease.  LIPASE, BLOOD     Status: None   Collection Time    11/20/13  4:23 PM      Result Value Ref Range   Lipase 45  11 - 59 U/L  URINALYSIS,  ROUTINE W REFLEX MICROSCOPIC     Status: Abnormal   Collection Time    11/20/13  5:30 PM      Result Value Ref Range   Color, Urine YELLOW  YELLOW   APPearance CLOUDY (*) CLEAR   Specific Gravity, Urine 1.012  1.005 - 1.030   pH 6.0  5.0 - 8.0   Glucose, UA NEGATIVE  NEGATIVE mg/dL   Hgb urine dipstick TRACE (*) NEGATIVE   Bilirubin Urine NEGATIVE  NEGATIVE   Ketones, ur NEGATIVE  NEGATIVE mg/dL   Protein, ur NEGATIVE  NEGATIVE mg/dL   Urobilinogen, UA 0.2  0.0 - 1.0 mg/dL   Nitrite NEGATIVE  NEGATIVE   Leukocytes, UA LARGE (*) NEGATIVE  URINE MICROSCOPIC-ADD ON     Status: Abnormal   Collection Time    11/20/13  5:30 PM      Result Value Ref Range   Squamous Epithelial / LPF MANY (*) RARE   WBC, UA TOO NUMEROUS TO COUNT  <3 WBC/hpf   RBC / HPF 3-6  <3 RBC/hpf   Bacteria, UA MANY (*) RARE   US Abdomen Limited Ruq  11/20/2013   CLINICAL DATA Epigastric pain with nausea and vomiting and diarrhea.  EXAM US ABDOMEN LIMITED - RIGHT UPPER QUADRANT  COMPARISON 10/16/2013  FINDINGS Gallbladder:  Gallbladder is not well distended on the current study. The tiny mobile gallstones seen on the previous exams are not evident on today's study. Gallbladder wall is irregular and appears thickened, measuring up to 4-5 mm in diameter. Hypoechoic striations within the gallbladder wall raise concern for gallbladder wall edema. No evidence for pericholecystic fluid. The sonographer reports a positive sonographic Murphy sign.  Common bile duct:  Diameter: Nondilated at 2-3 mm diameter.  Liver:  No focal lesion identified. Within normal limits in parenchymal echogenicity.  IMPRESSION Gallbladder wall appears thickened and somewhat irregular and sonographer reports a positive sonographic Murphy sign, both features raising concern for acute cholecystitis. Patient has had gallstones noted  on previous studies although they are not evident on this exam. The patient did eat shortly before this study and gallbladder is  not well distended which may accentuate gallbladder wall thickness. If the clinical picture is equivocal for acute cholecystitis, nuclear scintigraphy may prove helpful to further evaluate.  SIGNATURE  Electronically Signed   By: Misty Stanley M.D.   On: 11/20/2013 19:42    Review of Systems  Constitutional: Negative for fever and chills.  Respiratory: Negative for cough and shortness of breath.   Cardiovascular: Negative for chest pain, palpitations and leg swelling.  Gastrointestinal: Positive for nausea, vomiting, abdominal pain and diarrhea. Negative for blood in stool.  Genitourinary: Negative.   Musculoskeletal: Negative.     Blood pressure 97/61, pulse 83, temperature 98.3 F (36.8 C), temperature source Oral, resp. rate 15, last menstrual period 08/27/2013, SpO2 94.00%. Physical Exam  General: Alert, well-developed Caucasian female, in mild distress Skin: Warm and dry without rash or infection. HEENT: No palpable masses or thyromegaly. Sclera nonicteric. Pupils equal round and reactive. Oropharynx clear. Lymph nodes: No cervical, supraclavicular nodes palpable. Lungs: Breath sounds clear and equal with a few scattered wheezes, no increased work of breathing Cardiovascular: Regular rate and rhythm without murmur. No JVD or edema. Peripheral pulses intact. Abdomen: Nondistended.there is localized tenderness in the epigastrium and right upper quadrant with some guarding and no peritoneal signs. No masses palpable. No organomegaly. No palpable hernias. Extremities: No edema or joint swelling or deformity. Neurologic: Alert and fully oriented. Gait normal  Assessment/Plan Patient with documented cholelithiasis and persistently worsening epigastric and right upper quadrant abdominal pain. She is now at [redacted] weeks gestational age. She has pain that is not controlled while in the emergency department and possible evidence of gallbladder wall thickening suggesting early cholecystitis. No  gallstones seen on today's study however these have documented in the past. It does not appear that she is going to get by until her scheduled cholecystectomy in 2 weeks. The patient will be admitted for symptom control and plan urgent laparoscopic cholecystectomy.  Lila Lufkin T 11/20/2013, 9:48 PM

## 2013-11-20 NOTE — ED Provider Notes (Signed)
CSN: [id number]     Arrival date & time 11/20/13  1543 History   First MD Initiated Contact with Patient 11/20/13 1833     Chief Complaint  Patient presents with  . Abdominal Pain     (Consider location/radiation/quality/duration/timing/severity/associated sxs/prior Treatment) HPI Laree Garron is a 33 y.o. female who presents to emergency department complaining of abdominal pain. Patient states that her pain began this morning. States has known gallstones. States pain has been constant since this morning. Admits to associated nausea and vomiting. States pain is "unbearable." She states that she has a cholecystectomy scheduled in 2 weeks by Dr.Tsui.  Pain is sharp and is in the upper abdomen and radiating to bilateral flanks. Patient did not take any medications for this today. Patient states she is [redacted] weeks pregnant. She denies any lower abdominal pain, vaginal discharge, vaginal bleeding. Nothing makes her symptoms better or worse. She does have hx of GERD as well, takes prilosec.   Past Medical History  Diagnosis Date  . Narcotic abuse   . Anxiety   . Depression   . Chronic headaches   . H. pylori infection    Past Surgical History  Procedure Laterality Date  . Abortions    . Wisdom tooth extraction     Family History  Problem Relation Age of Onset  . Diabetes Mother   . Cancer Maternal Aunt   . Cancer Maternal Uncle   . Cancer Paternal Aunt   . Cancer Paternal Uncle    History  Substance Use Topics  . Smoking status: Current Every Day Smoker -- 1.00 packs/day for 15 years    Types: Cigarettes  . Smokeless tobacco: Never Used  . Alcohol Use: No     Comment: recovering addict   OB History   Grav Para Term Preterm Abortions TAB SAB Ect Mult Living   3 0   2 2    0     Review of Systems  Constitutional: Negative for fever and chills.  Respiratory: Negative for cough, chest tightness and shortness of breath.   Cardiovascular: Negative for chest pain, palpitations and  leg swelling.  Gastrointestinal: Positive for nausea, vomiting and abdominal pain. Negative for diarrhea.  Genitourinary: Negative for dysuria and flank pain.  Musculoskeletal: Negative for arthralgias, myalgias, neck pain and neck stiffness.  Skin: Negative for rash.  Neurological: Negative for dizziness, weakness and headaches.  All other systems reviewed and are negative.      Allergies  Review of patient's allergies indicates no known allergies.  Home Medications   Current Outpatient Rx  Name  Route  Sig  Dispense  Refill  . omeprazole (PRILOSEC) 40 MG capsule   Oral   Take 40 mg by mouth daily.         . Prenatal Vit-Fe Fumarate-FA (PRENATAL MULTIVITAMIN) TABS tablet   Oral   Take 1 tablet by mouth daily at 12 noon.         . promethazine (PHENERGAN) 12.5 MG tablet   Oral   Take 1 tablet (12.5 mg total) by mouth every 6 (six) hours as needed for nausea or vomiting.   30 tablet   3   . butalbital-acetaminophen-caffeine (FIORICET) 50-325-40 MG per tablet   Oral   Take 1-2 tablets by mouth 3 (three) times daily.   20 tablet   0   . calcium carbonate (TUMS - DOSED IN MG ELEMENTAL CALCIUM) 500 MG chewable tablet   Oral   Chew 6-8 tablets by mouth as needed  for indigestion or heartburn.          . dicyclomine (BENTYL) 20 MG tablet   Oral   Take 1 tablet (20 mg total) by mouth 4 (four) times daily -  before meals and at bedtime.   120 tablet   0   . HYDROcodone-acetaminophen (NORCO/VICODIN) 5-325 MG per tablet   Oral   Take 1 tablet by mouth every 6 (six) hours as needed for moderate pain.   40 tablet   0   . ondansetron (ZOFRAN ODT) 8 MG disintegrating tablet   Oral   Take 1 tablet (8 mg total) by mouth every 8 (eight) hours as needed for nausea or vomiting.   20 tablet   0   . oxyCODONE-acetaminophen (ROXICET) 5-325 MG per tablet   Oral   Take 1-2 tablets by mouth every 6 (six) hours as needed for severe pain.   30 tablet   0   . sucralfate  (CARAFATE) 1 G tablet   Oral   Take 1 g by mouth 4 (four) times daily -  with meals and at bedtime.          BP 97/61  Pulse 83  Temp(Src) 98.3 F (36.8 C) (Oral)  Resp 15  SpO2 94%  LMP 08/27/2013 Physical Exam  Nursing note and vitals reviewed. Constitutional: She appears well-developed and well-nourished. No distress.  HENT:  Head: Normocephalic.  Eyes: Conjunctivae are normal.  Neck: Neck supple.  Cardiovascular: Normal rate, regular rhythm and normal heart sounds.   Pulmonary/Chest: Effort normal and breath sounds normal. No respiratory distress. She has no wheezes. She has no rales.  Abdominal: Soft. Bowel sounds are normal. She exhibits no distension. There is tenderness. There is guarding. There is no rebound.  RUQ, LUQ tenderness.   Musculoskeletal: She exhibits no edema.  Neurological: She is alert.  Skin: Skin is warm and dry.  Psychiatric: She has a normal mood and affect. Her behavior is normal.    ED Course  Procedures (including critical care time) Labs Review Labs Reviewed  CBC WITH DIFFERENTIAL - Abnormal; Notable for the following:    WBC 12.7 (*)    Neutro Abs 7.9 (*)    All other components within normal limits  COMPREHENSIVE METABOLIC PANEL - Abnormal; Notable for the following:    Creatinine, Ser 0.49 (*)    Total Bilirubin <0.2 (*)    All other components within normal limits  URINALYSIS, ROUTINE W REFLEX MICROSCOPIC - Abnormal; Notable for the following:    APPearance CLOUDY (*)    Hgb urine dipstick TRACE (*)    Leukocytes, UA LARGE (*)    All other components within normal limits  URINE MICROSCOPIC-ADD ON - Abnormal; Notable for the following:    Squamous Epithelial / LPF MANY (*)    Bacteria, UA MANY (*)    All other components within normal limits  LIPASE, BLOOD   Imaging Review [place] Abdomen Limited Ruq  11/20/2013   CLINICAL DATA Epigastric pain with nausea and vomiting and diarrhea.  EXAM US ABDOMEN LIMITED - RIGHT UPPER QUADRANT   COMPARISON 10/16/2013  FINDINGS Gallbladder:  Gallbladder is not well distended on the current study. The tiny mobile gallstones seen on the previous exams are not evident on today's study. Gallbladder wall is irregular and appears thickened, measuring up to 4-5 mm in diameter. Hypoechoic striations within the gallbladder wall raise concern for gallbladder wall edema. No evidence for pericholecystic fluid. The sonographer reports a positive sonographic Murphy sign.  Common bile duct:  Diameter: Nondilated at 2-3 mm diameter.  Liver:  No focal lesion identified. Within normal limits in parenchymal echogenicity.  IMPRESSION Gallbladder wall appears thickened and somewhat irregular and sonographer reports a positive sonographic Murphy sign, both features raising concern for acute cholecystitis. Patient has had gallstones noted on previous studies although they are not evident on this exam. The patient did eat shortly before this study and gallbladder is not well distended which may accentuate gallbladder wall thickness. If the clinical picture is equivocal for acute cholecystitis, nuclear scintigraphy may prove helpful to further evaluate.  SIGNATURE  Electronically Signed   By: Kennith Center M.D.   On: 11/20/2013 19:42     EKG Interpretation None      MDM   Final diagnoses:  Cholecystitis  UTI (lower urinary tract infection)  Pregnancy   Patient with known gallbladder disease, presents with increased pain, nausea, vomiting. She is [redacted] weeks pregnant. She is not complaining of any pregnancy related issues. She denies any lower bowel pain, no vaginal bleeding or discharge. Will get patient's lab work, urinalysis, repeat ultrasound.  Patient's ultrasound suspicious for cholecystitis. Patient so far received Percocet and 1 mg of Dilaudid for her pain. I discussed this with Dr. Barrie Dunker, who recommended monitoring longer to see if pain will improve if not, he'll admit her. Patient's pain did not improve  very much. Patient was seen by Dr. Johna Sheriff admitted to the hospital. Pt also has a UTI, did not treat in ED.   Filed Vitals:   11/20/13 1705 11/20/13 1950 11/20/13 2230 11/20/13 2308  BP: 125/87 97/61 108/64   Pulse: 89 83 78   Temp: 98.3 F (36.8 C)  98 F (36.7 C)   TempSrc: Oral  Oral   Resp:  15 18   Height:    5\' 6"  (1.676 m)  Weight:    160 lb (72.576 kg)  SpO2: 100% 94% 96%        Zayden Maffei A Zyheir Daft, PA-C 11/21/13 0015

## 2013-11-20 NOTE — ED Notes (Signed)
Pt states hx of gallstones.  States that she woke up about 0600 w/ upper abd pain and the pain is now "unbearable".  NVD.  Also [redacted] wks pregnant.

## 2013-11-21 ENCOUNTER — Inpatient Hospital Stay (HOSPITAL_COMMUNITY): Payer: Medicaid Other | Admitting: Anesthesiology

## 2013-11-21 ENCOUNTER — Encounter (HOSPITAL_COMMUNITY): Payer: Medicaid Other | Admitting: Anesthesiology

## 2013-11-21 ENCOUNTER — Encounter: Payer: Medicaid Other | Admitting: Obstetrics & Gynecology

## 2013-11-21 ENCOUNTER — Encounter (HOSPITAL_COMMUNITY): Admission: EM | Disposition: A | Discharge status: Home / Self Care | Payer: Self-pay | Source: Home / Self Care

## 2013-11-21 DIAGNOSIS — K8010 Calculus of gallbladder with chronic cholecystitis without obstruction: Secondary | ICD-10-CM

## 2013-11-21 HISTORY — PX: CHOLECYSTECTOMY: SHX55

## 2013-11-21 SURGERY — LAPAROSCOPIC CHOLECYSTECTOMY WITH INTRAOPERATIVE CHOLANGIOGRAM
Anesthesia: General

## 2013-11-21 MED ORDER — ONDANSETRON HCL 4 MG/2ML IJ SOLN
4.0000 mg | Freq: Four times a day (QID) | INTRAMUSCULAR | Status: DC | PRN
  Administered 2013-11-21: 4 mg via INTRAVENOUS
  Filled 2013-11-21: qty 2

## 2013-11-21 MED ORDER — HYDROMORPHONE HCL PF 1 MG/ML IJ SOLN
INTRAMUSCULAR | Status: AC
  Administered 2013-11-21: 1 mg via INTRAVENOUS
  Filled 2013-11-21: qty 1

## 2013-11-21 MED ORDER — SUCCINYLCHOLINE CHLORIDE 20 MG/ML IJ SOLN
INTRAMUSCULAR | Status: DC | PRN
  Administered 2013-11-21: 100 mg via INTRAVENOUS

## 2013-11-21 MED ORDER — LACTATED RINGERS IR SOLN
Status: DC | PRN
  Administered 2013-11-21: 1000 mL

## 2013-11-21 MED ORDER — ONDANSETRON HCL 4 MG PO TABS: 4.0000 mg | ORAL_TABLET | Freq: Four times a day (QID) | ORAL | Status: DC | PRN

## 2013-11-21 MED ORDER — NICOTINE 14 MG/24HR TD PT24: 14.0000 mg | MEDICATED_PATCH | Freq: Every day | TRANSDERMAL | Status: DC

## 2013-11-21 MED ORDER — NEOSTIGMINE METHYLSULFATE 1 MG/ML IJ SOLN
INTRAMUSCULAR | Status: AC
  Filled 2013-11-21: qty 10

## 2013-11-21 MED ORDER — PROMETHAZINE HCL 25 MG/ML IJ SOLN
INTRAMUSCULAR | Status: AC
  Filled 2013-11-21: qty 1

## 2013-11-21 MED ORDER — BUPIVACAINE HCL (PF) 0.25 % IJ SOLN
INTRAMUSCULAR | Status: DC | PRN
  Administered 2013-11-21: 8 mL

## 2013-11-21 MED ORDER — DEXAMETHASONE SODIUM PHOSPHATE 10 MG/ML IJ SOLN
INTRAMUSCULAR | Status: DC | PRN
  Administered 2013-11-21: 10 mg via INTRAVENOUS

## 2013-11-21 MED ORDER — CEFAZOLIN SODIUM-DEXTROSE 2-3 GM-% IV SOLR
2.0000 g | INTRAVENOUS | Status: AC
  Administered 2013-11-21: 2 g via INTRAVENOUS
  Filled 2013-11-21: qty 50

## 2013-11-21 MED ORDER — KCL IN DEXTROSE-NACL 20-5-0.9 MEQ/L-%-% IV SOLN
INTRAVENOUS | Status: DC
  Filled 2013-11-21 (×2): qty 1000

## 2013-11-21 MED ORDER — FENTANYL CITRATE 0.05 MG/ML IJ SOLN
INTRAMUSCULAR | Status: AC
  Filled 2013-11-21: qty 2

## 2013-11-21 MED ORDER — PROPOFOL 10 MG/ML IV BOLUS
INTRAVENOUS | Status: AC
  Filled 2013-11-21: qty 20

## 2013-11-21 MED ORDER — PROPOFOL 10 MG/ML IV BOLUS
INTRAVENOUS | Status: DC | PRN
  Administered 2013-11-21: 150 mg via INTRAVENOUS

## 2013-11-21 MED ORDER — ROCURONIUM BROMIDE 100 MG/10ML IV SOLN
INTRAVENOUS | Status: AC
  Filled 2013-11-21: qty 1

## 2013-11-21 MED ORDER — BUPIVACAINE HCL (PF) 0.25 % IJ SOLN
INTRAMUSCULAR | Status: AC
  Filled 2013-11-21: qty 30

## 2013-11-21 MED ORDER — LIDOCAINE HCL (CARDIAC) 20 MG/ML IV SOLN
INTRAVENOUS | Status: DC | PRN
  Administered 2013-11-21: 100 mg via INTRAVENOUS

## 2013-11-21 MED ORDER — NEOSTIGMINE METHYLSULFATE 1 MG/ML IJ SOLN
INTRAMUSCULAR | Status: DC | PRN
  Administered 2013-11-21: 3 mg via INTRAVENOUS

## 2013-11-21 MED ORDER — BUPIVACAINE-EPINEPHRINE PF 0.25-1:200000 % IJ SOLN
INTRAMUSCULAR | Status: AC
  Filled 2013-11-21: qty 30

## 2013-11-21 MED ORDER — GLYCOPYRROLATE 0.2 MG/ML IJ SOLN
INTRAMUSCULAR | Status: DC | PRN
  Administered 2013-11-21: .5 mg via INTRAVENOUS

## 2013-11-21 MED ORDER — FENTANYL CITRATE 0.05 MG/ML IJ SOLN
INTRAMUSCULAR | Status: AC
  Filled 2013-11-21: qty 5

## 2013-11-21 MED ORDER — ROCURONIUM BROMIDE 100 MG/10ML IV SOLN
INTRAVENOUS | Status: DC | PRN
Start: 2013-11-21 — End: 2013-11-21
  Administered 2013-11-21: 20 mg via INTRAVENOUS
  Administered 2013-11-21: 5 mg via INTRAVENOUS

## 2013-11-21 MED ORDER — HYDROMORPHONE HCL PF 1 MG/ML IJ SOLN
INTRAMUSCULAR | Status: AC
  Filled 2013-11-21: qty 1

## 2013-11-21 MED ORDER — HYDROMORPHONE HCL PF 1 MG/ML IJ SOLN
1.0000 mg | INTRAMUSCULAR | Status: DC | PRN
  Administered 2013-11-21: 1 mg via INTRAVENOUS

## 2013-11-21 MED ORDER — LACTATED RINGERS IV SOLN: INTRAVENOUS | Status: DC

## 2013-11-21 MED ORDER — FENTANYL CITRATE 0.05 MG/ML IJ SOLN
INTRAMUSCULAR | Status: DC | PRN
  Administered 2013-11-21 (×5): 50 ug via INTRAVENOUS

## 2013-11-21 MED ORDER — DEXAMETHASONE SODIUM PHOSPHATE 10 MG/ML IJ SOLN
INTRAMUSCULAR | Status: AC
  Filled 2013-11-21: qty 1

## 2013-11-21 MED ORDER — HYDROCODONE-ACETAMINOPHEN 5-325 MG PO TABS
1.0000 | ORAL_TABLET | ORAL | Status: DC | PRN
  Administered 2013-11-21 – 2013-11-22 (×4): 2 via ORAL
  Filled 2013-11-21 (×4): qty 2

## 2013-11-21 MED ORDER — HYDROMORPHONE HCL PF 1 MG/ML IJ SOLN
0.5000 mg | INTRAMUSCULAR | Status: DC | PRN
  Administered 2013-11-21 (×3): 0.5 mg via INTRAVENOUS

## 2013-11-21 MED ORDER — FENTANYL CITRATE 0.05 MG/ML IJ SOLN
25.0000 ug | INTRAMUSCULAR | Status: DC | PRN
  Administered 2013-11-21 (×3): 50 ug via INTRAVENOUS

## 2013-11-21 MED ORDER — GLYCOPYRROLATE 0.2 MG/ML IJ SOLN
INTRAMUSCULAR | Status: AC
  Filled 2013-11-21: qty 3

## 2013-11-21 MED ORDER — LACTATED RINGERS IV SOLN
INTRAVENOUS | Status: DC | PRN
  Administered 2013-11-21: 10:00:00 via INTRAVENOUS

## 2013-11-21 MED ORDER — CEFAZOLIN SODIUM-DEXTROSE 2-3 GM-% IV SOLR
INTRAVENOUS | Status: AC
  Filled 2013-11-21: qty 50

## 2013-11-21 MED ORDER — PROMETHAZINE HCL 25 MG/ML IJ SOLN
12.5000 mg | INTRAMUSCULAR | Status: DC | PRN
  Administered 2013-11-21: 12.5 mg via INTRAVENOUS

## 2013-11-21 SURGICAL SUPPLY — 38 items
APPLIER CLIP 5 13 M/L LIGAMAX5 (MISCELLANEOUS)
BENZOIN TINCTURE PRP APPL 2/3 (GAUZE/BANDAGES/DRESSINGS) ×3 IMPLANT
CABLE HIGH FREQUENCY MONO STRZ (ELECTRODE) ×3 IMPLANT
CANISTER SUCTION 2500CC (MISCELLANEOUS) IMPLANT
CHLORAPREP W/TINT 26ML (MISCELLANEOUS) ×3 IMPLANT
CLIP APPLIE 5 13 M/L LIGAMAX5 (MISCELLANEOUS) IMPLANT
CLIP LIGATING HEMO O LOK GREEN (MISCELLANEOUS) ×6 IMPLANT
CLOSURE WOUND 1/2 X4 (GAUZE/BANDAGES/DRESSINGS) ×1
COVER MAYO STAND STRL (DRAPES) IMPLANT
COVER TRANSDUCER ULTRASND (DRAPES) ×3 IMPLANT
DECANTER SPIKE VIAL GLASS SM (MISCELLANEOUS) IMPLANT
DEVICE PMI PUNCTURE CLOSURE (MISCELLANEOUS) ×3 IMPLANT
DRAPE C-ARM 42X120 X-RAY (DRAPES) IMPLANT
DRAPE LAPAROSCOPIC ABDOMINAL (DRAPES) ×3 IMPLANT
ELECT REM PT RETURN 9FT ADLT (ELECTROSURGICAL) ×3
ELECTRODE REM PT RTRN 9FT ADLT (ELECTROSURGICAL) ×1 IMPLANT
GAUZE SPONGE 2X2 8PLY STRL LF (GAUZE/BANDAGES/DRESSINGS) ×1 IMPLANT
GLOVE BIO SURGEON STRL SZ7.5 (GLOVE) ×15 IMPLANT
GOWN STRL REUS W/TWL XL LVL3 (GOWN DISPOSABLE) ×9 IMPLANT
KIT BASIN OR (CUSTOM PROCEDURE TRAY) ×3 IMPLANT
NEEDLE INSUFFLATION 14GA 120MM (NEEDLE) ×3 IMPLANT
NS IRRIG 1000ML POUR BTL (IV SOLUTION) ×3 IMPLANT
RINGERS IRRIG 1000ML POUR BTL (IV SOLUTION) IMPLANT
SCISSORS LAP 5X35 DISP (ENDOMECHANICALS) ×3 IMPLANT
SET CHOLANGIOGRAPH MIX (MISCELLANEOUS) IMPLANT
SET IRRIG TUBING LAPAROSCOPIC (IRRIGATION / IRRIGATOR) ×3 IMPLANT
SLEEVE XCEL OPT CAN 5 100 (ENDOMECHANICALS) ×3 IMPLANT
SOLUTION ANTI FOG 6CC (MISCELLANEOUS) ×3 IMPLANT
SPONGE GAUZE 2X2 STER 10/PKG (GAUZE/BANDAGES/DRESSINGS) ×2
SPONGE GAUZE 4X4 12PLY (GAUZE/BANDAGES/DRESSINGS) ×3 IMPLANT
STRIP CLOSURE SKIN 1/2X4 (GAUZE/BANDAGES/DRESSINGS) ×2 IMPLANT
SUT MNCRL AB 4-0 PS2 18 (SUTURE) ×3 IMPLANT
TAPE CLOTH SURG 4X10 WHT LF (GAUZE/BANDAGES/DRESSINGS) ×3 IMPLANT
TOWEL OR 17X26 10 PK STRL BLUE (TOWEL DISPOSABLE) ×3 IMPLANT
TRAY LAP CHOLE (CUSTOM PROCEDURE TRAY) ×3 IMPLANT
TROCAR BLADELESS OPT 5 100 (ENDOMECHANICALS) ×3 IMPLANT
TROCAR XCEL NON-BLD 11X100MML (ENDOMECHANICALS) ×3 IMPLANT
TUBING INSUFFLATION 10FT LAP (TUBING) ×3 IMPLANT

## 2013-11-21 NOTE — Care Management Note (Signed)
    Page 1 of 1   11/21/2013     1:41:59 PM   CARE MANAGEMENT NOTE 11/21/2013  Patient:  [name] [name],[name] [name]   Account Number:  [id number]  Date Initiated:  11/21/2013  Documentation initiated by:  Lorenda IshiharaPEELE,Brittanyann Wittner  Subjective/Objective Assessment:   33 yo female admitted with cholecystitis, [redacted] wks pregnant. PTA lived at home with family.     Action/Plan:   Home when stable   Anticipated DC Date:  11/22/2013   Anticipated DC Plan:  HOME/SELF CARE      DC Planning Services  CM consult      Choice offered to / List presented to:             Status of service:  Completed, signed off Medicare Important Message given?   (If response is "NO", the following Medicare IM given date fields will be blank) Date Medicare IM given:   Date Additional Medicare IM given:    Discharge Disposition:  HOME/SELF CARE  Per UR Regulation:  Reviewed for med. necessity/level of care/duration of stay  If discussed at Long Length of Stay Meetings, dates discussed:    Comments:

## 2013-11-21 NOTE — ED Provider Notes (Signed)
Medical screening examination/treatment/procedure(s) were performed by non-physician practitioner and as supervising physician I was immediately available for consultation/collaboration.   EKG Interpretation None       Ethelda ChickMartha K Linker, MD 11/21/13 1512

## 2013-11-21 NOTE — Transfer of Care (Signed)
Immediate Anesthesia Transfer of Care Note  Patient: [name] [name]  Procedure(s) Performed: Procedure(s): LAPAROSCOPIC CHOLECYSTECTOMY  (N/A)  Patient Location: PACU  Anesthesia Type:General  Level of Consciousness: awake, alert , oriented and patient cooperative  Airway & Oxygen Therapy: Patient Spontanous Breathing and Patient connected to face mask oxygen  Post-op Assessment: Report given to PACU RN and Post -op Vital signs reviewed and stable  Post vital signs: Reviewed and stable  Complications: No apparent anesthesia complications

## 2013-11-21 NOTE — Progress Notes (Signed)
Subjective: Tearful, complaining of pain RUQ.  She would like a nicotine patch.  She would like to have surgery.  Objective: Vital signs in last 24 hours: Temp:  [98 F (36.7 C)-98.3 F (36.8 C)] 98 F (36.7 C) (03/12 0618) Pulse Rate:  [73-89] 73 (03/12 0618) Resp:  [15-18] 18 (03/12 0618) BP: (94-125)/(59-87) 94/60 mmHg (03/12 0618) SpO2:  [94 %-100 %] 98 % (03/12 0618) Weight:  [72.576 kg (160 lb)] 72.576 kg (160 lb) (03/11 2308) Last BM Date: 11/20/13 Npo  AFEBRILE, vss NO labs this AM but WBC consistently up Intake/Output from previous day: 03/11 0701 - 03/12 0700 In: 1606.7 [I.V.:1606.7] Out: 100 [Urine:100] Intake/Output this shift:    General appearance: alert, cooperative and no distress Resp: clear to auscultation bilaterally GI: soft, not distended, + BS, tender and tearful talking.  Lab Results:   Recent Labs  11/20/13 1623  WBC 12.7*  HGB 13.1  HCT 38.4  PLT 307    BMET  Recent Labs  11/20/13 1623  NA 138  K 4.3  CL 101  CO2 21  GLUCOSE 85  BUN 7  CREATININE 0.49*  CALCIUM 9.7   PT/INR No results found for this basename: LABPROT, INR,  in the last 72 hours   Recent Labs Lab 11/20/13 1623  AST 12  ALT 11  ALKPHOS 64  BILITOT <0.2*  PROT 7.3  ALBUMIN 4.0     Lipase     Component Value Date/Time   LIPASE 45 11/20/2013 1623     Studies/Results: [place] Abdomen Limited Ruq  11/20/2013   CLINICAL DATA Epigastric pain with nausea and vomiting and diarrhea.  EXAM US ABDOMEN LIMITED - RIGHT UPPER QUADRANT  COMPARISON 10/16/2013  FINDINGS Gallbladder:  Gallbladder is not well distended on the current study. The tiny mobile gallstones seen on the previous exams are not evident on today's study. Gallbladder wall is irregular and appears thickened, measuring up to 4-5 mm in diameter. Hypoechoic striations within the gallbladder wall raise concern for gallbladder wall edema. No evidence for pericholecystic fluid. The sonographer reports a  positive sonographic Murphy sign.  Common bile duct:  Diameter: Nondilated at 2-3 mm diameter.  Liver:  No focal lesion identified. Within normal limits in parenchymal echogenicity.  IMPRESSION Gallbladder wall appears thickened and somewhat irregular and sonographer reports a positive sonographic Murphy sign, both features raising concern for acute cholecystitis. Patient has had gallstones noted on previous studies although they are not evident on this exam. The patient did eat shortly before this study and gallbladder is not well distended which may accentuate gallbladder wall thickness. If the clinical picture is equivocal for acute cholecystitis, nuclear scintigraphy may prove helpful to further evaluate.  SIGNATURE  Electronically Signed   By: Kennith CenterEric  Mansell M.D.   On: 11/20/2013 19:42    Medications:    No current facility-administered medications on file prior to encounter.   Current Outpatient Prescriptions on File Prior to Encounter  Medication Sig Dispense Refill  . calcium carbonate (TUMS - DOSED IN MG ELEMENTAL CALCIUM) 500 MG chewable tablet Chew 6-8 tablets by mouth as needed for indigestion or heartburn.       Marland Kitchen. omeprazole (PRILOSEC) 40 MG capsule Take 40 mg by mouth daily.      Marland Kitchen. oxyCODONE-acetaminophen (ROXICET) 5-325 MG per tablet Take 1-2 tablets by mouth every 6 (six) hours as needed for severe pain.  30 tablet  0  . Prenatal Vit-Fe Fumarate-FA (PRENATAL MULTIVITAMIN) TABS tablet Take 1 tablet by mouth daily  at 12 noon.      . promethazine (PHENERGAN) 12.5 MG tablet Take 1 tablet (12.5 mg total) by mouth every 6 (six) hours as needed for nausea or vomiting.  30 tablet  3     Assessment/Plan  Abdominal pain since Nov, 2014 RUQ PAIN, CHOLELITHIASIS, CHOLECYSTITIS Week 12 Gestation  ANXIETY/DEPRESSION Hx of NARCOTIC ABUSE CHRONIC HEADACHE  HX OF H pYLORI  Ongoing tobacco use. SCD for DVT prophylaxis   Plan:  Nicotine patch , OR later today, Dr. Derrell Lolling will see and talk  with her also.    LOS: 1 day    Shedric Fredericks 11/21/2013

## 2013-11-21 NOTE — Progress Notes (Signed)
FHT check on pt, while in 1528. FHT 156, found in lower abdoment

## 2013-11-21 NOTE — Anesthesia Preprocedure Evaluation (Signed)
Anesthesia Evaluation  Patient identified by MRN, date of birth, ID band Patient awake    Reviewed: Allergy & Precautions, H&P , NPO status , Patient's Chart, lab work & pertinent test results  Airway Mallampati: II TM Distance: >3 FB Neck ROM: full    Dental no notable dental hx.    Pulmonary neg pulmonary ROS, Current Smoker,  breath sounds clear to auscultation  Pulmonary exam normal       Cardiovascular Exercise Tolerance: Good negative cardio ROS  Rhythm:regular Rate:Normal     Neuro/Psych negative neurological ROS  negative psych ROS   GI/Hepatic negative GI ROS, Neg liver ROS, (+)     substance abuse   , Narcotic abuse history   Endo/Other  negative endocrine ROS  Renal/GU negative Renal ROS  negative genitourinary   Musculoskeletal   Abdominal   Peds  Hematology negative hematology ROS (+)   Anesthesia Other Findings   Reproductive/Obstetrics negative OB ROS                           Anesthesia Physical Anesthesia Plan  ASA: II  Anesthesia Plan: General   Post-op Pain Management:    Induction: Intravenous  Airway Management Planned: Oral ETT  Additional Equipment:   Intra-op Plan:   Post-operative Plan: Extubation in OR  Informed Consent: I have reviewed the patients History and Physical, chart, labs and discussed the procedure including the risks, benefits and alternatives for the proposed anesthesia with the patient or authorized representative who has indicated his/her understanding and acceptance.   Dental Advisory Given  Plan Discussed with: CRNA and Surgeon  Anesthesia Plan Comments:         Anesthesia Quick Evaluation

## 2013-11-21 NOTE — Op Note (Signed)
11/20/2013 - 11/21/2013  11:19 AM  PATIENT:  [name] [name]  33 y.o. female  PRE-OPERATIVE DIAGNOSIS:  cholecystitis  POST-OPERATIVE DIAGNOSIS:  acute cholecystitis  PROCEDURE:  Procedure(s): LAPAROSCOPIC CHOLECYSTECTOMY  (N/A)  SURGEON:  Surgeon(s) and Role:    * Axel FillerArmando Royale Swamy, MD - Primary  PHYSICIAN ASSISTANT:   ASSISTANTS: none   ANESTHESIA:   local and general  EBL:  Total I/O In: 900 [I.V.:900] Out: 150 [Urine:150]  BLOOD ADMINISTERED:none  DRAINS: none   LOCAL MEDICATIONS USED:  BUPIVICAINE   SPECIMEN:  Source of Specimen:  gallbladder  DISPOSITION OF SPECIMEN:  PATHOLOGY  COUNTS:  YES  TOURNIQUET:  * No tourniquets in log *  DICTATION: .Dragon Dictation  Details of the procedure: The patient was taken to the operating and placed in the supine position with bilateral SCDs in place. A time out was called and all facts were verified. A pneumoperitoneum was obtained via A Veress needle technique to a pressure of 14mm of mercury. A 5mm trochar was then placed in the right upper quadrant under visualization, and there were no injuries to any abdominal organs. A 11 mm port was then placed in the umbilical region after infiltrating with local anesthesia under direct visualization. A second epigastric port was placed under direct visualization. The gallbladder was identified and retracted, the peritoneum was then sharply dissected from the gallbladder and this dissection was carried down to Calot's triangle. The cystic duct was identified and stripped away circumferentially and seen going into the gallbladder 360, the critical angle was obtained. It was noted to be very dilated and large. 2 clips were placed proximally one distally and the cystic duct transected. The cystic artery was identified and 2 clips placed proximally and one distally and transected. We then proceeded to remove the gallbladder off the hepatic fossa with Bovie cautery. A retrieval bag was then placed  in the abdomen and gallbladder placed in the bag. The hepatic fossa was then reexamined and hemostasis was achieved with Bovie cautery and was excellent at this portion of the case. The subhepatic fossa and perihepatic fossa was then irrigated until the effluent was clear. The 11 mm trocar fascia was reapproximated with the Endo Close #1 Vicryl x2. The pneumoperitoneum was evacuated and all trochars removed under direct visulalization. The skin was then closed with 4-0 Monocryl and the skin dressed with Steri-Strips, gauze, and tape. The patient was awaken from general anesthesia and taken to the recovery room in stable condition.  PLAN OF CARE: Admit for overnight observation  PATIENT DISPOSITION:  PACU - hemodynamically stable.   Delay start of Pharmacological VTE agent (>24hrs) due to surgical blood loss or risk of bleeding: yes

## 2013-11-21 NOTE — Progress Notes (Signed)
I have seen and examined the pt and agree with PA-Jenning's progress note. To OR for lap chole All risks and benefits were discussed with the patient to generally include: infection, bleeding, possible need for post op ERCP, damage to the bile ducts, and bile leak. Alternatives were offered and described.  All questions were answered and the patient voiced understanding of the procedure and wishes to proceed at this point with a laparoscopic cholecystectomy.  I also discussed that there is a risk of miscarriage associated with anesthesia  And surgery.  She understands and has spoken with her OB MD as per the chart as to those same risks to her a dn her fetus.  She wishes to proceed.

## 2013-11-21 NOTE — Discharge Summary (Signed)
Physician Discharge Summary  Patient ID: [name] [name] MRN: [id number] DOB/AGE: 03/17/1981 33 y.o.  Admit date: 11/20/2013 Discharge date: 11/21/2013  Admission Diagnoses:  Abdominal pain since Nov, 2014  RUQ PAIN, CHOLELITHIASIS, CHOLECYSTITIS  Week 12 Gestation  ANXIETY/DEPRESSION  Hx of NARCOTIC ABUSE  CHRONIC HEADACHE  HX OF H pYLORI  Ongoing tobacco use.   Discharge Diagnoses:  Abdominal pain since Nov, 2014  RUQ PAIN, CHOLELITHIASIS, CHOLECYSTITIS  Week 12 Gestation  ANXIETY/DEPRESSION  Hx of NARCOTIC ABUSE  CHRONIC HEADACHE  HX OF H pYLORI  Ongoing tobacco use.  Active Problems:   Cholelithiasis with acute and chronic cholecystitis   PROCEDURES: LAPAROSCOPIC CHOLECYSTECTOMY, 11/21/13. Dr. Jerene Dilling Course:   33 year old female who has been having worsening upper, pain since late last year. She initially was found to be H. Pylori positive and was treated for this. However she developed worsening postprandial epigastric pain and in January was found to have gallstones. At about this time she was also found to be pregnant. She was seen in our office at the end of January with ongoing episodic postprandial epigastric pain. She was felt to have cholelithiasis and cholecystitis. She had not yet had a prostatic evaluation. Since then she has had her initial obstetrical evaluation with no other complicating factors found that her pregnancy. She is now approximately [redacted] weeks gestation. She has had steadily worsening pain over the last several weeks. She has been using Percocet on a daily basis but came to the emergency room today as this is no longer controlling the pain. She's having episodic vomiting. Some diarrhea. Denies fever chills or jaundice. She was treated with pain medication in the emergency department today but feels this just took the edge off and she has having continued pain. She describes sharp and pressure-like pain in her epigastrium and right upper  quadrant. She was seen and admitted by Dr. Johna Sheriff.  Dr. Derrell Lolling discussed with Dr.  Corliss Skains and they opted to go forward with surgery on 11/21/13.  She continued to have pain and be tearful, with her condition the following AM.  She was taken to the OR later that morning by Dr. Derrell Lolling.  She did well with her surgery, but still had some pain issues.  She advanced her diet and was ready for discharge the following day.  She complained of discomfort voiding the post op day, her urine was showing allot of bacteria and TNTC wbc on her urine on 11/20/12.  I started her on Keflex, and repeat urine was clear.  She left before we saw the repeat urine which was clean.  I called her cell phone [phone] (Mobile) and instructed her to hold onto the prescription for Keflex till we got the culture back.  She is to call the office on Monday. Condition on d/c:  Improved.     Disposition: 01-Home or Self Care   Future Appointments Provider Department Dept Phone   11/26/2013 9:00 AM [facility] [place] [facility] [phone]   11/26/2013 10:00 AM [facility] [phone]       Medication List    ASK your doctor about these medications       calcium carbonate 500 MG chewable tablet  Commonly known as:  TUMS - dosed in mg elemental calcium  Chew 6-8 tablets by mouth as needed for indigestion or heartburn.     omeprazole 40 MG capsule  Commonly known as:  PRILOSEC  Take 40 mg  by mouth daily.     oxyCODONE-acetaminophen 5-325 MG per tablet  Commonly known as:  ROXICET  Take 1-2 tablets by mouth every 6 (six) hours as needed for severe pain.     prenatal multivitamin Tabs tablet  Take 1 tablet by mouth daily at 12 noon.     promethazine 12.5 MG tablet  Commonly known as:  PHENERGAN  Take 1 tablet (12.5 mg total) by mouth every 6 (six) hours as needed for nausea or vomiting.         SignedSherrie George: [name] 11/21/2013, 2:03 PM

## 2013-11-21 NOTE — Addendum Note (Signed)
Addendum created 11/21/13 1244 by Doran ClayStephen R Kayela Humphres, CRNA   Modules edited: Anesthesia LDA

## 2013-11-21 NOTE — Anesthesia Postprocedure Evaluation (Signed)
  Anesthesia Post-op Note  Patient: [name] PaulsJessica Robel  Procedure(s) Performed: Procedure(s) (LRB): LAPAROSCOPIC CHOLECYSTECTOMY  (N/A)  Patient Location: PACU  Anesthesia Type: General  Level of Consciousness: awake and alert   Airway and Oxygen Therapy: Patient Spontanous Breathing  Post-op Pain: mild  Post-op Assessment: Post-op Vital signs reviewed, Patient's Cardiovascular Status Stable, Respiratory Function Stable, Patent Airway and No signs of Nausea or vomiting  Last Vitals:  Filed Vitals:   11/21/13 0618  BP: 94/60  Pulse: 73  Temp: 36.7 C  Resp: 18    Post-op Vital Signs: stable   Complications: No apparent anesthesia complications

## 2013-11-21 NOTE — Progress Notes (Signed)
[facility]. Pt awake but groggy. FH found @148  in lower abdomen with hand-held doppler.

## 2013-11-21 NOTE — Progress Notes (Signed)
Fetal heart tones assessed. Fetal heart rate is 174bpm.

## 2013-11-22 ENCOUNTER — Encounter (HOSPITAL_COMMUNITY): Payer: Self-pay | Admitting: General Surgery

## 2013-11-22 ENCOUNTER — Encounter: Payer: Self-pay | Admitting: General Surgery

## 2013-11-22 LAB — URINALYSIS, ROUTINE W REFLEX MICROSCOPIC
Bilirubin Urine: NEGATIVE
Glucose, UA: NEGATIVE mg/dL
Hgb urine dipstick: NEGATIVE
KETONES UR: NEGATIVE mg/dL
Leukocytes, UA: NEGATIVE
NITRITE: NEGATIVE
Protein, ur: NEGATIVE mg/dL
Specific Gravity, Urine: 1.009 (ref 1.005–1.030)
UROBILINOGEN UA: 0.2 mg/dL (ref 0.0–1.0)
pH: 7 (ref 5.0–8.0)

## 2013-11-22 MED ORDER — CIPROFLOXACIN HCL 500 MG PO TABS: 500.0000 mg | ORAL_TABLET | Freq: Two times a day (BID) | ORAL | Status: DC

## 2013-11-22 MED ORDER — CEPHALEXIN 500 MG PO CAPS: 500.0000 mg | ORAL_CAPSULE | Freq: Two times a day (BID) | ORAL | Status: DC

## 2013-11-22 MED ORDER — IBUPROFEN 200 MG PO TABS: ORAL_TABLET | ORAL | Status: DC

## 2013-11-22 MED ORDER — OXYCODONE-ACETAMINOPHEN 5-325 MG PO TABS: 1.0000 | ORAL_TABLET | Freq: Four times a day (QID) | ORAL | Status: DC | PRN

## 2013-11-22 MED ORDER — CEPHALEXIN 500 MG PO CAPS
500.0000 mg | ORAL_CAPSULE | Freq: Two times a day (BID) | ORAL | Status: DC
  Administered 2013-11-22: 500 mg via ORAL
  Filled 2013-11-22 (×2): qty 1

## 2013-11-22 NOTE — Progress Notes (Signed)
I have seen and examined the pt and agree with PA-Jenning's progress note. OK for Home today

## 2013-11-22 NOTE — Discharge Instructions (Signed)
Laparoscopic Cholecystectomy Laparoscopic cholecystectomy is surgery to remove the gallbladder. The gallbladder is located in the upper right part of the abdomen, behind the liver. It is a storage sac for bile produced in the liver. Bile aids in the digestion and absorption of fats. Cholecystectomy is often done for inflammation of the gallbladder (cholecystitis). This condition is usually caused by a buildup of gallstones (cholelithiasis) in your gallbladder. Gallstones can block the flow of bile, resulting in inflammation and pain. In severe cases, emergency surgery may be required. When emergency surgery is not required, you will have time to preLaparoscopic Cholecystectomy, Care After Refer to this sheet in the next few weeks. These instructions provide you with information on caring for yourself after your procedure. Your health care provider may also give you more specific instructions. Your treatment has been planned according to current medical practices, but problems sometimes occur. Call your health care provider if you have any problems or questions after your procedure. WHAT TO EXPECT AFTER THE PROCEDURE After your procedure, it is typical to have the following:  Pain at your incision sites. You will be given pain medicines to control the pain.  Mild nausea or vomiting. This should improve after the first 24 hours.  Bloating and possibly shoulder pain from the gas used during the procedure. This will improve after the first 24 hours. HOME CARE INSTRUCTIONS   Change bandages (dressings) as directed by your health care provider.  Keep the wound dry and clean. You may wash the wound gently with soap and water. Gently blot or dab the area dry.  Do not take baths or use swimming pools or hot tubs for 2 weeks or until your health care provider approves.  Only take over-the-counter or prescription medicines as directed by your health care provider.  Continue your normal diet as directed by  your health care provider.  Do not lift anything heavier than 10 pounds (4.5 kg) until your health care provider approves.  Do not play contact sports for 1 week or until your health care provider approves. SEEK MEDICAL CARE IF:   You have redness, swelling, or increasing pain in the wound.  You notice yellowish-white fluid (pus) coming from the wound.  You have drainage from the wound that lasts longer than 1 day.  You notice a bad smell coming from the wound or dressing.  Your surgical cuts (incisions) break open. SEEK IMMEDIATE MEDICAL CARE IF:   You develop a rash.  You have difficulty breathing.  You have chest pain.  You have a fever.  You have increasing pain in the shoulders (shoulder strap areas).  You have dizzy episodes or faint while standing.  You have severe abdominal pain.  You feel sick to your stomach (nauseous) or throw up (vomit) and this lasts for more than 1 day. Document Released: 08/29/2005 Document Revised: 06/19/2013 Document Reviewed: 04/10/2013 [facility] Patient Information 2014 [place], [place]. pare for the procedure. Laparoscopic surgery is an alternative to open surgery. Laparoscopic surgery has a shorter recovery time. Your common bile duct may also need to be examined during the procedure. If stones are found in the common bile duct, they may be removed. LET [facility] CARE PROVIDER KNOW ABOUT:  Any allergies you have.  All medicines you are taking, including vitamins, herbs, eye drops, creams, and over-the-counter medicines.  Previous problems you or members of your family have had with the use of anesthetics.  Any blood disorders you have.  Previous surgeries you have had.  Medical  conditions you have. RISKS AND COMPLICATIONS Generally, this is a safe procedure. However, as with any procedure, complications can occur. Possible complications include:  Infection.  Damage to the common bile duct, nerves, arteries, veins, or other  internal organs such as the stomach, liver, or intestines.  Bleeding.  A stone may remain in the common bile duct.  A bile leak from the cyst duct that is clipped when your gallbladder is removed.  The need to convert to open surgery, which requires a larger incision in the abdomen. This may be necessary if your surgeon thinks it is not safe to continue with a laparoscopic procedure. BEFORE THE PROCEDURE  Ask your health care provider about changing or stopping any regular medicines. You will need to stop taking aspirin or blood thinners at least 5 days prior to surgery.  Do not eat or drink anything after midnight the night before surgery.  Let your health care provider know if you develop a cold or other infectious problem before surgery. PROCEDURE   You will be given medicine to make you sleep through the procedure (general anesthetic). A breathing tube will be placed in your mouth.  When you are asleep, your surgeon will make several small cuts (incisions) in your abdomen.  A thin, lighted tube with a tiny camera on the end (laparoscope) is inserted through one of the small incisions. The camera on the laparoscope sends a picture to a TV screen in the operating room. This gives the surgeon a good view inside your abdomen.  A gas will be pumped into your abdomen. This expands your abdomen so that the surgeon has more room to perform the surgery.  Other tools needed for the procedure are inserted through the other incisions. The gallbladder is removed through one of the incisions.  After the removal of your gallbladder, the incisions will be closed with stitches, staples, or skin glue. AFTER THE PROCEDURE  You will be taken to a recovery area where your progress will be checked often.  You may be allowed to go home the same day if your pain is controlled and you can tolerate liquids. Document Released: 08/29/2005 Document Revised: 06/19/2013 Document Reviewed:  04/10/2013 [facility] Patient Information 2014 [place].  CCS ______[facility] LAPAROSCOPIC SURGERY: POST OP INSTRUCTIONS Always review your discharge instruction sheet given to you by the facility where your surgery was performed. IF YOU HAVE DISABILITY OR FAMILY LEAVE FORMS, YOU MUST BRING THEM TO THE OFFICE FOR PROCESSING.   DO NOT GIVE THEM TO YOUR DOCTOR.  1. A prescription for pain medication may be given to you upon discharge.  Take your pain medication as prescribed, if needed.  If narcotic pain medicine is not needed, then you may take acetaminophen (Tylenol) or ibuprofen (Advil) as needed. 2. Take your usually prescribed medications unless otherwise directed. 3. If you need a refill on your pain medication, please contact your pharmacy.  They will contact our office to request authorization. Prescriptions will not be filled after 5pm or on week-ends. 4. You should follow a light diet the first few days after arrival home, such as soup and crackers, etc.  Be sure to include lots of fluids daily. 5. Most patients will experience some swelling and bruising in the area of the incisions.  Ice packs will help.  Swelling and bruising can take several days to resolve.  6. It is common to experience some constipation if taking pain medication after surgery.  Increasing fluid intake and taking  a stool softener (such as Colace) will usually help or prevent this problem from occurring.  A mild laxative (Milk of Magnesia or Miralax) should be taken according to package instructions if there are no bowel movements after 48 hours. 7. Unless discharge instructions indicate otherwise, you may remove your bandages 24-48 hours after surgery, and you may shower at that time.  You may have steri-strips (small skin tapes) in place directly over the incision.  These strips should be left on the skin for 7-10 days.  If your surgeon used skin glue on the incision, you may shower in 24 hours.   The glue will flake off over the next 2-3 weeks.  Any sutures or staples will be removed at the office during your follow-up visit. 8. ACTIVITIES:  You may resume regular (light) daily activities beginning the next day--such as daily self-care, walking, climbing stairs--gradually increasing activities as tolerated.  You may have sexual intercourse when it is comfortable.  Refrain from any heavy lifting or straining until approved by your doctor. a. You may drive when you are no longer taking prescription pain medication, you can comfortably wear a seatbelt, and you can safely maneuver your car and apply brakes. b. RETURN TO WORK:  __________________________________________________________ 9. You should see your doctor in the office for a follow-up appointment approximately 2-3 weeks after your surgery.  Make sure that you call for this appointment within a day or two after you arrive home to insure a convenient appointment time. 10. OTHER INSTRUCTIONS: __________________________________________________________________________________________________________________________ __________________________________________________________________________________________________________________________ WHEN TO CALL YOUR DOCTOR: 1. Fever over 101.0 2. Inability to urinate 3. Continued bleeding from incision. 4. Increased pain, redness, or drainage from the incision. 5. Increasing abdominal pain  The clinic staff is available to answer your questions during regular business hours.  Please dont hesitate to call and ask to speak to one of the nurses for clinical concerns.  If you have a medical emergency, go to the nearest emergency room or call 911.  A surgeon from [facility] is always on call at the hospital. [address], [address], [place], [place]  [address] ? [address] [address], [place], [place]   [address] [phone] ? [phone] ? FAX [phone] Web site:  [email or web address]  You Can Quit Smoking If you are ready to quit smoking or are thinking about it, congratulations! You have chosen to help yourself be healthier and live longer! There are lots of different ways to quit smoking. Nicotine gum, nicotine patches, a nicotine inhaler, or nicotine nasal spray can help with physical craving. Hypnosis, support groups, and medicines help break the habit of smoking. TIPS TO GET OFF AND STAY OFF CIGARETTES  Learn to predict your moods. Do not let a bad situation be your excuse to have a cigarette. Some situations in your life might tempt you to have a cigarette.  Ask friends and co-workers not to smoke around you.  Make your home smoke-free.  Never have "just one" cigarette. It leads to wanting another and another. Remind yourself of your decision to quit.  On a card, make a list of your reasons for not smoking. Read it at least the same number of times a day as you have a cigarette. Tell yourself everyday, "I do not want to smoke. I choose not to smoke."  Ask someone at home or work to help you with your plan to quit smoking.  Have something planned after you eat or have a cup of coffee. Take a walk or  get other exercise to perk you up. This will help to keep you from overeating.  Try a relaxation exercise to calm you down and decrease your stress. Remember, you may be tense and nervous the first two weeks after you quit. This will pass.  Find new activities to keep your hands busy. Play with a pen, coin, or rubber band. Doodle or draw things on paper.  Brush your teeth right after eating. This will help cut down the craving for the taste of tobacco after meals. You can try mouthwash too.  Try gum, breath mints, or diet candy to keep something in your mouth. IF YOU SMOKE AND WANT TO QUIT:  Do not stock up on cigarettes. Never buy a carton. Wait until one pack is finished before you buy another.  Never carry cigarettes with you at  work or at home.  Keep cigarettes as far away from you as possible. Leave them with someone else.  Never carry matches or a lighter with you.  Ask yourself, "Do I need this cigarette or is this just a reflex?"  Bet with someone that you can quit. Put cigarette money in a piggy bank every morning. If you smoke, you give up the money. If you do not smoke, by the end of the week, you keep the money.  Keep trying. It takes 21 days to change a habit!  Talk to your doctor about using medicines to help you quit. These include nicotine replacement gum, lozenges, or skin patches. Document Released: 06/25/2009 Document Revised: 11/21/2011 Document Reviewed: 06/25/2009 [facility] Patient Information 2014 [place], [place].  Smoking Hazards Smoking cigarettes is extremely bad for your health. Tobacco smoke has over 200 known poisons in it. It contains the poisonous gases nitrogen oxide and carbon monoxide. There are over 60 chemicals in tobacco smoke that cause cancer. Some of the chemicals found in cigarette smoke include:   Cyanide.   Benzene.   Formaldehyde.   Methanol (wood alcohol).   Acetylene (fuel used in welding torches).   Ammonia.  Even smoking lightly shortens your life expectancy by several years. You can greatly reduce the risk of medical problems for you and your family by stopping now. Smoking is the most preventable cause of death and disease in our society. Within days of quitting smoking, your circulation improves, you decrease the risk of having a heart attack, and your lung capacity improves. There may be some increased phlegm in the first few days after quitting, and it may take months for your lungs to clear up completely. Quitting for 10 years reduces your risk of developing lung cancer to almost that of a nonsmoker.  WHAT ARE THE RISKS OF SMOKING? Cigarette smokers have an increased risk of many serious medical problems, including:  Lung cancer.   Lung disease  (such as pneumonia, bronchitis, and emphysema).   Heart attack and chest pain due to the heart not getting enough oxygen (angina).   Heart disease and peripheral blood vessel disease.   Hypertension.   Stroke.   Oral cancer (cancer of the lip, mouth, or voice box).   Bladder cancer.   Pancreatic cancer.   Cervical cancer.   Pregnancy complications, including premature birth.   Stillbirths and smaller newborn babies, birth defects, and genetic damage to sperm.   Early menopause.   Lower estrogen level for women.   Infertility.   Facial wrinkles.   Blindness.   Increased risk of broken bones (fractures).   Senile dementia.   Stomach ulcers and  internal bleeding.   Delayed wound healing and increased risk of complications during surgery. Because of secondhand smoke exposure, children of smokers have an increased risk of the following:   Sudden infant death syndrome (SIDS).   Respiratory infections.   Lung cancer.   Heart disease.   Ear infections.  WHY IS SMOKING ADDICTIVE? Nicotine is the chemical agent in tobacco that is capable of causing addiction or dependence. When you smoke and inhale, nicotine is absorbed rapidly into the bloodstream through your lungs. Both inhaled and noninhaled nicotine may be addictive.  WHAT ARE THE BENEFITS OF QUITTING?  There are many health benefits to quitting smoking. Some are:   The likelihood of developing cancer and heart disease decreases. Health improvements are seen almost immediately.   Blood pressure, pulse rate, and breathing patterns start returning to normal soon after quitting.   People who quit may see an improvement in their overall quality of life.  HOW DO YOU QUIT SMOKING? Smoking is an addiction with both physical and psychological effects, and longtime habits can be hard to change. Your health care provider can recommend:  Programs and community resources, which may include  group support, education, or therapy.  Replacement products, such as patches, gum, and nasal sprays. Use these products only as directed. Do not replace cigarette smoking with electronic cigarettes (commonly called e-cigarettes). The safety of e-cigarettes is unknown, and some may contain harmful chemicals. FOR MORE INFORMATION  American Lung Association: www.lung.org  American Cancer Society: www.cancer.org Document Released: 10/06/2004 Document Revised: 06/19/2013 Document Reviewed: 02/18/2013 [facility] Patient Information 2014 [place], [place].

## 2013-11-22 NOTE — Progress Notes (Signed)
FHR heard 1 sec or less, RN unable to hear FHR again after 30 min of continuous listening and searching with doppler.  FP, Dr. Shawnie PonsPratt notified.  Orders for RN to try again in a little while.

## 2013-11-22 NOTE — Progress Notes (Signed)
11/22/13  1215  Reviewed discharge instructions with patient. Patient verbalized understanding of discharge instructions. Advised patient to take Tylenol instead of Ibuprofen d/t pregnancy.  Copy of discharge papers, prescriptions, and work note given to patient.

## 2013-11-22 NOTE — Progress Notes (Signed)
1 Day Post-Op  Subjective: She is complaining of being very sore.  Ate some breakfast.  She is walking some and is voiding some discomfort with voiding.   Objective: Vital signs in last 24 hours: Temp:  [97.6 F (36.4 C)-98.8 F (37.1 C)] 98.8 F (37.1 C) (03/13 0623) Pulse Rate:  [68-89] 83 (03/13 0623) Resp:  [15-23] 16 (03/13 0623) BP: (112-151)/(71-88) 118/72 mmHg (03/13 0623) SpO2:  [97 %-100 %] 97 % (03/13 0623) Last BM Date: 11/20/13 Diet: regular Afebrile,VSS No labs UA on admit shows bacteria and WBC. Intake/Output from previous day: 03/12 0701 - 03/13 0700 In: 2050 [I.V.:2050] Out: 1925 [Urine:1925] Intake/Output this shift:    General appearance: alert, cooperative, no distress and pain complaints are her major issue GI: soft, + BS, port sites look fine.  Lab Results:   Recent Labs  11/20/13 1623  WBC 12.7*  HGB 13.1  HCT 38.4  PLT 307    BMET  Recent Labs  11/20/13 1623  NA 138  K 4.3  CL 101  CO2 21  GLUCOSE 85  BUN 7  CREATININE 0.49*  CALCIUM 9.7   PT/INR No results found for this basename: LABPROT, INR,  in the last 72 hours   Recent Labs Lab 11/20/13 1623  AST 12  ALT 11  ALKPHOS 64  BILITOT <0.2*  PROT 7.3  ALBUMIN 4.0     Lipase     Component Value Date/Time   LIPASE 45 11/20/2013 1623     Studies/Results: [place] Abdomen Limited Ruq  11/20/2013   CLINICAL DATA Epigastric pain with nausea and vomiting and diarrhea.  EXAM US ABDOMEN LIMITED - RIGHT UPPER QUADRANT  COMPARISON 10/16/2013  FINDINGS Gallbladder:  Gallbladder is not well distended on the current study. The tiny mobile gallstones seen on the previous exams are not evident on today's study. Gallbladder wall is irregular and appears thickened, measuring up to 4-5 mm in diameter. Hypoechoic striations within the gallbladder wall raise concern for gallbladder wall edema. No evidence for pericholecystic fluid. The sonographer reports a positive sonographic Murphy sign.   Common bile duct:  Diameter: Nondilated at 2-3 mm diameter.  Liver:  No focal lesion identified. Within normal limits in parenchymal echogenicity.  IMPRESSION Gallbladder wall appears thickened and somewhat irregular and sonographer reports a positive sonographic Murphy sign, both features raising concern for acute cholecystitis. Patient has had gallstones noted on previous studies although they are not evident on this exam. The patient did eat shortly before this study and gallbladder is not well distended which may accentuate gallbladder wall thickness. If the clinical picture is equivocal for acute cholecystitis, nuclear scintigraphy may prove helpful to further evaluate.  SIGNATURE  Electronically Signed   By: Kennith CenterEric  Mansell M.D.   On: 11/20/2013 19:42    Medications:    Assessment/Plan Abdominal pain since Nov, 2014  RUQ PAIN, CHOLELITHIASIS, CHOLECYSTITIS/LAPAROSCOPIC CHOLECYSTECTOMY, 11/21/13. Dr. Axel FillerArmando Ramirez Week 12 Gestation  ANXIETY/DEPRESSION  Hx of NARCOTIC ABUSE  CHRONIC HEADACHE  HX OF H pYLORI  Ongoing tobacco use. Possible UTI   PLan:  Home today, follow up with OB. Pt instructed, to stop smoking although she has no intentions currently of stopping. Ibuprofen and  Percocet post op for pain. I will get a culture of her urine repeat UA and start her on 3 day course of Cipro, for possible UTI. SHE did not mention this yesterday.  LOS: 2 days    [name] [name] 11/22/2013

## 2013-11-22 NOTE — Progress Notes (Signed)
Pt urinating frequently with complaints of pain and stating that she feels like she is not emptying her bladder. Pt bladder scanned and of urine resulted. Pt assisted to bathroom to and of clear, yellow urine voided. Will notify MD of pt's complaints.

## 2013-11-22 NOTE — Progress Notes (Signed)
FHR dopplered x 1 min.

## 2013-11-23 LAB — URINE CULTURE
Colony Count: NO GROWTH
Culture: NO GROWTH

## 2013-11-25 ENCOUNTER — Telehealth (INDEPENDENT_AMBULATORY_CARE_PROVIDER_SITE_OTHER): Payer: Self-pay

## 2013-11-25 ENCOUNTER — Telehealth (INDEPENDENT_AMBULATORY_CARE_PROVIDER_SITE_OTHER): Payer: Self-pay | Admitting: *Deleted

## 2013-11-25 ENCOUNTER — Other Ambulatory Visit (INDEPENDENT_AMBULATORY_CARE_PROVIDER_SITE_OTHER): Payer: Self-pay

## 2013-11-25 NOTE — Telephone Encounter (Addendum)
Pt called with increased pain around her belly button.  I recommended her to rotate her pain med with ibuprofen, then pt stated she was pregnant. I advised her to contact her OBGYN to verify what they would allow her to take as far as a substitute for ibuprofen.  Pt understand and is in agreeance.Victorino Dike.Wendolyn Raso

## 2013-11-25 NOTE — Telephone Encounter (Signed)
Called and left message for patient RE:  Urine culture results Negative, patient does not need Keflex

## 2013-11-25 NOTE — Telephone Encounter (Signed)
Message copied by Maryan PulsMOORE, Lauranne Beyersdorf on Mon Nov 25, 2013  1:06 PM ------      Message from: Sherrie GeorgeJENNINGS, WILLARD      Created: Mon Nov 25, 2013 10:20 AM       Urine culture is negative, she does not need Keflex. ------

## 2013-11-26 ENCOUNTER — Ambulatory Visit (HOSPITAL_COMMUNITY)
Admission: RE | Admit: 2013-11-26 | Discharge: 2013-11-26 | Disposition: A | Discharge status: Home / Self Care | Payer: Medicaid Other | Source: Ambulatory Visit | Attending: Obstetrics & Gynecology | Admitting: Obstetrics & Gynecology

## 2013-11-26 ENCOUNTER — Encounter (HOSPITAL_COMMUNITY): Payer: Self-pay

## 2013-11-26 VITALS — BP 117/70 | HR 104 | Wt 160.0 lb

## 2013-11-26 DIAGNOSIS — O0990 Supervision of high risk pregnancy, unspecified, unspecified trimester: Secondary | ICD-10-CM

## 2013-11-26 DIAGNOSIS — O351XX0 Maternal care for (suspected) chromosomal abnormality in fetus, not applicable or unspecified: Secondary | ICD-10-CM | POA: Insufficient documentation

## 2013-11-26 DIAGNOSIS — O3510X0 Maternal care for (suspected) chromosomal abnormality in fetus, unspecified, not applicable or unspecified: Secondary | ICD-10-CM | POA: Insufficient documentation

## 2013-11-26 DIAGNOSIS — Z3682 Encounter for antenatal screening for nuchal translucency: Secondary | ICD-10-CM

## 2013-11-26 DIAGNOSIS — Z3689 Encounter for other specified antenatal screening: Secondary | ICD-10-CM | POA: Insufficient documentation

## 2013-11-26 NOTE — Progress Notes (Signed)
[facility] ultrasound  Indication: 33 yr old G3P0020 at 5622w0d for first trimester screen.  Findings: 1. Single intrauterine pregnancy. 2. Fetal crown rump length is consistent with dating. 3. Normal uterus; no adnexal masses seen. 4. Evaluation of fetal anatomy is limited by early gestational age. 5. Normal nuchal translucency measuring 1.218mm. 6. The nasal bone is visualized.  Recommendations: 1. Appropriate dating. 2. First trimester screen done today- discussed limitations of screening tests in detecting fetal aneuploidy. 3. Recommend maternal serum AFP at 15-20 weeks. 4. Recommend fetal anatomic survey at 18-20 weeks.  Eulis FosterKristen Marinell Igarashi, MD

## 2013-12-02 ENCOUNTER — Encounter (HOSPITAL_COMMUNITY): Payer: Self-pay | Admitting: Emergency Medicine

## 2013-12-02 ENCOUNTER — Telehealth (INDEPENDENT_AMBULATORY_CARE_PROVIDER_SITE_OTHER): Payer: Self-pay | Admitting: *Deleted

## 2013-12-02 ENCOUNTER — Emergency Department (HOSPITAL_COMMUNITY)
Admission: EM | Admit: 2013-12-02 | Discharge: 2013-12-03 | Disposition: A | Discharge status: Home / Self Care | Payer: Medicaid Other | Attending: Emergency Medicine | Admitting: Emergency Medicine

## 2013-12-02 DIAGNOSIS — O9989 Other specified diseases and conditions complicating pregnancy, childbirth and the puerperium: Secondary | ICD-10-CM | POA: Insufficient documentation

## 2013-12-02 DIAGNOSIS — Z8659 Personal history of other mental and behavioral disorders: Secondary | ICD-10-CM | POA: Insufficient documentation

## 2013-12-02 DIAGNOSIS — Z79899 Other long term (current) drug therapy: Secondary | ICD-10-CM | POA: Insufficient documentation

## 2013-12-02 DIAGNOSIS — Z8619 Personal history of other infectious and parasitic diseases: Secondary | ICD-10-CM | POA: Insufficient documentation

## 2013-12-02 DIAGNOSIS — O99611 Diseases of the digestive system complicating pregnancy, first trimester: Secondary | ICD-10-CM

## 2013-12-02 DIAGNOSIS — G8929 Other chronic pain: Secondary | ICD-10-CM | POA: Insufficient documentation

## 2013-12-02 DIAGNOSIS — R110 Nausea: Secondary | ICD-10-CM

## 2013-12-02 DIAGNOSIS — O99330 Smoking (tobacco) complicating pregnancy, unspecified trimester: Secondary | ICD-10-CM | POA: Insufficient documentation

## 2013-12-02 DIAGNOSIS — Z8711 Personal history of peptic ulcer disease: Secondary | ICD-10-CM | POA: Insufficient documentation

## 2013-12-02 DIAGNOSIS — O210 Mild hyperemesis gravidarum: Secondary | ICD-10-CM | POA: Insufficient documentation

## 2013-12-02 DIAGNOSIS — R109 Unspecified abdominal pain: Secondary | ICD-10-CM | POA: Insufficient documentation

## 2013-12-02 DIAGNOSIS — Z9089 Acquired absence of other organs: Secondary | ICD-10-CM | POA: Insufficient documentation

## 2013-12-02 DIAGNOSIS — K8020 Calculus of gallbladder without cholecystitis without obstruction: Secondary | ICD-10-CM

## 2013-12-02 LAB — URINALYSIS, ROUTINE W REFLEX MICROSCOPIC
BILIRUBIN URINE: NEGATIVE
Glucose, UA: NEGATIVE mg/dL
HGB URINE DIPSTICK: NEGATIVE
Ketones, ur: NEGATIVE mg/dL
Nitrite: NEGATIVE
PH: 6 (ref 5.0–8.0)
Protein, ur: NEGATIVE mg/dL
Specific Gravity, Urine: 1.025 (ref 1.005–1.030)
Urobilinogen, UA: 1 mg/dL (ref 0.0–1.0)

## 2013-12-02 LAB — COMPREHENSIVE METABOLIC PANEL
ALBUMIN: 3.8 g/dL (ref 3.5–5.2)
ALK PHOS: 98 U/L (ref 39–117)
ALT: 37 U/L — AB (ref 0–35)
AST: 13 U/L (ref 0–37)
BUN: 11 mg/dL (ref 6–23)
CHLORIDE: 99 meq/L (ref 96–112)
CO2: 24 mEq/L (ref 19–32)
Calcium: 10.5 mg/dL (ref 8.4–10.5)
Creatinine, Ser: 0.51 mg/dL (ref 0.50–1.10)
GFR calc non Af Amer: >90 mL/min (ref >90)
GLUCOSE: 95 mg/dL (ref 70–99)
POTASSIUM: 4.7 meq/L (ref 3.7–5.3)
SODIUM: 138 meq/L (ref 137–147)
TOTAL PROTEIN: 7.6 g/dL (ref 6.0–8.3)
Total Bilirubin: <0.2 mg/dL — ABNORMAL LOW (ref 0.3–1.2)

## 2013-12-02 LAB — CBC WITH DIFFERENTIAL/PLATELET
BASOS PCT: 0 % (ref 0–1)
Basophils Absolute: 0 10*3/uL (ref 0.0–0.1)
Eosinophils Absolute: 0.2 10*3/uL (ref 0.0–0.7)
Eosinophils Relative: 1 % (ref 0–5)
HCT: 38.4 % (ref 36.0–46.0)
Hemoglobin: 13 g/dL (ref 12.0–15.0)
Lymphocytes Relative: 23 % (ref 12–46)
Lymphs Abs: 3.7 10*3/uL (ref 0.7–4.0)
MCH: 29 pg (ref 26.0–34.0)
MCHC: 33.9 g/dL (ref 30.0–36.0)
MCV: 85.7 fL (ref 78.0–100.0)
Monocytes Absolute: 1.1 10*3/uL — ABNORMAL HIGH (ref 0.1–1.0)
Monocytes Relative: 7 % (ref 3–12)
NEUTROS PCT: 69 % (ref 43–77)
Neutro Abs: 11.2 10*3/uL — ABNORMAL HIGH (ref 1.7–7.7)
Platelets: 299 10*3/uL (ref 150–400)
RBC: 4.48 MIL/uL (ref 3.87–5.11)
RDW: 14.8 % (ref 11.5–15.5)
WBC: 16.2 10*3/uL — ABNORMAL HIGH (ref 4.0–10.5)

## 2013-12-02 LAB — URINE MICROSCOPIC-ADD ON

## 2013-12-02 LAB — LIPASE, BLOOD: Lipase: 51 U/L (ref 11–59)

## 2013-12-02 MED ORDER — OXYCODONE-ACETAMINOPHEN 5-325 MG PO TABS: 1.0000 | ORAL_TABLET | Freq: Four times a day (QID) | ORAL | Status: DC | PRN

## 2013-12-02 MED ORDER — OMEPRAZOLE 20 MG PO CPDR: 20.0000 mg | DELAYED_RELEASE_CAPSULE | Freq: Every day | ORAL | Status: DC

## 2013-12-02 MED ORDER — SUCRALFATE 1 G PO TABS: 1.0000 g | ORAL_TABLET | Freq: Three times a day (TID) | ORAL | Status: DC

## 2013-12-02 MED ORDER — ONDANSETRON HCL 4 MG/2ML IJ SOLN
4.0000 mg | Freq: Once | INTRAMUSCULAR | Status: AC
  Administered 2013-12-02: 4 mg via INTRAVENOUS
  Filled 2013-12-02: qty 2

## 2013-12-02 MED ORDER — LIDOCAINE VISCOUS 2 % MT SOLN
15.0000 mL | Freq: Once | OROMUCOSAL | Status: AC
  Administered 2013-12-03: 15 mL via OROMUCOSAL
  Filled 2013-12-02: qty 15

## 2013-12-02 MED ORDER — MORPHINE SULFATE 4 MG/ML IJ SOLN
4.0000 mg | Freq: Once | INTRAMUSCULAR | Status: AC
  Administered 2013-12-02: 4 mg via INTRAVENOUS
  Filled 2013-12-02: qty 1

## 2013-12-02 NOTE — Telephone Encounter (Signed)
Patient reports that since her surgery she has continued to have epigastric pain with nausea.  Patient states she has had vomiting on and off again however yesterday the vomiting has become more severe and is constant.  Patient reports that the epigastric pain is not relieved with vomiting and it just creates diffuse abdominal pain.  Patient states she has been taking Phenergan but that is not controlling the vomiting.  I suggested sending in Zofran however patient reports that she has seen that Zofran has been linked to birth defects so she is reluctant to try that.  Explained that I would send a message to Dr. Derrell Lollingamirez to make him aware of her symptoms then we will let her know his suggestions.  Patient states understanding and agreeable at this time.

## 2013-12-02 NOTE — Discharge Instructions (Signed)
Abdominal (belly) pain can be caused by many things. Your caregiver performed an examination and possibly ordered blood/urine tests and imaging (CT scan, x-rays, ultrasound). Many cases can be observed and treated at home after initial evaluation in the emergency department. Even though you are being discharged home, abdominal pain can be unpredictable. Therefore, you need a repeated exam if your pain does not resolve, returns, or worsens. Most patients with abdominal pain don't have to be admitted to the hospital or have surgery, but serious problems like appendicitis and gallbladder attacks can start out as nonspecific pain. Many abdominal conditions cannot be diagnosed in one visit, so follow-up evaluations are very important. SEEK IMMEDIATE MEDICAL ATTENTION IF: The pain does not go away or becomes severe.  A temperature above 101 develops.  Repeated vomiting occurs (multiple episodes).  The pain becomes localized to portions of the abdomen. The right side could possibly be appendicitis. In an adult, the left lower portion of the abdomen could be colitis or diverticulitis.  Blood is being passed in stools or vomit (bright red or black tarry stools).  Return also if you develop chest pain, difficulty breathing, dizziness or fainting, or become confused, poorly responsive, or inconsolable (young children).  Abdominal Pain During Pregnancy Abdominal pain is common in pregnancy. Most of the time, it does not cause harm. There are many causes of abdominal pain. Some causes are more serious than others. Some of the causes of abdominal pain in pregnancy are easily diagnosed. Occasionally, the diagnosis takes time to understand. Other times, the cause is not determined. Abdominal pain can be a sign that something is very wrong with the pregnancy, or the pain may have nothing to do with the pregnancy at all. For this reason, always tell your health care provider if you have any abdominal discomfort. HOME CARE  INSTRUCTIONS  Monitor your abdominal pain for any changes. The following actions may help to alleviate any discomfort you are experiencing:  Do not have sexual intercourse or put anything in your vagina until your symptoms go away completely.  Get plenty of rest until your pain improves.  Drink clear fluids if you feel nauseous. Avoid solid food as long as you are uncomfortable or nauseous.  Only take over-the-counter or prescription medicine as directed by your health care provider.  Keep all follow-up appointments with your health care provider. SEEK IMMEDIATE MEDICAL CARE IF:  You are bleeding, leaking fluid, or passing tissue from the vagina.  You have increasing pain or cramping.  You have persistent vomiting.  You have painful or bloody urination.  You have a fever.  You notice a decrease in your baby's movements.  You have extreme weakness or feel faint.  You have shortness of breath, with or without abdominal pain.  You develop a severe headache with abdominal pain.  You have abnormal vaginal discharge with abdominal pain.  You have persistent diarrhea.  You have abdominal pain that continues even after rest, or gets worse. MAKE SURE YOU:   Understand these instructions.  Will watch your condition.  Will get help right away if you are not doing well or get worse. Document Released: 08/29/2005 Document Revised: 06/19/2013 Document Reviewed: 03/28/2013 [facility] Patient Information 2014 [place].  Nausea and Vomiting Nausea is a sick feeling that often comes before throwing up (vomiting). Vomiting is a reflex where stomach contents come out of your mouth. Vomiting can cause severe loss of body fluids (dehydration). Children and elderly adults can become dehydrated quickly, especially if they  also have diarrhea. Nausea and vomiting are symptoms of a condition or disease. It is important to find the cause of your symptoms. CAUSES   Direct irritation of the  stomach lining. This irritation can result from increased acid production (gastroesophageal reflux disease), infection, food poisoning, taking certain medicines (such as nonsteroidal anti-inflammatory drugs), alcohol use, or tobacco use.  Signals from the brain.These signals could be caused by a headache, heat exposure, an inner ear disturbance, increased pressure in the brain from injury, infection, a tumor, or a concussion, pain, emotional stimulus, or metabolic problems.  An obstruction in the gastrointestinal tract (bowel obstruction).  Illnesses such as diabetes, hepatitis, gallbladder problems, appendicitis, kidney problems, cancer, sepsis, atypical symptoms of a heart attack, or eating disorders.  Medical treatments such as chemotherapy and radiation.  Receiving medicine that makes you sleep (general anesthetic) during surgery. DIAGNOSIS Your caregiver may ask for tests to be done if the problems do not improve after a few days. Tests may also be done if symptoms are severe or if the reason for the nausea and vomiting is not clear. Tests may include:  Urine tests.  Blood tests.  Stool tests.  Cultures (to look for evidence of infection).  X-rays or other imaging studies. Test results can help your caregiver make decisions about treatment or the need for additional tests. TREATMENT You need to stay well hydrated. Drink frequently but in small amounts.You may wish to drink water, sports drinks, clear broth, or eat frozen ice pops or gelatin dessert to help stay hydrated.When you eat, eating slowly may help prevent nausea.There are also some antinausea medicines that may help prevent nausea. HOME CARE INSTRUCTIONS   Take all medicine as directed by your caregiver.  If you do not have an appetite, do not force yourself to eat. However, you must continue to drink fluids.  If you have an appetite, eat a normal diet unless your caregiver tells you differently.  Eat a variety of  complex carbohydrates (rice, wheat, potatoes, bread), lean meats, yogurt, fruits, and vegetables.  Avoid high-fat foods because they are more difficult to digest.  Drink enough water and fluids to keep your urine clear or pale yellow.  If you are dehydrated, ask your caregiver for specific rehydration instructions. Signs of dehydration may include:  Severe thirst.  Dry lips and mouth.  Dizziness.  Dark urine.  Decreasing urine frequency and amount.  Confusion.  Rapid breathing or pulse. SEEK IMMEDIATE MEDICAL CARE IF:   You have blood or brown flecks (like coffee grounds) in your vomit.  You have black or bloody stools.  You have a severe headache or stiff neck.  You are confused.  You have severe abdominal pain.  You have chest pain or trouble breathing.  You do not urinate at least once every 8 hours.  You develop cold or clammy skin.  You continue to vomit for longer than 24 to 48 hours.  You have a fever. MAKE SURE YOU:   Understand these instructions.  Will watch your condition.  Will get help right away if you are not doing well or get worse. Document Released: 08/29/2005 Document Revised: 11/21/2011 Document Reviewed: 01/26/2011 [facility] Patient Information 2014 [place], [place].

## 2013-12-02 NOTE — Telephone Encounter (Signed)
Zofran would be fine she can check with her OB MD and ask.  Its a medication they give pregnany patients all the time.  Nausea could also be related her pregnancy.

## 2013-12-02 NOTE — ED Notes (Signed)
Pt states that she is [redacted] weeks pregnant.

## 2013-12-02 NOTE — ED Provider Notes (Signed)
CSN: [id number]     Arrival date & time 12/02/13  1753 History   First MD Initiated Contact with Patient 12/02/13 2023     Chief Complaint  Patient presents with  . Abdominal Pain  . Emesis  . Hematemesis     (Consider location/radiation/quality/duration/timing/severity/associated sxs/prior Treatment) HPI [name] [name] 33 y/o F with hx of narcotic abuse, anxiety, depression, hx PUD and H.Pylori. She is  Currently [redacted] weeks pregnant and is s/p Cholecystectomy on 11/20/2013. The patient present today stating tha tshe continues to have sever pain despite taking tylenol, NSAIDS, and percocet. Patient states that she feels she has not gotten any better. The patient presents c/o sever pain in her RUQ and epigastrium. She also states that she has had pain in her trochar sites when she vomits or coughs. Patient states that she has had vomiting daily which is worse in the morning. She has been afraid to take zofran because she read on the internet that it could cause birth defects. She states that today after heavy vomiting this morning she noiced blood tinged vomitus. She has been able to hold down foods and fluids Denies fevers, chills, myalgias, arthralgias. Denies DOE, SOB, chest tightness or pressure, radiation to left arm, jaw or back, or diaphoresis. Denies dysuria, flank pain, suprapubic pain, frequency, urgency, or hematuria. Denies headaches, light headedness, weakness, visual disturbances.   Past Medical History  Diagnosis Date  . Narcotic abuse   . Anxiety   . Depression   . Chronic headaches   . H. pylori infection    Past Surgical History  Procedure Laterality Date  . Abortions    . Wisdom tooth extraction    . Cholecystectomy N/A 11/21/2013    Procedure: LAPAROSCOPIC CHOLECYSTECTOMY ;  Surgeon: Axel FillerArmando Ramirez, MD;  Location: [facility];  Service: General;  Laterality: N/A;   Family History  Problem Relation Age of Onset  . Diabetes Mother   . Cancer Maternal Aunt   . Cancer  Maternal Uncle   . Cancer Paternal Aunt   . Cancer Paternal Uncle    History  Substance Use Topics  . Smoking status: Current Every Day Smoker -- 1.00 packs/day for 15 years    Types: Cigarettes  . Smokeless tobacco: Never Used  . Alcohol Use: No     Comment: recovering addict   OB History   Grav Para Term Preterm Abortions TAB SAB Ect Mult Living   3 0   2 2    0     Review of Systems  Ten systems reviewed and are negative for acute change, except as noted in the HPI.    Allergies  Review of patient's allergies indicates no known allergies.  Home Medications   Current Outpatient Rx  Name  Route  Sig  Dispense  Refill  . calcium carbonate (TUMS - DOSED IN MG ELEMENTAL CALCIUM) 500 MG chewable tablet   Oral   Chew 6-8 tablets by mouth as needed for indigestion or heartburn.          Marland Kitchen. ibuprofen (ADVIL) 200 MG tablet      You can take 2-3 tablets every 6 hours as needed for pain.   30 tablet   0   . omeprazole (PRILOSEC) 40 MG capsule   Oral   Take 40 mg by mouth daily.         Marland Kitchen. oxyCODONE-acetaminophen (ROXICET) 5-325 MG per tablet   Oral   Take 1-2 tablets by mouth every 6 (six) hours as needed  for severe pain.   40 tablet   0   . Prenatal Vit-Fe Fumarate-FA (PRENATAL MULTIVITAMIN) TABS tablet   Oral   Take 1 tablet by mouth daily at 12 noon.         . promethazine (PHENERGAN) 12.5 MG tablet   Oral   Take 1 tablet (12.5 mg total) by mouth every 6 (six) hours as needed for nausea or vomiting.   30 tablet   3    BP 109/69  Pulse 115  Temp(Src) 98.6 F (37 C) (Oral)  Resp 18  SpO2 96%  LMP 08/27/2013 Physical Exam  Vitals reviewed. Constitutional: She is oriented to person, place, and time. She appears well-developed and well-nourished. No distress.  tearful  HENT:  Head: Normocephalic and atraumatic.  Eyes: Conjunctivae are normal. No scleral icterus.  Neck: Normal range of motion.  Cardiovascular: Normal rate, regular rhythm and normal  heart sounds.  Exam reveals no gallop and no friction rub.   No murmur heard. Pulmonary/Chest: Effort normal and breath sounds normal. No respiratory distress.  Abdominal: Soft. Bowel sounds are normal. She exhibits no distension and no mass. There is tenderness. There is no guarding.  Soft abdomen. Tender to palpation throughout abdomen , worse in RUQ and epigastrium. Well healing trocar sights without evidence of infection.  Musculoskeletal: Normal range of motion.  Neurological: She is alert and oriented to person, place, and time.  Skin: Skin is warm and dry. She is not diaphoretic.    ED Course  Procedures (including critical care time) Labs Review Labs Reviewed  CBC WITH DIFFERENTIAL - Abnormal; Notable for the following:    WBC 16.2 (*)    Neutro Abs 11.2 (*)    Monocytes Absolute 1.1 (*)    All other components within normal limits  COMPREHENSIVE METABOLIC PANEL - Abnormal; Notable for the following:    ALT 37 (*)    Total Bilirubin <0.2 (*)    All other components within normal limits  URINALYSIS, ROUTINE W REFLEX MICROSCOPIC - Abnormal; Notable for the following:    APPearance CLOUDY (*)    Leukocytes, UA SMALL (*)    All other components within normal limits  URINE MICROSCOPIC-ADD ON - Abnormal; Notable for the following:    Squamous Epithelial / LPF MANY (*)    All other components within normal limits  LIPASE, BLOOD   Imaging Review No results found.   EKG Interpretation None      MDM   Final diagnoses:  Abdominal pain  Nausea   Filed Vitals:   12/02/13 2337  BP: 108/50  Pulse: 82  Temp: 97.8 F (36.6 C)  Resp: 18    Patient UA questionable for infection vs. Contamination. I have sent the Urine for contamination. The patient labs show a slight elevation in  White count, however she is afebrial, with stable vitals. Patient has been observed for several hours with no repeat vomiting. Pain controlled here in the ED I have reviewed the case with  Dr.  Bernette Mayers and i believer that her pain is either due to the surgery itself or from her PUD. I have discussed this with the patient and feel she should follow up with her OB as well as GI specialist with whom she consulted just before surgery. Patient expresses understanding and agrees wih POC.     Arthor Captain, PA-C 12/04/13 2209

## 2013-12-02 NOTE — ED Notes (Signed)
Patient states she has increased pain after receiving morphine. She describes it as a wave of cramps in her abdomen.

## 2013-12-02 NOTE — Progress Notes (Signed)
   CARE MANAGEMENT ED NOTE 12/02/2013  Patient:  [name],[name]   Account Number:  [id number]  Date Initiated:  12/02/2013  Documentation initiated by:  Radford PaxFERRERO,[name] Hackenberg  Subjective/Objective Assessment:   Patient presents to Ed with epigastric pain with nausea.     Subjective/Objective Assessment Detail:   patient post gallbladder removal 03/12.  Patient reports she is [redacted] weeks pregnant     Action/Plan:   Action/Plan Detail:   Anticipated DC Date:       Status Recommendation to Physician:   Result of Recommendation:    Other ED Services  Consult Working Plan    DC Planning Services  Other  PCP issues    Choice offered to / List presented to:            Status of service:  Completed, signed off  ED Comments:   ED Comments Detail:  Patient [facility]  System updated.

## 2013-12-02 NOTE — ED Notes (Signed)
Pt states that she had her gallbladder removed on 3/12. Pt started having abd pain on Saturday that has progressed with intensity. Pt states that she has been vomiting since yesterday. Today when she vomited the first time she it was dark red and the 2nd time it was more pink-red. Pt notified surgeons office and was told to switch phenergan to zofran and to contact her OB office. She contacted her OB office and they couldn't help her. Pt states she burns at incision sites when she vomits.

## 2013-12-02 NOTE — Telephone Encounter (Signed)
Patient is going to call her OB MD at this time to run everything past them.  I explained that since patient is pregnant they really would be the ones to determine what she should and should not be taking.  Patient states understanding and agreeable at this time.

## 2013-12-03 ENCOUNTER — Telehealth: Payer: Self-pay | Admitting: Internal Medicine

## 2013-12-03 ENCOUNTER — Telehealth: Payer: Self-pay | Admitting: *Deleted

## 2013-12-03 NOTE — Telephone Encounter (Signed)
Patient called nurse line needing advice for pain in upper abdominal area, Patient had gallbladder removed recently.  Attempted to call patient and left message that she could return call if needed.

## 2013-12-03 NOTE — Telephone Encounter (Addendum)
Pt returned call @ 1035 and stated that she just missed our call. She was @ ED last night due to pain and her WBC was elevated.  Please call back. I called pt and informed her that I am waiting to be able to speak with the doctor on call regarding her concern. I also advised her that I had read the notes from the ED visit yesterday and from her phone call to the GI doctor's office. I acknowledged that Dr. Marvell FullerGessner's office advised her to be seen this week by her Ob provider first, then they will see her if needed after Ob evaluation. She stated that she was given percocet for her pain and it seems to be helping. We discussed that she should take the least amount needed in order to manage her pain and not to expect to necessarily be completely pain free but it should be tolerable. She was concerned about the elevated white count and asked if the urine culture result is available. I advised her that the test is not yet completed and this is expected- it may take 24-48 hrs for completion. She stated that she was advised at the ED to stop taking phenergan for her nausea because it was not working and to take zofran instead. She has been afraid to take the zofran because of potential problems to the unborn baby. I assured pt that she may take zofran now because she is [redacted]wks EGA. I stated that I will call her back later today after consulting with the doctor on call.  She voiced understanding.  1620  - Called pt after consult with Dr. Shawnie PonsPratt.  I informed her that we can see her in the office tomorrow @ 1500.  Pt agreed and was very thankful for the appt because she has been worried about her health and that of her baby.

## 2013-12-03 NOTE — Telephone Encounter (Signed)
Patient was seen in the ER last night for nausea, vomiting and abdominal pain. She is [redacted] weeks pregnant and is 2 weeks s/p cholecystectomy.  Pain is epigastric and periumbilical.   ER note is incomplete.  Her WBC last night was 16.2 and UA was abnormal , culture pending.  They sent her home with zofran and told her to follow up with GI and GYN.  Chart reviewed with Willette ClusterPaula Guenther RNP.  Patient to return to her GYN first if they feel pain is GI in nature we will have her see APP this week.  She verbalized understanding to see her GYN asap

## 2013-12-03 NOTE — Telephone Encounter (Signed)
OK 

## 2013-12-04 ENCOUNTER — Other Ambulatory Visit: Payer: Self-pay | Admitting: *Deleted

## 2013-12-04 ENCOUNTER — Encounter: Payer: Self-pay | Admitting: Obstetrics & Gynecology

## 2013-12-04 ENCOUNTER — Ambulatory Visit (INDEPENDENT_AMBULATORY_CARE_PROVIDER_SITE_OTHER): Payer: Medicaid Other | Admitting: Obstetrics & Gynecology

## 2013-12-04 VITALS — BP 111/73 | Temp 98.5°F | Wt 158.8 lb

## 2013-12-04 DIAGNOSIS — R1013 Epigastric pain: Secondary | ICD-10-CM

## 2013-12-04 DIAGNOSIS — K8000 Calculus of gallbladder with acute cholecystitis without obstruction: Secondary | ICD-10-CM

## 2013-12-04 DIAGNOSIS — Z3480 Encounter for supervision of other normal pregnancy, unspecified trimester: Secondary | ICD-10-CM

## 2013-12-04 LAB — POCT URINALYSIS DIP (DEVICE)
Bilirubin Urine: NEGATIVE
Glucose, UA: NEGATIVE mg/dL
Hgb urine dipstick: NEGATIVE
Ketones, ur: NEGATIVE mg/dL
Leukocytes, UA: NEGATIVE
Nitrite: NEGATIVE
Protein, ur: NEGATIVE mg/dL
Specific Gravity, Urine: 1.01 (ref 1.005–1.030)
Urobilinogen, UA: 0.2 mg/dL (ref 0.0–1.0)
pH: 6 (ref 5.0–8.0)

## 2013-12-04 LAB — URINE CULTURE
Colony Count: NO GROWTH
Culture: NO GROWTH

## 2013-12-04 NOTE — Progress Notes (Signed)
P= 103 C/o of pain from gallbladder surgery on the 12th; pt. Has contacted surgeon who informed pt. To go to ED if pain got worse and to take tylenol; pt states tylenol does not help. Was in [facility] two nights ago, was given morphine for the pain. Percocet prescribed for pain.   Appointment made for [facility] for this Friday 3/27 at 1430.

## 2013-12-04 NOTE — ED Provider Notes (Signed)
Medical screening examination/treatment/procedure(s) were performed by non-physician practitioner and as supervising physician I was immediately available for consultation/collaboration.   EKG Interpretation None        Charles B. Bernette MayersSheldon, MD 12/04/13 2321

## 2013-12-04 NOTE — Progress Notes (Signed)
Work in visit because of epigastric pain. This is the same pain that she had prior to the removal of her gallbladder on 11-21-13. [facility] and was given percocet prescription. She has a follow up gen surg appt 12-09-13. She denies fevers. MSAFP at next visit. I did tell her that she can use IBU until the third trimester as she is concerned about narcotic use. I made a referral to the GI for her epigastric pain. Her First screen had not been scanned in from the 17th, but I called [facility] and they said that it was normal.

## 2013-12-05 ENCOUNTER — Ambulatory Visit (HOSPITAL_COMMUNITY): Admission: RE | Admit: 2013-12-05 | Payer: Medicaid Other | Source: Ambulatory Visit | Admitting: Surgery

## 2013-12-05 ENCOUNTER — Encounter: Payer: Medicaid Other | Admitting: Obstetrics & Gynecology

## 2013-12-05 SURGERY — LAPAROSCOPIC CHOLECYSTECTOMY
Anesthesia: General

## 2013-12-06 ENCOUNTER — Ambulatory Visit (INDEPENDENT_AMBULATORY_CARE_PROVIDER_SITE_OTHER): Payer: Medicaid Other | Admitting: Gastroenterology

## 2013-12-06 ENCOUNTER — Other Ambulatory Visit (INDEPENDENT_AMBULATORY_CARE_PROVIDER_SITE_OTHER): Payer: Medicaid Other

## 2013-12-06 ENCOUNTER — Encounter: Payer: Self-pay | Admitting: Gastroenterology

## 2013-12-06 VITALS — BP 100/66 | HR 80 | Ht 66.0 in | Wt 160.4 lb

## 2013-12-06 DIAGNOSIS — R1013 Epigastric pain: Secondary | ICD-10-CM

## 2013-12-06 DIAGNOSIS — R112 Nausea with vomiting, unspecified: Secondary | ICD-10-CM

## 2013-12-06 LAB — COMPREHENSIVE METABOLIC PANEL
ALT: 53 U/L — AB (ref 0–35)
AST: 25 U/L (ref 0–37)
Albumin: 3.5 g/dL (ref 3.5–5.2)
Alkaline Phosphatase: 98 U/L (ref 39–117)
BUN: 7 mg/dL (ref 6–23)
CALCIUM: 9.8 mg/dL (ref 8.4–10.5)
CHLORIDE: 104 meq/L (ref 96–112)
CO2: 26 mEq/L (ref 19–32)
CREATININE: 0.6 mg/dL (ref 0.4–1.2)
GFR: 131.82 mL/min (ref >60.00)
Glucose, Bld: 82 mg/dL (ref 70–99)
Potassium: 3.7 mEq/L (ref 3.5–5.1)
Sodium: 137 mEq/L (ref 135–145)
Total Bilirubin: 0.1 mg/dL — ABNORMAL LOW (ref 0.3–1.2)
Total Protein: 6.5 g/dL (ref 6.0–8.3)

## 2013-12-06 LAB — CBC WITH DIFFERENTIAL/PLATELET
Basophils Absolute: 0.2 10*3/uL — ABNORMAL HIGH (ref 0.0–0.1)
Basophils Relative: 1.1 % (ref 0.0–3.0)
EOS ABS: 0.1 10*3/uL (ref 0.0–0.7)
Eosinophils Relative: 1 % (ref 0.0–5.0)
HCT: 36 % (ref 36.0–46.0)
Hemoglobin: 12 g/dL (ref 12.0–15.0)
LYMPHS PCT: 29.7 % (ref 12.0–46.0)
Lymphs Abs: 4.2 10*3/uL — ABNORMAL HIGH (ref 0.7–4.0)
MCHC: 33.3 g/dL (ref 30.0–36.0)
MCV: 87 fl (ref 78.0–100.0)
Monocytes Absolute: 0.7 10*3/uL (ref 0.1–1.0)
Monocytes Relative: 5.1 % (ref 3.0–12.0)
NEUTROS ABS: 9 10*3/uL — AB (ref 1.4–7.7)
NEUTROS PCT: 63.1 % (ref 43.0–77.0)
Platelets: 321 10*3/uL (ref 150.0–400.0)
RBC: 4.14 Mil/uL (ref 3.87–5.11)
RDW: 15.2 % — AB (ref 11.5–14.6)
WBC: 14.3 10*3/uL — ABNORMAL HIGH (ref 4.5–10.5)

## 2013-12-06 MED ORDER — OMEPRAZOLE 40 MG PO CPDR: 40.0000 mg | DELAYED_RELEASE_CAPSULE | Freq: Two times a day (BID) | ORAL | Status: DC

## 2013-12-06 NOTE — Progress Notes (Signed)
12/06/2013 [name] PaulsJessica [name] [id number] 19-Jul-1981   History of Present Illness:  This is a 33 year old female with history of narcotic abuse, chronic headaches, and anxiety and depression.  She presents to our office today with complaints of epigastric abdominal pain with nausea and vomiting.  These symptoms have been present since November.  She was found to have a positive Hpylori serology and was treated for that, but there was no resolution of her symptoms.  She is pregnant and is [redacted] weeks along.  She was found to have gallstones and was scheduled to have her gallbladder removed in her second trimester, but she ended up back in the ED on March 11 and ultrasound suggested acute cholecystitis.  She underwent cholecystectomy on 3/12.  She says that since her gallbladder was removed there has also been no improvement in her symptoms.  Says that she vomits all the time.  She says that the pain is severe and that she can take it anymore.  She is on omeprazole 40 mg daily.  Has carafate as well but she does not take it four times a day because it says with meals and she does not eat much.  Was in the ED again this week with leukocytosis of 16,000.  Lipase and CMP were normal.   Current Medications, Allergies, Past Medical History, Past Surgical History, Family History and Social History were reviewed [facility] electronic [facility].   Physical Exam: BP 100/66  Pulse 80  Ht 5\' 6"  (1.676 m)  Wt 160 lb 6.4 oz (72.757 kg)  BMI 25.90 kg/m2  LMP 08/27/2013 General: Well developed white female in no acute distress; tearful Head: Normocephalic and atraumatic Eyes:  Sclerae anicteric, conjunctiva pink  Ears: Normal auditory acuity Lungs: Clear throughout to auscultation Heart: Regular rate and rhythm Abdomen: Soft, non-distended.  Normal bowel sounds.  Epigastric TTP without R/R/G. Musculoskeletal: Symmetrical with no gross deformities  Extremities: No edema  Neurological: Alert oriented x 4,  grossly non-focal Psychological:  Alert and cooperative. Normal mood and affect  Assessment and Recommendations: -Epigastric abdominal pain with nausea and vomiting x 4 months:  Unsure of the cause of her pain.  Had cholecystectomy earlier this month without resolution of her pain.  Will recheck abdominal ultrasound to evaluate bile ducts.  Check CBC and CMP.  She will increase her omeprazole to 40 mg BID and take her carafate four times daily as it was prescribed.  Follow-up in 4 weeks with Dr. Leone PayorGessner.

## 2013-12-06 NOTE — Patient Instructions (Addendum)
Please make a follow up appointment with Dr. Leone PayorGessner for first available.   Please increase your Omeprazole to twice daily.  Please continue your Carafate   Your physician has requested that you go to the basement for the following lab work before leaving today: CBC CMP  [facility] on 12-11-13 at 800 am. Please arrive 15 minutes prior to your appointment for registration. Make certain not to have anything to eat or drink 6 hours prior to your appointment. Should you need to reschedule your appointment, please contact radiology at [phone]. This test typically takes about 30 minutes to perform.

## 2013-12-06 NOTE — Progress Notes (Signed)
Agree with Ms. [name]'s plan  Await [place]

## 2013-12-09 ENCOUNTER — Encounter (INDEPENDENT_AMBULATORY_CARE_PROVIDER_SITE_OTHER): Payer: Self-pay | Admitting: General Surgery

## 2013-12-09 ENCOUNTER — Ambulatory Visit (INDEPENDENT_AMBULATORY_CARE_PROVIDER_SITE_OTHER): Payer: Medicaid Other | Admitting: General Surgery

## 2013-12-09 ENCOUNTER — Encounter: Payer: Self-pay | Admitting: *Deleted

## 2013-12-09 VITALS — BP 110/60 | HR 86 | Temp 98.2°F | Resp 16 | Wt 159.6 lb

## 2013-12-09 DIAGNOSIS — Z9049 Acquired absence of other specified parts of digestive tract: Secondary | ICD-10-CM

## 2013-12-09 DIAGNOSIS — Z9889 Other specified postprocedural states: Secondary | ICD-10-CM

## 2013-12-09 NOTE — Progress Notes (Signed)
Patient ID: [name], female   DOB: 04/06/1981, 33 y.o.   MRN: [id number] Post op course 33 year old female status post laparoscopic cholecystectomy.  Patient has continued to have epigastric pain. Patient is returned after surgery secondary this the past and epigastric pain. Patient seen Dr. Leone PayorGessner recently has had an ultrasound and biopsy ordered.  Her alkaline phosphatase and bili are within normal limits.  On Exam: Wounds are clean dry and intact  Pathology:   Chronic cholecystitis and cholelithiasis.  This was discussed with the patient.  Assessment and Plan 33 year old status post laparoscopic cholecystectomy 1. We discussed weightlifting restrictions 2. Patient to follow up as needed   Axel FillerArmando Aneli Zara, MD [facility] General [facility] Surgery Trauma & Emergency Surgery

## 2013-12-10 ENCOUNTER — Observation Stay (HOSPITAL_COMMUNITY): Payer: Medicaid Other

## 2013-12-10 ENCOUNTER — Inpatient Hospital Stay (HOSPITAL_COMMUNITY): Payer: Medicaid Other

## 2013-12-10 ENCOUNTER — Inpatient Hospital Stay (HOSPITAL_COMMUNITY)
Admission: AD | Admit: 2013-12-10 | Discharge: 2013-12-13 | DRG: 781 | Disposition: A | Discharge status: Home / Self Care | Payer: Medicaid Other | Source: Ambulatory Visit | Attending: Internal Medicine | Admitting: Internal Medicine

## 2013-12-10 ENCOUNTER — Encounter (HOSPITAL_COMMUNITY): Payer: Self-pay | Admitting: *Deleted

## 2013-12-10 DIAGNOSIS — F411 Generalized anxiety disorder: Secondary | ICD-10-CM | POA: Diagnosis present

## 2013-12-10 DIAGNOSIS — D72829 Elevated white blood cell count, unspecified: Secondary | ICD-10-CM | POA: Diagnosis present

## 2013-12-10 DIAGNOSIS — F3289 Other specified depressive episodes: Secondary | ICD-10-CM | POA: Diagnosis present

## 2013-12-10 DIAGNOSIS — O219 Vomiting of pregnancy, unspecified: Secondary | ICD-10-CM | POA: Diagnosis present

## 2013-12-10 DIAGNOSIS — K8020 Calculus of gallbladder without cholecystitis without obstruction: Secondary | ICD-10-CM

## 2013-12-10 DIAGNOSIS — K279 Peptic ulcer, site unspecified, unspecified as acute or chronic, without hemorrhage or perforation: Secondary | ICD-10-CM | POA: Diagnosis present

## 2013-12-10 DIAGNOSIS — Z3490 Encounter for supervision of normal pregnancy, unspecified, unspecified trimester: Secondary | ICD-10-CM

## 2013-12-10 DIAGNOSIS — F329 Major depressive disorder, single episode, unspecified: Secondary | ICD-10-CM

## 2013-12-10 DIAGNOSIS — R112 Nausea with vomiting, unspecified: Secondary | ICD-10-CM | POA: Diagnosis present

## 2013-12-10 DIAGNOSIS — Z8711 Personal history of peptic ulcer disease: Secondary | ICD-10-CM

## 2013-12-10 DIAGNOSIS — O99891 Other specified diseases and conditions complicating pregnancy: Principal | ICD-10-CM | POA: Diagnosis present

## 2013-12-10 DIAGNOSIS — K2990 Gastroduodenitis, unspecified, without bleeding: Secondary | ICD-10-CM

## 2013-12-10 DIAGNOSIS — O99611 Diseases of the digestive system complicating pregnancy, first trimester: Secondary | ICD-10-CM

## 2013-12-10 DIAGNOSIS — F419 Anxiety disorder, unspecified: Secondary | ICD-10-CM | POA: Diagnosis present

## 2013-12-10 DIAGNOSIS — Z79899 Other long term (current) drug therapy: Secondary | ICD-10-CM

## 2013-12-10 DIAGNOSIS — O0990 Supervision of high risk pregnancy, unspecified, unspecified trimester: Secondary | ICD-10-CM

## 2013-12-10 DIAGNOSIS — Z720 Tobacco use: Secondary | ICD-10-CM

## 2013-12-10 DIAGNOSIS — R109 Unspecified abdominal pain: Secondary | ICD-10-CM

## 2013-12-10 DIAGNOSIS — R1013 Epigastric pain: Secondary | ICD-10-CM | POA: Diagnosis present

## 2013-12-10 DIAGNOSIS — O9989 Other specified diseases and conditions complicating pregnancy, childbirth and the puerperium: Principal | ICD-10-CM

## 2013-12-10 DIAGNOSIS — F17200 Nicotine dependence, unspecified, uncomplicated: Secondary | ICD-10-CM

## 2013-12-10 DIAGNOSIS — K8012 Calculus of gallbladder with acute and chronic cholecystitis without obstruction: Secondary | ICD-10-CM

## 2013-12-10 DIAGNOSIS — K8010 Calculus of gallbladder with chronic cholecystitis without obstruction: Secondary | ICD-10-CM | POA: Diagnosis present

## 2013-12-10 DIAGNOSIS — O99330 Smoking (tobacco) complicating pregnancy, unspecified trimester: Secondary | ICD-10-CM | POA: Diagnosis present

## 2013-12-10 DIAGNOSIS — K2970 Gastritis, unspecified, without bleeding: Secondary | ICD-10-CM

## 2013-12-10 DIAGNOSIS — F32A Depression, unspecified: Secondary | ICD-10-CM | POA: Diagnosis present

## 2013-12-10 HISTORY — DX: Urinary tract infection, site not specified: N39.0

## 2013-12-10 LAB — COMPREHENSIVE METABOLIC PANEL
ALT: 28 U/L (ref 0–35)
AST: 18 U/L (ref 0–37)
Albumin: 3.5 g/dL (ref 3.5–5.2)
Alkaline Phosphatase: 102 U/L (ref 39–117)
BUN: 7 mg/dL (ref 6–23)
CALCIUM: 9.7 mg/dL (ref 8.4–10.5)
CO2: 24 mEq/L (ref 19–32)
Chloride: 98 mEq/L (ref 96–112)
Creatinine, Ser: 0.49 mg/dL — ABNORMAL LOW (ref 0.50–1.10)
GFR calc non Af Amer: >90 mL/min (ref >90)
GLUCOSE: 89 mg/dL (ref 70–99)
Potassium: 4.1 mEq/L (ref 3.7–5.3)
SODIUM: 135 meq/L — AB (ref 137–147)
Total Bilirubin: 0.3 mg/dL (ref 0.3–1.2)
Total Protein: 6.9 g/dL (ref 6.0–8.3)

## 2013-12-10 LAB — URINALYSIS, ROUTINE W REFLEX MICROSCOPIC
Bilirubin Urine: NEGATIVE
Glucose, UA: NEGATIVE mg/dL
Ketones, ur: NEGATIVE mg/dL
NITRITE: NEGATIVE
PH: 6.5 (ref 5.0–8.0)
Protein, ur: NEGATIVE mg/dL
SPECIFIC GRAVITY, URINE: 1.02 (ref 1.005–1.030)
UROBILINOGEN UA: 0.2 mg/dL (ref 0.0–1.0)

## 2013-12-10 LAB — URINE MICROSCOPIC-ADD ON

## 2013-12-10 LAB — CBC
HEMATOCRIT: 38.9 % (ref 36.0–46.0)
HEMOGLOBIN: 13.4 g/dL (ref 12.0–15.0)
MCH: 29.4 pg (ref 26.0–34.0)
MCHC: 34.4 g/dL (ref 30.0–36.0)
MCV: 85.3 fL (ref 78.0–100.0)
Platelets: 319 10*3/uL (ref 150–400)
RBC: 4.56 MIL/uL (ref 3.87–5.11)
RDW: 14.6 % (ref 11.5–15.5)
WBC: 13.6 10*3/uL — ABNORMAL HIGH (ref 4.0–10.5)

## 2013-12-10 LAB — LIPASE, BLOOD: LIPASE: 27 U/L (ref 11–59)

## 2013-12-10 LAB — RAPID URINE DRUG SCREEN, HOSP PERFORMED
Amphetamines: NOT DETECTED
BENZODIAZEPINES: NOT DETECTED
Barbiturates: NOT DETECTED
COCAINE: NOT DETECTED
Opiates: NOT DETECTED
Tetrahydrocannabinol: NOT DETECTED

## 2013-12-10 MED ORDER — SUCRALFATE 1 G PO TABS
1.0000 g | ORAL_TABLET | Freq: Three times a day (TID) | ORAL | Status: DC
  Administered 2013-12-10 – 2013-12-13 (×11): 1 g via ORAL
  Filled 2013-12-10 (×14): qty 1

## 2013-12-10 MED ORDER — HYDROMORPHONE HCL PF 1 MG/ML IJ SOLN
1.0000 mg | INTRAMUSCULAR | Status: AC | PRN
  Administered 2013-12-10 – 2013-12-11 (×3): 1 mg via INTRAVENOUS
  Filled 2013-12-10 (×4): qty 1

## 2013-12-10 MED ORDER — SODIUM CHLORIDE 0.9 % IV SOLN
INTRAVENOUS | Status: DC
  Administered 2013-12-10 – 2013-12-13 (×7): via INTRAVENOUS

## 2013-12-10 MED ORDER — FAMOTIDINE IN NACL 20-0.9 MG/50ML-% IV SOLN
20.0000 mg | Freq: Two times a day (BID) | INTRAVENOUS | Status: DC
Start: 2013-12-10 — End: 2013-12-13
  Administered 2013-12-10 – 2013-12-13 (×6): 20 mg via INTRAVENOUS
  Filled 2013-12-10 (×8): qty 50

## 2013-12-10 MED ORDER — ACETAMINOPHEN 325 MG PO TABS
650.0000 mg | ORAL_TABLET | Freq: Every day | ORAL | Status: DC | PRN
  Administered 2013-12-10: 650 mg via ORAL
  Filled 2013-12-10: qty 2

## 2013-12-10 MED ORDER — HYDROMORPHONE HCL PF 1 MG/ML IJ SOLN
2.0000 mg | Freq: Once | INTRAMUSCULAR | Status: AC
  Administered 2013-12-10: 2 mg via INTRAVENOUS
  Filled 2013-12-10: qty 2

## 2013-12-10 MED ORDER — METOCLOPRAMIDE HCL 5 MG/ML IJ SOLN
10.0000 mg | Freq: Four times a day (QID) | INTRAMUSCULAR | Status: DC | PRN
  Administered 2013-12-10 – 2013-12-11 (×3): 10 mg via INTRAVENOUS
  Filled 2013-12-10 (×3): qty 2

## 2013-12-10 MED ORDER — HYDROMORPHONE HCL PF 2 MG/ML IJ SOLN
2.0000 mg | INTRAMUSCULAR | Status: DC | PRN
  Administered 2013-12-11 – 2013-12-13 (×13): 2 mg via INTRAVENOUS
  Filled 2013-12-10 (×13): qty 1

## 2013-12-10 MED ORDER — FAMOTIDINE IN NACL 20-0.9 MG/50ML-% IV SOLN
20.0000 mg | Freq: Once | INTRAVENOUS | Status: AC
  Administered 2013-12-10: 20 mg via INTRAVENOUS
  Filled 2013-12-10: qty 50

## 2013-12-10 MED ORDER — ONDANSETRON HCL 4 MG PO TABS: 4.0000 mg | ORAL_TABLET | ORAL | Status: DC | PRN

## 2013-12-10 MED ORDER — ENOXAPARIN SODIUM 40 MG/0.4ML ~~LOC~~ SOLN
40.0000 mg | SUBCUTANEOUS | Status: DC
Start: 2013-12-10 — End: 2013-12-11
  Filled 2013-12-10 (×2): qty 0.4

## 2013-12-10 MED ORDER — HYDROMORPHONE HCL PF 1 MG/ML IJ SOLN
1.0000 mg | Freq: Once | INTRAMUSCULAR | Status: AC
  Administered 2013-12-10: 1 mg via INTRAVENOUS
  Filled 2013-12-10: qty 1

## 2013-12-10 MED ORDER — ONDANSETRON HCL 4 MG/2ML IJ SOLN
4.0000 mg | INTRAMUSCULAR | Status: DC | PRN
  Administered 2013-12-11 – 2013-12-13 (×10): 4 mg via INTRAVENOUS
  Filled 2013-12-10 (×11): qty 2

## 2013-12-10 MED ORDER — SODIUM CHLORIDE 0.9 % IV SOLN: INTRAVENOUS | Status: DC

## 2013-12-10 MED ORDER — PROMETHAZINE HCL 25 MG PO TABS
25.0000 mg | ORAL_TABLET | Freq: Once | ORAL | Status: AC
Start: 2013-12-10 — End: 2013-12-10
  Administered 2013-12-10: 25 mg via ORAL
  Filled 2013-12-10: qty 1

## 2013-12-10 MED ORDER — PRENATAL MULTIVITAMIN CH
1.0000 | ORAL_TABLET | Freq: Every day | ORAL | Status: DC
  Administered 2013-12-11 – 2013-12-13 (×3): 1 via ORAL
  Filled 2013-12-10 (×3): qty 1

## 2013-12-10 MED ORDER — LACTATED RINGERS IV BOLUS (SEPSIS)
1000.0000 mL | Freq: Once | INTRAVENOUS | Status: AC
  Administered 2013-12-10: 1000 mL via INTRAVENOUS

## 2013-12-10 MED ORDER — ONDANSETRON 8 MG/NS 50 ML IVPB
8.0000 mg | Freq: Once | INTRAVENOUS | Status: AC
  Administered 2013-12-10: 8 mg via INTRAVENOUS
  Filled 2013-12-10: qty 8

## 2013-12-10 MED ORDER — HYDROMORPHONE HCL PF 1 MG/ML IJ SOLN
0.5000 mg | Freq: Once | INTRAMUSCULAR | Status: AC
  Administered 2013-12-10: 0.5 mg via INTRAVENOUS
  Filled 2013-12-10: qty 1

## 2013-12-10 MED ORDER — OXYCODONE-ACETAMINOPHEN 5-325 MG PO TABS
1.0000 | ORAL_TABLET | Freq: Four times a day (QID) | ORAL | Status: DC | PRN
  Administered 2013-12-11: 2 via ORAL
  Filled 2013-12-10 (×2): qty 2

## 2013-12-10 MED ORDER — PANTOPRAZOLE SODIUM 40 MG PO TBEC
40.0000 mg | DELAYED_RELEASE_TABLET | Freq: Two times a day (BID) | ORAL | Status: DC
  Administered 2013-12-10 – 2013-12-13 (×6): 40 mg via ORAL
  Filled 2013-12-10 (×7): qty 1

## 2013-12-10 NOTE — MAU Provider Note (Signed)
History     CSN: [id number]  Arrival date and time: 12/10/13 5573   First Provider Initiated Contact with Patient 12/10/13 [phone]      Chief Complaint  Patient presents with  . Abdominal Pain  . Emesis During Pregnancy   HPI  [name] [name] is 33 year old female, G3P0020, GA [redacted]w[redacted]d s/p lap cholecystectomy (11/21/13) presenting for severe epigastric pain and N/V since last night (patient has a history of long standing epigastric pain). The patient reports that since her lap chole, she has had daily intermittent abdominal pain with N/V 2-3 times per day. She has taken percocet daily but without relief.  Last night, the patient awoke with a sharp, burning, pressure type pain in the epigastric area that has been constant and is now "going up" although the patient denies chest pain.  She also reports vomiting 8 times throughout the night and continues to dry heave here in the MAU but without producing any vomit. At home, the patient describes the vomit as foamy and yellow but without blood. This morning she called her OB physician who advised her to come in.  Of note, she had recently been seen for follow up by Dr. GCarlean Purlon 12/06/13 and reported the same complaints noting no improvement in her symptoms since her surgery, pt also saw her surgeon Dr. RRosendo Grosyesterday who agreed with GI's plan for follow up [place]  Per the patient, an abdominal ultrasound was scheduled for 12/11/13. The patient does have a history of narcotic abuse and was provided with 40 Percocet on 12/03/2013. She also has a history of H. Pylori infection and is s/p treatment.   OB History   Grav Para Term Preterm Abortions TAB SAB Ect Mult Living   3 0   2 2    0      Past Medical History  Diagnosis Date  . Narcotic abuse   . Anxiety   . Depression   . Chronic headaches   . H. pylori infection     Past Surgical History  Procedure Laterality Date  . Abortions    . Wisdom tooth extraction    . Cholecystectomy N/A  11/21/2013    Procedure: LAPAROSCOPIC CHOLECYSTECTOMY ;  Surgeon: ARalene Ok MD;  Location: [facility];  Service: General;  Laterality: N/A;    Family History  Problem Relation Age of Onset  . Diabetes Mother   . Cancer Maternal Aunt   . Cancer Maternal Uncle   . Cancer Paternal Aunt   . Cancer Paternal Uncle     History  Substance Use Topics  . Smoking status: Current Every Day Smoker -- 1.00 packs/day for 15 years    Types: Cigarettes  . Smokeless tobacco: Never Used     Comment: Patient has cut back to 1 pack daily from 2 packs daily  . Alcohol Use: No     Comment: recovering addict    Allergies: No Known Allergies  Prescriptions prior to admission  Medication Sig Dispense Refill  . omeprazole (PRILOSEC) 40 MG capsule Take 1 capsule (40 mg total) by mouth 2 (two) times daily.  60 capsule  1  . oxyCODONE-acetaminophen (ROXICET) 5-325 MG per tablet Take 1-2 tablets by mouth every 6 (six) hours as needed for severe pain.  40 tablet  0  . Prenatal Vit-Fe Fumarate-FA (PRENATAL MULTIVITAMIN) TABS tablet Take 1 tablet by mouth daily at 12 noon.      . promethazine (PHENERGAN) 12.5 MG tablet Take 1 tablet (12.5 mg total) by  mouth every 6 (six) hours as needed for nausea or vomiting.  30 tablet  3  . sucralfate (CARAFATE) 1 G tablet Take 1 tablet (1 g total) by mouth 4 (four) times daily -  with meals and at bedtime.  60 tablet  0   Results for orders placed during the hospital encounter of 12/10/13 (from the past 48 hour(s))  CBC     Status: Abnormal   Collection Time    12/10/13  9:21 AM      Result Value Ref Range   WBC 13.6 (*) 4.0 - 10.5 K/uL   RBC 4.56  3.87 - 5.11 MIL/uL   Hemoglobin 13.4  12.0 - 15.0 g/dL   HCT 38.9  36.0 - 46.0 %   MCV 85.3  78.0 - 100.0 fL   MCH 29.4  26.0 - 34.0 pg   MCHC 34.4  30.0 - 36.0 g/dL   RDW 14.6  11.5 - 15.5 %   Platelets 319  150 - 400 K/uL  COMPREHENSIVE METABOLIC PANEL     Status: Abnormal   Collection Time    12/10/13  9:21 AM       Result Value Ref Range   Sodium 135 (*) 137 - 147 mEq/L   Potassium 4.1  3.7 - 5.3 mEq/L   Chloride 98  96 - 112 mEq/L   CO2 24  19 - 32 mEq/L   Glucose, Bld 89  70 - 99 mg/dL   BUN 7  6 - 23 mg/dL   Creatinine, Ser 0.49 (*) 0.50 - 1.10 mg/dL   Calcium 9.7  8.4 - 10.5 mg/dL   Total Protein 6.9  6.0 - 8.3 g/dL   Albumin 3.5  3.5 - 5.2 g/dL   AST 18  0 - 37 U/L   ALT 28  0 - 35 U/L   Alkaline Phosphatase 102  39 - 117 U/L   Total Bilirubin 0.3  0.3 - 1.2 mg/dL   GFR calc non Af Amer >90  >90 mL/min   GFR calc Af Amer >90  >90 mL/min   Comment: (NOTE)     The eGFR has been calculated using the CKD EPI equation.     This calculation has not been validated in all clinical situations.     eGFR's persistently <90 mL/min signify possible Chronic Kidney     Disease.  URINALYSIS, ROUTINE W REFLEX MICROSCOPIC     Status: Abnormal   Collection Time    12/10/13  9:31 AM      Result Value Ref Range   Color, Urine YELLOW  YELLOW   APPearance HAZY (*) CLEAR   Specific Gravity, Urine 1.020  1.005 - 1.030   pH 6.5  5.0 - 8.0   Glucose, UA NEGATIVE  NEGATIVE mg/dL   Hgb urine dipstick TRACE (*) NEGATIVE   Bilirubin Urine NEGATIVE  NEGATIVE   Ketones, ur NEGATIVE  NEGATIVE mg/dL   Protein, ur NEGATIVE  NEGATIVE mg/dL   Urobilinogen, UA 0.2  0.0 - 1.0 mg/dL   Nitrite NEGATIVE  NEGATIVE   Leukocytes, UA SMALL (*) NEGATIVE  URINE MICROSCOPIC-ADD ON     Status: Abnormal   Collection Time    12/10/13  9:31 AM      Result Value Ref Range   Squamous Epithelial / LPF RARE  RARE   WBC, UA 7-10  <3 WBC/hpf   Bacteria, UA FEW (*) RARE    US Abdomen Complete  12/10/2013   CLINICAL DATA:  Epigastric pain. Cholecystectomy  11/21/2013. Fifteen weeks pregnant.  EXAM: ULTRASOUND ABDOMEN COMPLETE  COMPARISON:  None.  FINDINGS: Gallbladder:  Surgically absent.  Common bile duct:  Diameter: 4 mm, normal.  Liver:  No focal lesion identified. Within normal limits in parenchymal echogenicity.  IVC:   Suboptimally visualized due to pregnancy.  Pancreas:  Suboptimally visualized.  Spleen:  5.2 cm.  Normal.  Right Kidney:  Length: 12 cm. Echogenicity within normal limits. No mass or hydronephrosis visualized.  Left Kidney:  Length: 11.6 cm. Echogenicity within normal limits. No mass or hydronephrosis visualized.  Abdominal aorta:  No aneurysm visualized.  Other findings:  None.  IMPRESSION: Cholecystectomy. No acute abnormality. Suboptimal visualization of viscera due to overlying bowel gas in the upper abdomen.   Electronically Signed   By: Dereck Ligas M.D.   On: 12/10/2013 10:48   Review of Systems  Constitutional: Negative for fever and chills.  Respiratory: Negative for cough and shortness of breath.   Cardiovascular: Negative for chest pain and PND.  Gastrointestinal: Positive for nausea, vomiting (8 times since last night, no episodes while in the MAU), abdominal pain (Epigastric), diarrhea and constipation. Negative for blood in stool and melena.  Genitourinary: Positive for flank pain (mild bilateral shooting flank pain). Negative for dysuria, urgency, frequency and hematuria (as well as vaginal bleeding).   Physical Exam   Blood pressure 114/91, pulse 98, temperature 98.2 F (36.8 C), temperature source Oral, resp. rate 18, height 5' 6"  (1.676 m), weight 72.757 kg (160 lb 6.4 oz), last menstrual period 08/27/2013, SpO2 100.00%.  Physical Exam  Constitutional: She is oriented to person, place, and time. She appears well-developed and well-nourished. No distress (but patient is tearful).  HENT:  Head: Normocephalic.  Eyes: Pupils are equal, round, and reactive to light.  Neck: No tracheal deviation present.  Cardiovascular: Regular rhythm and normal heart sounds.  Exam reveals no gallop and no friction rub.   No murmur heard. Tachycardic (HR 112)  Respiratory: Effort normal and breath sounds normal. No stridor. No respiratory distress. She has no wheezes. She has no rales. She  exhibits no tenderness.  GI: Soft. Bowel sounds are normal. She exhibits no distension. There is tenderness (diffusely tender to light palpation and percussion). There is no guarding.  Incisions appear dry; intact, no oozing, open to air.   Musculoskeletal: She exhibits no edema and no tenderness.  Neurological: She is alert and oriented to person, place, and time.  Skin: Skin is warm and dry. She is not diaphoretic.  Psychiatric: Her mood appears anxious (Pt is tearful. ).    MAU Course  Procedures None  MDM +fht: 155 via fetal doppler  Obtain CBC, CMP, UA and abd [place]. For pain, provide dilaudid IV and for nausea provide IV Zofran and Pepcid. 1120: Discussed US findings and lab findings with Dr. Harolyn Rutherford who agrees it is appropriate at this time to transfer patient to [facility] for further workup. Pt may benefit from ERCP and/or EGD.  1130: Discussed plan of care with patient who agrees with transfer. Pt states she is feeling some relief in pain following dilaudid. Pt declined the need for further pain medication prior to transfer. Spoke to Dr. Rogene Houston who accepts and agree's to patients transfer.   Assessment and Plan  [name] [name] is a 33 year old female, G3P0020, [place] [redacted]w[redacted]d s/p lap cholecystectomy (11/21/13) presenting for persistent severe epigastric pain and N/V since last night. Symptoms have not improved s/p lap chole; source of pain and vomiting is unclear but  may be gastric (ulcer, acid reflux, gas), infectious (although unlikely given longstanding symptoms and lack of progression) or inflammatory in etiology. Labs and abd [place] pending.   A:  Epigastric pain   Nausea and vomiting S/P Cholecystectomy (3/12)  P:  Transfer to [facility] ED  Jaynee Eagles 12/10/2013, 9:49 AM   Evaluation and management procedures were performed by the PA student under my supervision and collaboration. I have reviewed the note and chart, and I agree with the management and plan.  Darrelyn Hillock Carlis Blanchard, NP 12/10/2013 11:50 AM

## 2013-12-10 NOTE — MAU Provider Note (Signed)
Attestation of Attending Supervision of Advanced Practitioner (PA/CNM/NP): Evaluation and management procedures were performed by the Advanced Practitioner under my supervision and collaboration.  I have reviewed the Advanced Practitioner's note and chart, and I agree with the management and plan.  Laterrance Nauta, MD, FACOG Attending Obstetrician & Gynecologist Faculty Practice, [facility]  

## 2013-12-10 NOTE — MAU Provider Note (Signed)
Pt requests pain medication prior to transfer. 2 mg IV dilaudid ordered  Iona HansenJennifer Irene Rasch, NP 12/10/2013 12:10 PM

## 2013-12-10 NOTE — ED Provider Notes (Signed)
CSN: [id number]     Arrival date & time 12/10/13  4098 History   First MD Initiated Contact with Patient 12/10/13 1325     Chief Complaint  Patient presents with  . Abdominal Pain  . Emesis During Pregnancy     (Consider location/radiation/quality/duration/timing/severity/associated sxs/prior Treatment) Patient is a 33 y.o. female presenting with abdominal pain. The history is provided by the patient. No language interpreter was used.  Abdominal Pain Pain location:  Epigastric Pain quality: burning   Pain radiates to:  LUQ and RUQ Pain severity:  Severe Onset quality:  Unable to specify Duration: 4 months. Timing:  Constant Progression:  Worsening Chronicity:  Chronic Context: previous surgery   Context: not sick contacts   Relieved by: narcotics. Worsened by:  Nothing tried Ineffective treatments:  OTC medications Associated symptoms: anorexia, nausea and vomiting   Associated symptoms: no constipation, no cough, no diarrhea, no dysuria, no fever, no shortness of breath, no vaginal bleeding and no vaginal discharge   Nausea:    Severity:  Severe   Nausea duration: 4 months. Vomiting:    Emesis appearance: retching.   Severity:  Severe   Vomiting duration: 4 months.   Timing:  Constant   Progression:  Worsening Risk factors: pregnancy     Past Medical History  Diagnosis Date  . Narcotic abuse   . Anxiety   . Depression   . Chronic headaches   . H. pylori infection   . UTI (lower urinary tract infection)    Past Surgical History  Procedure Laterality Date  . Abortions    . Wisdom tooth extraction    . Cholecystectomy N/A 11/21/2013    Procedure: LAPAROSCOPIC CHOLECYSTECTOMY ;  Surgeon: Axel Filler, MD;  Location: [facility];  Service: General;  Laterality: N/A;   Family History  Problem Relation Age of Onset  . Diabetes Mother   . Cancer Maternal Aunt   . Cancer Maternal Uncle   . Cancer Paternal Aunt   . Cancer Paternal Uncle    History  Substance Use  Topics  . Smoking status: Current Every Day Smoker -- 1.00 packs/day for 15 years    Types: Cigarettes  . Smokeless tobacco: Never Used     Comment: Patient has cut back to 1 pack daily from 2 packs daily  . Alcohol Use: No     Comment: recovering addict   OB History   Grav Para Term Preterm Abortions TAB SAB Ect Mult Living   3 0   2 2    0     Review of Systems  Constitutional: Negative for fever.  Respiratory: Negative for cough and shortness of breath.   Gastrointestinal: Positive for nausea, vomiting, abdominal pain and anorexia. Negative for diarrhea and constipation.  Genitourinary: Negative for dysuria, vaginal bleeding and vaginal discharge.      Allergies  Review of patient's allergies indicates no known allergies.  Home Medications   Current Outpatient Rx  Name  Route  Sig  Dispense  Refill  . acetaminophen (TYLENOL) 325 MG tablet   Oral   Take 650 mg by mouth daily as needed for moderate pain.         Marland Kitchen omeprazole (PRILOSEC) 40 MG capsule   Oral   Take 1 capsule (40 mg total) by mouth 2 (two) times daily.   60 capsule   1   . oxyCODONE-acetaminophen (ROXICET) 5-325 MG per tablet   Oral   Take 1-2 tablets by mouth every 6 (six) hours as needed  for severe pain.   40 tablet   0   . Prenatal Vit-Fe Fumarate-FA (PRENATAL MULTIVITAMIN) TABS tablet   Oral   Take 1 tablet by mouth daily at 12 noon.         . promethazine (PHENERGAN) 12.5 MG tablet   Oral   Take 1 tablet (12.5 mg total) by mouth every 6 (six) hours as needed for nausea or vomiting.   30 tablet   3   . sucralfate (CARAFATE) 1 G tablet   Oral   Take 1 tablet (1 g total) by mouth 4 (four) times daily -  with meals and at bedtime.   60 tablet   0    BP 107/65  Pulse 89  Temp(Src) 97.7 F (36.5 C) (Oral)  Resp 16  Ht 5\' 6"  (1.676 m)  Wt 160 lb 6.4 oz (72.757 kg)  BMI 25.90 kg/m2  SpO2 97%  LMP 08/27/2013 Physical Exam  ED Course  Procedures (including critical care  time) Labs Review Labs Reviewed  CBC - Abnormal; Notable for the following:    WBC 13.6 (*)    All other components within normal limits  COMPREHENSIVE METABOLIC PANEL - Abnormal; Notable for the following:    Sodium 135 (*)    Creatinine, Ser 0.49 (*)    All other components within normal limits  URINALYSIS, ROUTINE W REFLEX MICROSCOPIC - Abnormal; Notable for the following:    APPearance HAZY (*)    Hgb urine dipstick TRACE (*)    Leukocytes, UA SMALL (*)    All other components within normal limits  URINE MICROSCOPIC-ADD ON - Abnormal; Notable for the following:    Bacteria, UA FEW (*)    All other components within normal limits  URINE RAPID DRUG SCREEN (HOSP PERFORMED)  LIPASE, BLOOD   Imaging Review US Abdomen Complete  12/10/2013   CLINICAL DATA:  Epigastric pain. Cholecystectomy 11/21/2013. Fifteen weeks pregnant.  EXAM: ULTRASOUND ABDOMEN COMPLETE  COMPARISON:  None.  FINDINGS: Gallbladder:  Surgically absent.  Common bile duct:  Diameter: 4 mm, normal.  Liver:  No focal lesion identified. Within normal limits in parenchymal echogenicity.  IVC:  Suboptimally visualized due to pregnancy.  Pancreas:  Suboptimally visualized.  Spleen:  5.2 cm.  Normal.  Right Kidney:  Length: 12 cm. Echogenicity within normal limits. No mass or hydronephrosis visualized.  Left Kidney:  Length: 11.6 cm. Echogenicity within normal limits. No mass or hydronephrosis visualized.  Abdominal aorta:  No aneurysm visualized.  Other findings:  None.  IMPRESSION: Cholecystectomy. No acute abnormality. Suboptimal visualization of viscera due to overlying bowel gas in the upper abdomen.   Electronically Signed   By: Andreas Newport M.D.   On: 12/10/2013 10:48     EKG Interpretation None      MDM   Final diagnoses:  Abdominal pain, epigastric    Pt is a 33 y.o. female with Pmhx as above including narcotic abuse, PUD w/ h pylori, who presents with chronic epigastric pain for about 4 months described as  constant, burning, radiation across upper abdomen assoc with nausea, retching. Pt sent from MAU as she is [redacted] wks pregnant, and was cleared from a pregnancy standpoint. No fever, chills, d/a, dysuria, vag bleeding. She saw both surgery and GI in f/u this week, was scheduled for an [place] which she had today which was negative for acute findings. She has no acute findings of her CBC (unchanged mild WBC elevation), CMP, UA.  Her prilosec was inc to BID 4  days ago. Given timing of this constant pain, doubt acute-life threatening surgical emergency.  I also do not feel that pt requires further imaging at this point and would not get CT as she is pregnant. We discusses the risks of increasing narcotic use. Have discussed with  GI, Dr. Leone PayorGessner, who recommends inpt observation, MRCP w/o contrast, possible endoscopy. [facility] consulted for admission.        Shanna CiscoMegan E Kierre Deines, MD 12/11/13 [phone]

## 2013-12-10 NOTE — MAU Note (Addendum)
States pain medicine and phenergan are not helping at all.  Last dose @ 0600. Also took omeprasole. States she is vomiting yellow liquid and white foam. States she saw the surgeon yesterday. States he said that a stone may be lodged in the duct. She has an appt. tomorrow for U/S. Surgeon also told her to F/U with her GI doctor. Prior to dx of gall stones and then surgery, her GI doctor was going to do an upper GI, but when it was discovered that she was pregnant, he would not do the exam. Patient states it feels like "something is eating away something inside of her." She states she feels that something else is going on besides gall bladder problem. Patient is tearful.

## 2013-12-10 NOTE — ED Notes (Addendum)
[facility] Pt had gallbladder surgery.  Pt presents with increased stomach pain,N/V. Pt went to scheduled MD appt. 12/09/13. Pt states symptoms are the same as stated then and now.. Pt was informed if symptoms continued to go to MAU. Pt is pregant. Cleared with pregnancy.

## 2013-12-10 NOTE — MAU Note (Signed)
Patient states she started having upper abdominal pain with constant vomiting last night. Denies diarrhea, bleeding or discharge. State she had a cholestectomy on 3-12 and has not had complete relief from the pain

## 2013-12-10 NOTE — H&P (Signed)
[facility] History and Physical  [name] [name] VQQ:[id number] DOB: 06-29-1981 DOA: 12/10/2013  Referring physician: Dr. Micheline Maze PCP: PROVIDER NOT IN SYSTEM   Chief Complaint:  Nausea, vomiting and abdominal pain worsened for one day   HPI:  33 year old female who is [redacted] weeks pregnant ( G2P1) who has ongoing nausea and vomiting since November last year, was found to have H. pylori and was treated for that but did not improve her symptoms. She was found to have gallstones and was scheduled to have a cholecystectomy done during her second trimester however came to the ED in 3 weeks back with abdominal pain and was found to have altered cholecystitis. She underwent cholecystectomy on 3/12. However since her cholecystectomy her symptoms have not improved but instead been progressively getting worse with sharp epigastric pain radiating to the sides and associated with several episodes of nausea and 2-3 episodes of vomiting every day. She was seen in the GI clinic for his back and her omeprazole dose was doubled. She was also seen by her surgeon a few days back as a hospital followup. Patient presented to the [facility] ED today with worsened abdominal pain with nausea and vomiting since last night. Her vitals were stable. Blood work done showed leukocytosis with WBC of 13.6 thousand with normal chemistry. After discussion with ED physician at with a long patient was sent here. An ultrasound of the abdomen was done which was unremarkable. Patient given 2 doses of IV Dilaudid, 1 L of IV Ringer's lactate, 1 dose of IV Zofran and Phenergan, 1 dose of IV Pepcid which mildly improved her symptoms.  GI was consulted by ED physician who planned on getting an MRCP of her abdomen. [facility] called for admission on observation  Patient denies headache, dizziness, fever, chills,chest pain, palpitations, SOB, abdominal pain, bowel or urinary symptoms. Denies change in weight or appetite. She  continues to smoke one pack per day. Reports cutting down from 2PPD few month back.  Review of Systems:  Constitutional: Denies fever, chills, diaphoresis, appetite change and fatigue.  HEENT: Denies photophobia, eye pain,  ear pain, congestion, sore throat, rhinorrhea, sneezing, mouth sores, trouble swallowing, Respiratory: Denies SOB, DOE, cough, chest tightness,  and wheezing.   Cardiovascular: Denies chest pain, palpitations and leg swelling.  Gastrointestinal:  nausea, vomiting, abdominal pain, Denies diarrhea, constipation, blood in stool and abdominal distention.  Genitourinary: Denies dysuria, urgency, frequency, hematuria, flank pain and difficulty urinating.  Endocrine: Denies hot or cold intolerance,  Musculoskeletal: Denies myalgias, back pain, joint swelling, arthralgias and gait problem.  Skin: Denies pallor, rash and wound.  Neurological: Denies dizziness, seizures, syncope, weakness, light-headedness, numbness and headaches.     Past Medical History  Diagnosis Date  . Narcotic abuse   . Anxiety   . Depression   . Chronic headaches   . H. pylori infection   . UTI (lower urinary tract infection)    Past Surgical History  Procedure Laterality Date  . Abortions    . Wisdom tooth extraction    . Cholecystectomy N/A 11/21/2013    Procedure: LAPAROSCOPIC CHOLECYSTECTOMY ;  Surgeon: Axel Filler, MD;  Location: [facility];  Service: General;  Laterality: N/A;   Social History:  reports that she has been smoking Cigarettes.  She has a 15 pack-year smoking history. She has never used smokeless tobacco. She reports that she does not drink alcohol or use illicit drugs.  No Known Allergies  Family History  Problem Relation Age of Onset  . Diabetes Mother   .  Cancer Maternal Aunt   . Cancer Maternal Uncle   . Cancer Paternal Aunt   . Cancer Paternal Uncle     Prior to Admission medications   Medication Sig Start Date End Date Taking? Authorizing Provider  acetaminophen  (TYLENOL) 325 MG tablet Take 650 mg by mouth daily as needed for moderate pain.   Yes Historical Provider, MD  omeprazole (PRILOSEC) 40 MG capsule Take 1 capsule (40 mg total) by mouth 2 (two) times daily. 12/06/13  Yes Bennie D. Zehr, PA-C  oxyCODONE-acetaminophen (ROXICET) 5-325 MG per tablet Take 1-2 tablets by mouth every 6 (six) hours as needed for severe pain. 12/02/13  Yes Arthor CaptainAbigail Harris, PA-C  Prenatal Vit-Fe Fumarate-FA (PRENATAL MULTIVITAMIN) TABS tablet Take 1 tablet by mouth daily at 12 noon.   Yes Historical Provider, MD  promethazine (PHENERGAN) 12.5 MG tablet Take 1 tablet (12.5 mg total) by mouth every 6 (six) hours as needed for nausea or vomiting. 11/06/13  Yes Wilmer FloorLisa A Leftwich-Kirby, CNM  sucralfate (CARAFATE) 1 G tablet Take 1 tablet (1 g total) by mouth 4 (four) times daily -  with meals and at bedtime. 12/02/13  Yes Arthor CaptainAbigail Harris, PA-C     Physical Exam:  Filed Vitals:   12/10/13 1224 12/10/13 1254 12/10/13 1255 12/10/13 1603  BP: 97/58  107/65 105/55  Pulse: 91  89 84  Temp:   97.7 F (36.5 C)   TempSrc:   Oral   Resp: 18  16 16   Height:      Weight:      SpO2:  97% 97% 96%    Constitutional: Vital signs reviewed.  Patient is a middle aged female lying in bed in no acute distress. HEENT: no pallor, no icterus, dry oral mucosa, no cervical lymphadenopathy Cardiovascular: RRR, S1 normal, S2 normal, no MRG Chest: CTAB, no wheezes, rales, or rhonchi Abdominal: Soft. non-distended, bowel sounds are normal, no masses, organomegaly, tender to palpation over epigastric and right upper quadrant (mainly localized over epigastric area) Ext: warm, no edema Neurological: A&O x3, non focal  Labs on Admission:  Basic Metabolic Panel:  Recent Labs Lab 12/06/13 1506 12/10/13 0921  NA 137 135*  K 3.7 4.1  CL 104 98  CO2 26 24  GLUCOSE 82 89  BUN 7 7  CREATININE 0.6 0.49*  CALCIUM 9.8 9.7   Liver Function Tests:  Recent Labs Lab 12/06/13 1506 12/10/13 0921  AST  25 18  ALT 53* 28  ALKPHOS 98 102  BILITOT 0.1* 0.3  PROT 6.5 6.9  ALBUMIN 3.5 3.5    Recent Labs Lab 12/10/13 1530  LIPASE 27   No results found for this basename: AMMONIA,  in the last 168 hours CBC:  Recent Labs Lab 12/06/13 1506 12/10/13 0921  WBC 14.3* 13.6*  NEUTROABS 9.0*  --   HGB 12.0 13.4  HCT 36.0 38.9  MCV 87.0 85.3  PLT 321.0 319   Cardiac Enzymes: No results found for this basename: CKTOTAL, CKMB, CKMBINDEX, TROPONINI,  in the last 168 hours BNP: No components found with this basename: POCBNP,  CBG: No results found for this basename: GLUCAP,  in the last 168 hours  Radiological Exams on Admission: [place] Abdomen Complete  12/10/2013   CLINICAL DATA:  Epigastric pain. Cholecystectomy 11/21/2013. Fifteen weeks pregnant.  EXAM: ULTRASOUND ABDOMEN COMPLETE  COMPARISON:  None.  FINDINGS: Gallbladder:  Surgically absent.  Common bile duct:  Diameter: 4 mm, normal.  Liver:  No focal lesion identified. Within normal limits in parenchymal echogenicity.  IVC:  Suboptimally visualized due to pregnancy.  Pancreas:  Suboptimally visualized.  Spleen:  5.2 cm.  Normal.  Right Kidney:  Length: 12 cm. Echogenicity within normal limits. No mass or hydronephrosis visualized.  Left Kidney:  Length: 11.6 cm. Echogenicity within normal limits. No mass or hydronephrosis visualized.  Abdominal aorta:  No aneurysm visualized.  Other findings:  None.  IMPRESSION: Cholecystectomy. No acute abnormality. Suboptimal visualization of viscera due to overlying bowel gas in the upper abdomen.   Electronically Signed   By: Andreas Newport M.D.   On: 12/10/2013 10:48    EKG:   Assessment/Plan  Principal Problem:   Abdominal pain with persistent nausea and vomiting Admit to MedSurg under observation IV hydration with normal saline, when necessary Zofran and Reglan for nausea and vomiting. Continue home dose Percocet. Would add when necessary Dilaudid for severe pain.  -Continue Protonix twice a  day. Added twice a day Pepcid. Continue sucralfate -Clear liquid diet. Advance as tolerated. -GI to consult in the a.m.. Ordered for MRCP to evaluate for any CBD obstruction.  -Counseled strongly on smoking cessation as current symptoms are worsened with smoking.  Active Problems: Calculus cholecystitis Status post a recent cholecystectomy 3 weeks back. Was seen by her surgeon a few days back for followup.  History of H. pylori Treated in the past     Tobacco abuse Patient is a heavy smoker. Counseled strongly on cessation. Refused nicotine patch  Anxiety and depression Stable. Not on any medications.     Diet: Clear liquids  DVT prophylaxis: sq lovenox   Code Status: full code Family Communication: None at bedside Disposition Plan: Home once improved  Horice Carrero [facility] Pager [phone]  Total time spent on admission : 50 minutes  If 7PM-7AM, please contact night-coverage [email or web address] Password [facility] 12/10/2013, 5:17 PM

## 2013-12-10 NOTE — Progress Notes (Signed)
Ana transferred call to me from front desk pt informed me that she had gallbladder surgery and that she is currently having abdominal pain and vomiting.  Looking at pt's chart pt went to her post op appt scheduled on 12/09/13.  I asked pt if the symptoms she is describing are the same ones she described to her provider in her post op appt, pt stated "yes they are".  I then asked patient what the provider recommended for her to do.  Pt stated "that the provider told her to follow up with her GI doctor, she has an US scheduled".  I advised pt to follow the recommendations of her post op provider to follow up with her GI provider and if her pain or vomiting gets worse to please go to MAU.  Pt stated understanding to those recommendations with no further questions.

## 2013-12-10 NOTE — ED Notes (Signed)
Bed: WA10 Expected date:  Expected time:  Means of arrival:  Comments: EMS-abdominal pain 

## 2013-12-10 NOTE — MAU Provider Note (Signed)
Attestation of Attending Supervision of Advanced Practitioner (PA/CNM/NP): Evaluation and management procedures were performed by the Advanced Practitioner under my supervision and collaboration.  I have reviewed the Advanced Practitioner's note and chart, and I agree with the management and plan.  Dsean Vantol, MD, FACOG Attending Obstetrician & Gynecologist Faculty Practice, [facility]  

## 2013-12-10 NOTE — Progress Notes (Signed)
Pt arrived to room 1318 via wheelchair from ED.  Pt is alert and oriented. Ambulate in room. Oriented pt to room and unit.  Pt c/o 9/10 abdominal pain.  Call bell is within reach.  Will continue to monitor.

## 2013-12-11 ENCOUNTER — Encounter (HOSPITAL_COMMUNITY): Payer: Self-pay | Admitting: *Deleted

## 2013-12-11 ENCOUNTER — Ambulatory Visit (HOSPITAL_COMMUNITY): Admission: RE | Admit: 2013-12-11 | Payer: Medicaid Other | Source: Ambulatory Visit

## 2013-12-11 DIAGNOSIS — R112 Nausea with vomiting, unspecified: Secondary | ICD-10-CM

## 2013-12-11 DIAGNOSIS — F3289 Other specified depressive episodes: Secondary | ICD-10-CM

## 2013-12-11 DIAGNOSIS — F329 Major depressive disorder, single episode, unspecified: Secondary | ICD-10-CM

## 2013-12-11 DIAGNOSIS — F411 Generalized anxiety disorder: Secondary | ICD-10-CM

## 2013-12-11 LAB — BASIC METABOLIC PANEL
BUN: 4 mg/dL — ABNORMAL LOW (ref 6–23)
CHLORIDE: 102 meq/L (ref 96–112)
CO2: 24 mEq/L (ref 19–32)
Calcium: 8.7 mg/dL (ref 8.4–10.5)
Creatinine, Ser: 0.47 mg/dL — ABNORMAL LOW (ref 0.50–1.10)
Glucose, Bld: 82 mg/dL (ref 70–99)
POTASSIUM: 3.8 meq/L (ref 3.7–5.3)
SODIUM: 136 meq/L — AB (ref 137–147)

## 2013-12-11 LAB — H. PYLORI ANTIBODY, IGG: H Pylori IgG: 2.04 {ISR} — ABNORMAL HIGH

## 2013-12-11 MED ORDER — BOOST / RESOURCE BREEZE PO LIQD
1.0000 | Freq: Three times a day (TID) | ORAL | Status: DC
  Administered 2013-12-12 – 2013-12-13 (×5): 1 via ORAL

## 2013-12-11 MED ORDER — OXYCODONE-ACETAMINOPHEN 5-325 MG PO TABS
1.0000 | ORAL_TABLET | Freq: Three times a day (TID) | ORAL | Status: DC | PRN
  Administered 2013-12-13: 2 via ORAL
  Filled 2013-12-11 (×4): qty 2

## 2013-12-11 NOTE — Consult Note (Signed)
Consultation  Referring Provider: [facility]     Primary Care Physician:  PROVIDER NOT IN SYSTEM Primary Gastroenterologist:  Stan Head, MD      Reason for Consultation:  Abdominal pain            HPI:   [name] [name] is a 33 y.o. female known to Dr. Leone Payor in our office. In November pain began having epigastric pain, nausea, and vomiting. She was found to have gallstones and ultrasound 3/11/5 suggested acute cholecystitis. She underwent cholecystectomy 11/21/13 without resolution of symptoms. Patient is now 14-[redacted] weeks pregnant. Epigastric pain described as burning. It is refractory to BID PPI. She cannot related the pain to anything.  Patient was seen in our office 12/06/13 and scheduled for ultrasound. She came back to ED yesterday for the pain. Ultrasound unremarkable. She does have an elevated WBC in 13-14 range. LFTs and lipase normal.   Past Medical History  Diagnosis Date  . Narcotic abuse   . Anxiety   . Depression   . Chronic headaches   . H. pylori infection   . UTI (lower urinary tract infection)     Past Surgical History  Procedure Laterality Date  . Abortions    . Wisdom tooth extraction    . Cholecystectomy N/A 11/21/2013    Procedure: LAPAROSCOPIC CHOLECYSTECTOMY ;  Surgeon: Axel Filler, MD;  Location: [facility];  Service: General;  Laterality: N/A;    Family History  Problem Relation Age of Onset  . Diabetes Mother   . Cancer Maternal Aunt   . Cancer Maternal Uncle   . Cancer Paternal Aunt   . Cancer Paternal Uncle      History  Substance Use Topics  . Smoking status: Current Every Day Smoker -- 1.00 packs/day for 15 years    Types: Cigarettes  . Smokeless tobacco: Never Used     Comment: Patient has cut back to 1 pack daily from 2 packs daily  . Alcohol Use: No     Comment: recovering addict    Prior to Admission medications   Medication Sig Start Date End Date Taking? Authorizing Provider  acetaminophen (TYLENOL) 325 MG tablet Take  650 mg by mouth daily as needed for moderate pain.   Yes Historical Provider, MD  omeprazole (PRILOSEC) 40 MG capsule Take 1 capsule (40 mg total) by mouth 2 (two) times daily. 12/06/13  Yes Magdelyn D. Zehr, PA-C  oxyCODONE-acetaminophen (ROXICET) 5-325 MG per tablet Take 1-2 tablets by mouth every 6 (six) hours as needed for severe pain. 12/02/13  Yes Arthor Captain, PA-C  Prenatal Vit-Fe Fumarate-FA (PRENATAL MULTIVITAMIN) TABS tablet Take 1 tablet by mouth daily at 12 noon.   Yes Historical Provider, MD  promethazine (PHENERGAN) 12.5 MG tablet Take 1 tablet (12.5 mg total) by mouth every 6 (six) hours as needed for nausea or vomiting. 11/06/13  Yes Wilmer Floor Leftwich-Kirby, CNM  sucralfate (CARAFATE) 1 G tablet Take 1 tablet (1 g total) by mouth 4 (four) times daily -  with meals and at bedtime. 12/02/13  Yes Arthor Captain, PA-C    Current Facility-Administered Medications  Medication Dose Route Frequency Provider Last Rate Last Dose  . 0.9 %  sodium chloride infusion   Intravenous Continuous Nishant Dhungel, MD 100 mL/hr at 12/11/13 0437    . acetaminophen (TYLENOL) tablet 650 mg  650 mg Oral Daily PRN Nishant Dhungel, MD   650 mg at 12/10/13 1939  . enoxaparin (LOVENOX) injection 40 mg  40 mg Subcutaneous Q24H  Nishant Dhungel, MD      . famotidine (PEPCID) IVPB 20 mg  20 mg Intravenous Q12H Nishant Dhungel, MD   20 mg at 12/10/13 2121  . HYDROmorphone (DILAUDID) injection 2 mg  2 mg Intravenous Q4H PRN Nishant Dhungel, MD      . metoCLOPramide (REGLAN) injection 10 mg  10 mg Intravenous Q6H PRN Nishant Dhungel, MD   10 mg at 12/11/13 0643  . ondansetron (ZOFRAN) tablet 4 mg  4 mg Oral Q4H PRN Nishant Dhungel, MD       Or  . ondansetron (ZOFRAN) injection 4 mg  4 mg Intravenous Q4H PRN Nishant Dhungel, MD      . oxyCODONE-acetaminophen (PERCOCET/ROXICET) 5-325 MG per tablet 1-2 tablet  1-2 tablet Oral Q6H PRN Eddie NorthNishant Dhungel, MD   2 tablet at 12/11/13 0643  . pantoprazole (PROTONIX) EC tablet 40 mg   40 mg Oral BID Nishant Dhungel, MD   40 mg at 12/10/13 2126  . prenatal multivitamin tablet 1 tablet  1 tablet Oral Q1200 Nishant Dhungel, MD      . sucralfate (CARAFATE) tablet 1 g  1 g Oral TID WC & HS Nishant Dhungel, MD   1 g at 12/11/13 0759    Allergies as of 12/10/2013  . (No Known Allergies)    Review of Systems:    All systems reviewed and negative except where noted in HPI.   Physical Exam:  Vital signs in last 24 hours: Temp:  [97.7 F (36.5 C)-98.5 F (36.9 C)] 98.5 F (36.9 C) (04/01 0623) Pulse Rate:  [72-91] 72 (04/01 0623) Resp:  [16-18] 16 (04/01 0623) BP: (96-107)/(53-65) 96/53 mmHg (04/01 0623) SpO2:  [96 %-100 %] 100 % (04/01 0623) Weight:  [158 lb (71.668 kg)] 158 lb (71.668 kg) (03/31 1734) Last BM Date: 12/09/13 General:   Pleasant white female in NAD Head:  Normocephalic and atraumatic. Eyes:   No icterus.   Conjunctiva pink. Ears:  Normal auditory acuity. Neck:  Supple; no masses felt Lungs:  Respirations even and unlabored. Lungs clear to auscultation bilaterally.  No wheezes, crackles, or rhonchi.  Heart:  Regular rate and rhythm;  murmur heard. Abdomen:  Soft, nondistended, nontender. Normal bowel sounds. No appreciable masses or hepatomegaly.  Rectal:  Not performed.  Msk:  Symmetrical without gross deformities.  Extremities:  Without edema. Neurologic:  Alert and  oriented x4;  grossly normal neurologically. Skin:  Intact without significant lesions or rashes. Cervical Nodes:  No significant cervical adenopathy. Psych:  Alert and cooperative. Normal affect.  LAB RESULTS:  Recent Labs  12/10/13 0921  WBC 13.6*  HGB 13.4  HCT 38.9  PLT 319   BMET  Recent Labs  12/10/13 0921 12/11/13 0443  NA 135* 136*  K 4.1 3.8  CL 98 102  CO2 24 24  GLUCOSE 89 82  BUN 7 4*  CREATININE 0.49* 0.47*  CALCIUM 9.7 8.7   LFT  Recent Labs  12/10/13 0921  PROT 6.9  ALBUMIN 3.5  AST 18  ALT 28  ALKPHOS 102  BILITOT 0.3    STUDIES: [place]  Abdomen Complete  12/10/2013   CLINICAL DATA:  Epigastric pain. Cholecystectomy 11/21/2013. Fifteen weeks pregnant.  EXAM: ULTRASOUND ABDOMEN COMPLETE  COMPARISON:  None.  FINDINGS: Gallbladder:  Surgically absent.  Common bile duct:  Diameter: 4 mm, normal.  Liver:  No focal lesion identified. Within normal limits in parenchymal echogenicity.  IVC:  Suboptimally visualized due to pregnancy.  Pancreas:  Suboptimally visualized.  Spleen:  5.2  cm.  Normal.  Right Kidney:  Length: 12 cm. Echogenicity within normal limits. No mass or hydronephrosis visualized.  Left Kidney:  Length: 11.6 cm. Echogenicity within normal limits. No mass or hydronephrosis visualized.  Abdominal aorta:  No aneurysm visualized.  Other findings:  None.  IMPRESSION: Cholecystectomy. No acute abnormality. Suboptimal visualization of viscera due to overlying bowel gas in the upper abdomen.   Electronically Signed   By: Andreas Newport M.D.   On: 12/10/2013 10:48   Mr Abdomen Mrcp Wo Cm  12/10/2013   CLINICAL DATA:  Patient [redacted] weeks pregnant with gallbladder removed on 11/24/2013. Nausea and vomiting persists after gallbladder removal.  EXAM: MRI ABDOMEN WITHOUT  (INCLUDING MRCP)  TECHNIQUE: Multiplanar multisequence MR imaging of the abdomen was performed. Heavily T2-weighted images of the biliary and pancreatic ducts were obtained, and three-dimensional MRCP images were rendered by post processing.  COMPARISON:  CT ABD/PELVIS W CM dated 07/21/2013  FINDINGS: There is no pleural fluid or pericardial fluid.  No intrahepatic or extrahepatic biliary duct dilatation. No focal hepatic lesion. The common hepatic duct and common bile duct are normal caliber. The no filling defects within the common bile duct. No external compression. The common bile duct measures 4 mm. The pancreatic duct likewise is normal caliber.  Small amount of fluid within the gallbladder fossa site of surgery. No evidence of biloma or abscess. There is artifact in the  gallbladder fossa from the cholecystectomy clips. As well as gas in the duodenum.  Spleen, adrenal glands, kidneys are normal. Stomach and limited view of the small bowel and colon are unremarkable. Abdominal lymphadenopathy. No aggressive osseous lesion.  IMPRESSION: 1. Normal common bile duct without evidence of filling defect or compression. No biliary duct dilatation. 2. Normal pancreas without evidence of inflammation duct dilatation. 3. Small amount of fluid fluid in the gallbladder fossa following cholecystectomy. No organized fluid collection, abscess, or biloma.   Electronically Signed   By: Genevive Bi M.D.   On: 12/10/2013 19:32   Mr 3d Recon At Scanner  12/10/2013   CLINICAL DATA:  Patient [redacted] weeks pregnant with gallbladder removed on 11/24/2013. Nausea and vomiting persists after gallbladder removal.  EXAM: MRI ABDOMEN WITHOUT  (INCLUDING MRCP)  TECHNIQUE: Multiplanar multisequence MR imaging of the abdomen was performed. Heavily T2-weighted images of the biliary and pancreatic ducts were obtained, and three-dimensional MRCP images were rendered by post processing.  COMPARISON:  CT ABD/PELVIS W CM dated 07/21/2013  FINDINGS: There is no pleural fluid or pericardial fluid.  No intrahepatic or extrahepatic biliary duct dilatation. No focal hepatic lesion. The common hepatic duct and common bile duct are normal caliber. The no filling defects within the common bile duct. No external compression. The common bile duct measures 4 mm. The pancreatic duct likewise is normal caliber.  Small amount of fluid within the gallbladder fossa site of surgery. No evidence of biloma or abscess. There is artifact in the gallbladder fossa from the cholecystectomy clips. As well as gas in the duodenum.  Spleen, adrenal glands, kidneys are normal. Stomach and limited view of the small bowel and colon are unremarkable. Abdominal lymphadenopathy. No aggressive osseous lesion.  IMPRESSION: 1. Normal common bile duct  without evidence of filling defect or compression. No biliary duct dilatation. 2. Normal pancreas without evidence of inflammation duct dilatation. 3. Small amount of fluid fluid in the gallbladder fossa following cholecystectomy. No organized fluid collection, abscess, or biloma.   Electronically Signed   By: Loura Halt.D.  On: 12/10/2013 19:32   PREVIOUS ENDOSCOPIES:            none   Impression / Plan:   1. Epigastric pain, nausea and vomiting since November despite cholecystectomy for cholelithiasis mid March. Ultrasound and MRCP unrevealing. For further evaluation patient will be scheduled for EGD. The benefits, risks, and potential complications of EGD with possible biopsies were discussed with the patient and she agrees to proceed.   2. [redacted] weeks gestation.   Thanks   LOS: 1 day   Willette Cluster  12/11/2013, 9:36 AM   ________________________________________________________________________  [facility] MD note:  I personally examined the patient, reviewed the data and agree with the assessment and plan described above.  Previous very high NSAID use (before stopping in November she was taking 6 ibuprofen daily), was treated for H. Pylori + serology back then as well.  Currently with epig pain, vomiting.  Removal of GB did not help her pains.  LFTS normal MRCP normal.  Perhaps this is PUD.  I would like to proceed with EGD, was thinking to do it today however anesthesia assistance is not possible until tomorrow (prefer anesthesia to help since she is pregnant).  For now advance diet as tolerated, NPO after MN for EGD tomorrow with MAC.   Rob Bunting, MD [facility] Pager [phone]

## 2013-12-11 NOTE — Progress Notes (Signed)
[facility] PROGRESS NOTE  [name] PaulsJessica [name] WUJ:[id number] DOB: 06-28-1981 DOA: 12/10/2013 PCP: PROVIDER NOT IN SYSTEM  Brief narrative: 33 year old female P3G1, currently at 15 weeks of gestation, H.pylori infection who [facility] 12/10/2013 with intractable nausea and vomiting. Pt is status post cholecystectomy 11/21/2013 but her symptoms did not significantly improve. Pain is sharp and epigastric radiating to the sides. MRCP and abdominal US are unremarkable. Pt is scheduled for EGD tomorrow am.   Assessment and Plan:  Principal Problem:   Abdominal pain, nausea and vomiting - possible PUD - unremarkable MRCP and abdominal US - diet advanced to as tolerated until midnight for EGD for further evaluation - appreciate GI following - will continue IV fluids; minimize pain meds as much as possible because she is pregnant and this is wanted pregnancy so to minimize adverse effects   Code Status: full code Family Communication: family at the bedside Disposition Plan: home when stable  Manson PasseyEVINE, Antwon Rochin, MD  [facility] Pager [phone]  If 7PM-7AM, please contact night-coverage [email or web address] Password [email or web address] 12/11/2013, 4:19 PM   LOS: 1 day   Consultants:  GI (Dr. Christella HartiganJacobs)  Procedures:  MRCP  Antibiotics:  None   HPI/Subjective: No overnight events.  Objective: Filed Vitals:   12/10/13 1603 12/10/13 1734 12/10/13 2250 12/11/13 0623  BP: 105/55 104/59 102/58 96/53  Pulse: 84 76 76 72  Temp:  97.9 F (36.6 C) 97.7 F (36.5 C) 98.5 F (36.9 C)  TempSrc:  Oral Oral Oral  Resp: 16 16 16 16   Height:  5\' 6"  (1.676 m)    Weight:  71.668 kg (158 lb)    SpO2: 96% 98% 98% 100%    Intake/Output Summary (Last 24 hours) at 12/11/13 1619 Last data filed at 12/11/13 [id number]  Gross per 24 hour  Intake    240 ml  Output    250 ml  Net    -10 ml    Exam:   General:  Pt is alert, follows commands appropriately, not in acute distress  Cardiovascular: Regular rate  and rhythm, S1/S2, no murmurs, no rubs, no gallops  Respiratory: Clear to auscultation bilaterally, no wheezing, no crackles, no rhonchi  Abdomen: Stender in mid abdomen, non distended, bowel sounds present, no guarding  Extremities: No edema, pulses DP and PT palpable bilaterally  Neuro: Grossly nonfocal  Data Reviewed: Basic Metabolic Panel:  Recent Labs Lab 12/06/13 1506 12/10/13 0921 12/11/13 0443  NA 137 135* 136*  K 3.7 4.1 3.8  CL 104 98 102  CO2 26 24 24   GLUCOSE 82 89 82  BUN 7 7 4*  CREATININE 0.6 0.49* 0.47*  CALCIUM 9.8 9.7 8.7   Liver Function Tests:  Recent Labs Lab 12/06/13 1506 12/10/13 0921  AST 25 18  ALT 53* 28  ALKPHOS 98 102  BILITOT 0.1* 0.3  PROT 6.5 6.9  ALBUMIN 3.5 3.5    Recent Labs Lab 12/10/13 1530  LIPASE 27   No results found for this basename: AMMONIA,  in the last 168 hours CBC:  Recent Labs Lab 12/06/13 1506 12/10/13 0921  WBC 14.3* 13.6*  NEUTROABS 9.0*  --   HGB 12.0 13.4  HCT 36.0 38.9  MCV 87.0 85.3  PLT 321.0 319   Cardiac Enzymes: No results found for this basename: CKTOTAL, CKMB, CKMBINDEX, TROPONINI,  in the last 168 hours BNP: No components found with this basename: POCBNP,  CBG: No results found for this basename: GLUCAP,  in the last 168 hours  Recent Results (from  the past 240 hour(s))  URINE CULTURE     Status: None   Collection Time    12/02/13  7:28 PM      Result Value Ref Range Status   Specimen Description URINE, CATHETERIZED   Final   Special Requests NONE   Final   Culture  Setup Time     Final   Value: 12/03/2013 04:08     Performed at [facility]   Colony Count     Final   Value: NO GROWTH     Performed at [facility]   Culture     Final   Value: NO GROWTH     Performed at [facility]   Report Status 12/04/2013 FINAL   Final     Studies: US Abdomen Complete 12/10/2013    IMPRESSION: Cholecystectomy. No acute abnormality. Suboptimal visualization of  viscera due to overlying bowel gas in the upper abdomen.     Mr Abdomen Mrcp Wo Cm 12/10/2013    IMPRESSION: 1. Normal common bile duct without evidence of filling defect or compression. No biliary duct dilatation. 2. Normal pancreas without evidence of inflammation duct dilatation. 3. Small amount of fluid fluid in the gallbladder fossa following cholecystectomy. No organized fluid collection, abscess, or biloma.   Mr 3d Recon At Scanner 12/10/2013   IMPRESSION: 1. Normal common bile duct without evidence of filling defect or compression. No biliary duct dilatation. 2. Normal pancreas without evidence of inflammation duct dilatation. 3. Small amount of fluid fluid in the gallbladder fossa following cholecystectomy. No organized fluid collection, abscess, or biloma.    Scheduled Meds: . famotidine (PEPCID) IV  20 mg Intravenous Q12H  . feeding supplement (RESOURCE BREEZE)  1 Container Oral TID WC  . pantoprazole  40 mg Oral BID  . prenatal multivitamin  1 tablet Oral Q1200  . sucralfate  1 g Oral TID WC & HS   Continuous Infusions: . sodium chloride 100 mL/hr at 12/11/13 0437

## 2013-12-11 NOTE — Progress Notes (Signed)
INITIAL NUTRITION ASSESSMENT  DOCUMENTATION CODES Per approved criteria  -Not Applicable   INTERVENTION: -Recommend Resource Breeze po TID, each supplement provides 250 kcal and 9 grams of protein -Modify to Ensure Complete po BID, each supplement provides 350 kcal and 13 grams of protein as tolerated -Diet advancement per MD -Will continue to monitor  NUTRITION DIAGNOSIS: Inadequate oral intake  related to nausea/vomiting as evidenced by poor PO intake 3 weeks, unintentional wt loss.   Goal: Pt to meet >/= 90% of their estimated nutrition needs    Monitor:  Pt to meet >/= 90% of their estimated nutrition needs    Reason for Assessment: MST  33 y.o. female  Admitting Dx: Abdominal pain  ASSESSMENT: 33 year old female who is [redacted] weeks pregnant ( G2P1) who has ongoing nausea and vomiting since November last year, was found to have H. pylori and was treated for that but did not improve her symptoms.[facility] with worsened abdominal pain with nausea and vomiting since last night. Her vitals were stable. Blood work done showed leukocytosis with WBC of 13.6 thousand with normal chemistry.  -Pt reported decreased appetite since 3/12 r/t nausea/vomiting, 2-3 episodes/day -Diet has consisted largely of liquids and bland foods. Has had to force foods as pt aware of importance of nutrition during pregnancy.  -Can tolerate toast, crackers, and some chicken for protein Ate clear liquid tray this morning, which pt reported to be able to tolerated -Endorsed a 3 lb wt loss in 3 weeks -Recommend pt try [employer]the[employer]ource Breeze as she may be able to tolerate vs Ensure.  Modify to Ensure Complete as tolerated to meet estimated nutrition needs for pregnancy -Pt to undergo EGD this morning  Height: Ht Readings from Last 1 Encounters:  12/10/13 5\' 6"  (1.676 m)    Weight: Wt Readings from Last 1 Encounters:  12/10/13 158 lb (71.668 kg)    Ideal Body Weight:  130 lbs  % Ideal Body Weight: 122%  Wt Readings from Last 10 Encounters:  12/10/13 158 lb (71.668 kg)  12/09/13 159 lb 9.6 oz (72.394 kg)  12/06/13 160 lb 6.4 oz (72.757 kg)  12/04/13 158 lb 12.8 oz (72.031 kg)  11/26/13 160 lb (72.576 kg)  11/20/13 160 lb (72.576 kg)  11/20/13 160 lb (72.576 kg)  11/06/13 160 lb 8 oz (72.802 kg)  11/03/13 159 lb 9.6 oz (72.394 kg)  10/22/13 162 lb 6.4 oz (73.664 kg)    Usual Body Weight: 161 lbs  % Usual Body Weight: 85%  BMI:  Body mass index is 25.51 kg/(m^2).  Estimated Nutritional Needs: Kcal: 1750-1950 Protein: 75-85 gram Fluid: >/=1800 ml/daily  Skin: WDL  Diet Order: Full Liquid  EDUCATION NEEDS: -Education not appropriate at this time   Intake/Output Summary (Last 24 hours) at 12/11/13 1200 Last data filed at 12/11/13 0625  Gross per 24 hour  Intake    240 ml  Output    250 ml  Net    -10 ml    Last BM: 3/30   Labs:   Recent Labs Lab 12/06/13 1506 12/10/13 0921 12/11/13 0443  NA 137 135* 136*  K 3.7 4.1 3.8  CL 104 98 102  CO2 26 24 24   BUN 7 7 4*  CREATININE 0.6 0.49* 0.47*  CALCIUM 9.8 9.7 8.7  GLUCOSE 82 89 82    CBG (last 3)  No results found for this basename: GLUCAP,  in the last 72 hours  Scheduled Meds: . enoxaparin (LOVENOX) injection  40 mg Subcutaneous Q24H  . famotidine (PEPCID) IV  20 mg Intravenous Q12H  . pantoprazole  40 mg Oral BID  . prenatal multivitamin  1 tablet Oral Q1200  . sucralfate  1 g Oral TID WC & HS    Continuous Infusions: . sodium chloride 100 mL/hr at 12/11/13 4098    Past Medical History  Diagnosis Date  . Narcotic abuse   . Anxiety   . Depression   . Chronic headaches   . H. pylori infection   . UTI (lower urinary tract infection)     Past Surgical History  Procedure Laterality Date  . Abortions    . Wisdom tooth extraction    . Cholecystectomy N/A 11/21/2013    Procedure: LAPAROSCOPIC CHOLECYSTECTOMY ;  Surgeon: Axel Filler, MD;  Location: [facility]  [facility];  Service: General;  Laterality: N/A;    Lloyd Huger MS RD LDN Clinical Dietitian Pager:[phone]

## 2013-12-11 NOTE — Progress Notes (Signed)
UR completed. Patient changed to inpatient- requiring IVF @ 100cc/hr and IV pain medication 

## 2013-12-12 ENCOUNTER — Encounter (HOSPITAL_COMMUNITY)
Admission: AD | Disposition: A | Discharge status: Home / Self Care | Payer: Self-pay | Source: Ambulatory Visit | Attending: Internal Medicine

## 2013-12-12 ENCOUNTER — Inpatient Hospital Stay (HOSPITAL_COMMUNITY): Payer: Medicaid Other | Admitting: Anesthesiology

## 2013-12-12 ENCOUNTER — Encounter (HOSPITAL_COMMUNITY): Payer: Medicaid Other | Admitting: Anesthesiology

## 2013-12-12 ENCOUNTER — Encounter (HOSPITAL_COMMUNITY): Payer: Self-pay | Admitting: Gastroenterology

## 2013-12-12 DIAGNOSIS — K2990 Gastroduodenitis, unspecified, without bleeding: Secondary | ICD-10-CM

## 2013-12-12 DIAGNOSIS — K8020 Calculus of gallbladder without cholecystitis without obstruction: Secondary | ICD-10-CM

## 2013-12-12 DIAGNOSIS — O26619 Liver and biliary tract disorders in pregnancy, unspecified trimester: Secondary | ICD-10-CM

## 2013-12-12 DIAGNOSIS — K2970 Gastritis, unspecified, without bleeding: Secondary | ICD-10-CM

## 2013-12-12 HISTORY — PX: ESOPHAGOGASTRODUODENOSCOPY: SHX5428

## 2013-12-12 SURGERY — EGD (ESOPHAGOGASTRODUODENOSCOPY)
Anesthesia: Monitor Anesthesia Care

## 2013-12-12 MED ORDER — OXYCODONE-ACETAMINOPHEN 5-325 MG PO TABS: 1.0000 | ORAL_TABLET | Freq: Three times a day (TID) | ORAL | Status: DC | PRN

## 2013-12-12 MED ORDER — OXYCODONE-ACETAMINOPHEN 5-325 MG PO TABS
1.0000 | ORAL_TABLET | Freq: Four times a day (QID) | ORAL | Status: DC | PRN
Start: 2013-12-12 — End: 2013-12-12

## 2013-12-12 MED ORDER — FENTANYL CITRATE 0.05 MG/ML IJ SOLN
INTRAMUSCULAR | Status: DC | PRN
  Administered 2013-12-12: 50 ug via INTRAVENOUS

## 2013-12-12 MED ORDER — BUTAMBEN-TETRACAINE-BENZOCAINE 2-2-14 % EX AERO
INHALATION_SPRAY | CUTANEOUS | Status: DC | PRN
  Administered 2013-12-12: 2 via TOPICAL

## 2013-12-12 MED ORDER — PROPOFOL INFUSION 10 MG/ML OPTIME
INTRAVENOUS | Status: DC | PRN
  Administered 2013-12-12: 140 ug/kg/min via INTRAVENOUS

## 2013-12-12 MED ORDER — LACTATED RINGERS IV SOLN
INTRAVENOUS | Status: DC
  Administered 2013-12-12: 09:00:00 via INTRAVENOUS

## 2013-12-12 MED ORDER — HYDROCODONE-ACETAMINOPHEN 5-325 MG PO TABS: 1.0000 | ORAL_TABLET | Freq: Four times a day (QID) | ORAL | Status: DC | PRN

## 2013-12-12 MED ORDER — OXYCODONE-ACETAMINOPHEN 5-325 MG PO TABS: 1.0000 | ORAL_TABLET | Freq: Four times a day (QID) | ORAL | Status: DC | PRN

## 2013-12-12 NOTE — Anesthesia Preprocedure Evaluation (Addendum)
Anesthesia Evaluation  Patient identified by MRN, date of birth, ID band Patient awake    Reviewed: Allergy & Precautions, H&P , NPO status , Patient's Chart, lab work & pertinent test results  Airway Mallampati: II TM Distance: >3 FB Neck ROM: full    Dental no notable dental hx.    Pulmonary neg pulmonary ROS, Current Smoker,  breath sounds clear to auscultation  Pulmonary exam normal       Cardiovascular Exercise Tolerance: Good negative cardio ROS  Rhythm:regular Rate:Normal     Neuro/Psych  Headaches, PSYCHIATRIC DISORDERS Anxiety Depression    GI/Hepatic negative GI ROS, (+)     substance abuse   , Narcotic abuse history   Endo/Other  negative endocrine ROS  Renal/GU negative Renal ROS     Musculoskeletal   Abdominal   Peds  Hematology negative hematology ROS (+)   Anesthesia Other Findings   Reproductive/Obstetrics (+) Pregnancy                          Anesthesia Physical  Anesthesia Plan  ASA: II  Anesthesia Plan: MAC   Post-op Pain Management:    Induction: Intravenous  Airway Management Planned:   Additional Equipment:   Intra-op Plan:   Post-operative Plan:   Informed Consent: I have reviewed the patients History and Physical, chart, labs and discussed the procedure including the risks, benefits and alternatives for the proposed anesthesia with the patient or authorized representative who has indicated his/her understanding and acceptance.   Dental advisory given  Plan Discussed with: CRNA  Anesthesia Plan Comments:         Anesthesia Quick Evaluation

## 2013-12-12 NOTE — Progress Notes (Signed)
[facility] PROGRESS NOTE  [name] [name] ZOX:[id number] DOB: [birth year] DOA: 12/10/2013 PCP: PROVIDER NOT IN SYSTEM  Brief narrative: 33 year old female P3G1, currently at 15 weeks of gestation, H.pylori infection who presented to [facility] ED 12/10/2013 with intractable nausea and vomiting. Pt is status post cholecystectomy 11/21/2013 but her symptoms did not significantly improve. Pain is sharp and epigastric radiating to the sides. MRCP and abdominal US are unremarkable. EGD showed gastritis.  Assessment and Plan:   Principal Problem:  Abdominal pain, nausea and vomiting  - possible PUD; gastritis; had EGD with evidence of gastritis, biopsies taken - unremarkable MRCP and abdominal US  - diet advanced to as tolerated - appreciate GI following  - will continue IV fluids; minimize pain meds as much as possible  Code Status: full code  Family Communication: family at the bedside  Disposition Plan: home when stable   Consultants:  GI (Dr. Christella Hartigan) Procedures:  MRCP Antibiotics:  None   Manson Passey, MD  [facility] Pager [phone]  If 7PM-7AM, please contact night-coverage [email or web address] Password [email or web address] 12/12/2013, 2:42 PM   LOS: 2 days    HPI/Subjective: Has abdominal pain, feels tired.   Objective: Filed Vitals:   12/12/13 1010 12/12/13 1020 12/12/13 1030 12/12/13 1040  BP: 94/55 94/55 100/54 100/54  Pulse:      Temp:      TempSrc:      Resp: 15 11 11    Height:      Weight:      SpO2: 98% 96% 98%     Intake/Output Summary (Last 24 hours) at 12/12/13 1442 Last data filed at 12/12/13 1004  Gross per 24 hour  Intake 3891.66 ml  Output   1500 ml  Net 2391.66 ml    Exam:   General:  Pt is alert, follows commands appropriately, not in acute distress  Cardiovascular: Regular rate and rhythm, S1/S2, no murmurs, no rubs, no gallops  Respiratory: Clear to auscultation bilaterally, no wheezing, no crackles, no rhonchi  Abdomen: tender across mid abdomen,  non distended, bowel sounds present, no guarding  Extremities: No edema, pulses DP and PT palpable bilaterally  Neuro: Grossly nonfocal  Data Reviewed: Basic Metabolic Panel:  Recent Labs Lab 12/06/13 1506 12/10/13 0921 12/11/13 0443  NA 137 135* 136*  K 3.7 4.1 3.8  CL 104 98 102  CO2 26 24 24   GLUCOSE 82 89 82  BUN 7 7 4*  CREATININE 0.6 0.49* 0.47*  CALCIUM 9.8 9.7 8.7   Liver Function Tests:  Recent Labs Lab 12/06/13 1506 12/10/13 0921  AST 25 18  ALT 53* 28  ALKPHOS 98 102  BILITOT 0.1* 0.3  PROT 6.5 6.9  ALBUMIN 3.5 3.5    Recent Labs Lab 12/10/13 1530  LIPASE 27   No results found for this basename: AMMONIA,  in the last 168 hours CBC:  Recent Labs Lab 12/06/13 1506 12/10/13 0921  WBC 14.3* 13.6*  NEUTROABS 9.0*  --   HGB 12.0 13.4  HCT 36.0 38.9  MCV 87.0 85.3  PLT 321.0 319   Cardiac Enzymes: No results found for this basename: CKTOTAL, CKMB, CKMBINDEX, TROPONINI,  in the last 168 hours BNP: No components found with this basename: POCBNP,  CBG: No results found for this basename: GLUCAP,  in the last 168 hours  Recent Results (from the past 240 hour(s))  URINE CULTURE     Status: None   Collection Time    12/02/13  7:28 PM      Result Value Ref  Range Status   Specimen Description URINE, CATHETERIZED   Final   Special Requests NONE   Final   Culture  Setup Time     Final   Value: 12/03/2013 04:08     [facility]formed at [facility]   Colony Count     Final   Value: NO GROWTH     [facility]formed at [facility]   Culture     Final   Value: NO GROWTH     Performed at [facility]   Report Status 12/04/2013 FINAL   Final     Studies: Mr Abdomen Mrcp Wo Cm  12/10/2013   CLINICAL DATA:  Patient [redacted] weeks pregnant with gallbladder removed on 11/24/2013. Nausea and vomiting persists after gallbladder removal.  EXAM: MRI ABDOMEN WITHOUT  (INCLUDING MRCP)  TECHNIQUE: Multiplanar multisequence MR imaging of the abdomen was  performed. Heavily T2-weighted images of the biliary and pancreatic ducts were obtained, and three-dimensional MRCP images were rendered by post processing.  COMPARISON:  CT ABD/PELVIS W CM dated 07/21/2013  FINDINGS: There is no pleural fluid or pericardial fluid.  No intrahepatic or extrahepatic biliary duct dilatation. No focal hepatic lesion. The common hepatic duct and common bile duct are normal caliber. The no filling defects within the common bile duct. No external compression. The common bile duct measures 4 mm. The pancreatic duct likewise is normal caliber.  Small amount of fluid within the gallbladder fossa site of surgery. No evidence of biloma or abscess. There is artifact in the gallbladder fossa from the cholecystectomy clips. As well as gas in the duodenum.  Spleen, adrenal glands, kidneys are normal. Stomach and limited view of the small bowel and colon are unremarkable. Abdominal lymphadenopathy. No aggressive osseous lesion.  IMPRESSION: 1. Normal common bile duct without evidence of filling defect or compression. No biliary duct dilatation. 2. Normal pancreas without evidence of inflammation duct dilatation. 3. Small amount of fluid fluid in the gallbladder fossa following cholecystectomy. No organized fluid collection, abscess, or biloma.   Electronically Signed   By: Genevive BiStewart  Edmunds M.D.   On: 12/10/2013 19:32   Mr 3d Recon At Scanner  12/10/2013   CLINICAL DATA:  Patient [redacted] weeks pregnant with gallbladder removed on 11/24/2013. Nausea and vomiting persists after gallbladder removal.  EXAM: MRI ABDOMEN WITHOUT  (INCLUDING MRCP)  TECHNIQUE: Multiplanar multisequence MR imaging of the abdomen was performed. Heavily T2-weighted images of the biliary and pancreatic ducts were obtained, and three-dimensional MRCP images were rendered by post processing.  COMPARISON:  CT ABD/PELVIS W CM dated 07/21/2013  FINDINGS: There is no pleural fluid or pericardial fluid.  No intrahepatic or extrahepatic  biliary duct dilatation. No focal hepatic lesion. The common hepatic duct and common bile duct are normal caliber. The no filling defects within the common bile duct. No external compression. The common bile duct measures 4 mm. The pancreatic duct likewise is normal caliber.  Small amount of fluid within the gallbladder fossa site of surgery. No evidence of biloma or abscess. There is artifact in the gallbladder fossa from the cholecystectomy clips. As well as gas in the duodenum.  Spleen, adrenal glands, kidneys are normal. Stomach and limited view of the small bowel and colon are unremarkable. Abdominal lymphadenopathy. No aggressive osseous lesion.  IMPRESSION: 1. Normal common bile duct without evidence of filling defect or compression. No biliary duct dilatation. 2. Normal pancreas without evidence of inflammation duct dilatation. 3. Small amount of fluid fluid in the gallbladder fossa following cholecystectomy.  No organized fluid collection, abscess, or biloma.   Electronically Signed   By: Genevive Bi M.D.   On: 12/10/2013 19:32    Scheduled Meds: . famotidine (PEPCID) IV  20 mg Intravenous Q12H  . feeding supplement (RESOURCE BREEZE)  1 Container Oral TID WC  . pantoprazole  40 mg Oral BID  . prenatal multivitamin  1 tablet Oral Q1200  . sucralfate  1 g Oral TID WC & HS   Continuous Infusions: . sodium chloride 100 mL/hr at 12/12/13 1430

## 2013-12-12 NOTE — H&P (View-Only) (Signed)
   Consultation  Referring Provider: [facility]     Primary Care Physician:  PROVIDER NOT IN SYSTEM Primary Gastroenterologist:  Carl Gessner, MD      Reason for Consultation:  Abdominal pain            HPI:   [name] [name] is a 32 y.o. female known to Dr. Gessner in our office. In November pain began having epigastric pain, nausea, and vomiting. She was found to have gallstones and ultrasound 3/11/5 suggested acute cholecystitis. She underwent cholecystectomy 11/21/13 without resolution of symptoms. Patient is now 14-[redacted] weeks pregnant. Epigastric pain described as burning. It is refractory to BID PPI. She cannot related the pain to anything.  Patient was seen in our office 12/06/13 and scheduled for ultrasound. She came back to ED yesterday for the pain. Ultrasound unremarkable. She does have an elevated WBC in 13-14 range. LFTs and lipase normal.   Past Medical History  Diagnosis Date  . Narcotic abuse   . Anxiety   . Depression   . Chronic headaches   . H. pylori infection   . UTI (lower urinary tract infection)     Past Surgical History  Procedure Laterality Date  . Abortions    . Wisdom tooth extraction    . Cholecystectomy N/A 11/21/2013    Procedure: LAPAROSCOPIC CHOLECYSTECTOMY ;  Surgeon: Armando Ramirez, MD;  Location: [facility];  Service: General;  Laterality: N/A;    Family History  Problem Relation Age of Onset  . Diabetes Mother   . Cancer Maternal Aunt   . Cancer Maternal Uncle   . Cancer Paternal Aunt   . Cancer Paternal Uncle      History  Substance Use Topics  . Smoking status: Current Every Day Smoker -- 1.00 packs/day for 15 years    Types: Cigarettes  . Smokeless tobacco: Never Used     Comment: Patient has cut back to 1 pack daily from 2 packs daily  . Alcohol Use: No     Comment: recovering addict    Prior to Admission medications   Medication Sig Start Date End Date Taking? Authorizing Provider  acetaminophen (TYLENOL) 325 MG tablet Take  650 mg by mouth daily as needed for moderate pain.   Yes Historical Provider, MD  omeprazole (PRILOSEC) 40 MG capsule Take 1 capsule (40 mg total) by mouth 2 (two) times daily. 12/06/13  Yes Mikki D. Zehr, PA-C  oxyCODONE-acetaminophen (ROXICET) 5-325 MG per tablet Take 1-2 tablets by mouth every 6 (six) hours as needed for severe pain. 12/02/13  Yes Abigail Harris, PA-C  Prenatal Vit-Fe Fumarate-FA (PRENATAL MULTIVITAMIN) TABS tablet Take 1 tablet by mouth daily at 12 noon.   Yes Historical Provider, MD  promethazine (PHENERGAN) 12.5 MG tablet Take 1 tablet (12.5 mg total) by mouth every 6 (six) hours as needed for nausea or vomiting. 11/06/13  Yes Lisa A Leftwich-Kirby, CNM  sucralfate (CARAFATE) 1 G tablet Take 1 tablet (1 g total) by mouth 4 (four) times daily -  with meals and at bedtime. 12/02/13  Yes Abigail Harris, PA-C    Current Facility-Administered Medications  Medication Dose Route Frequency Provider Last Rate Last Dose  . 0.9 %  sodium chloride infusion   Intravenous Continuous Nishant Dhungel, MD 100 mL/hr at 12/11/13 0437    . acetaminophen (TYLENOL) tablet 650 mg  650 mg Oral Daily PRN Nishant Dhungel, MD   650 mg at 12/10/13 1939  . enoxaparin (LOVENOX) injection 40 mg  40 mg Subcutaneous Q24H   Nishant Dhungel, MD      . famotidine (PEPCID) IVPB 20 mg  20 mg Intravenous Q12H Nishant Dhungel, MD   20 mg at 12/10/13 2121  . HYDROmorphone (DILAUDID) injection 2 mg  2 mg Intravenous Q4H PRN Nishant Dhungel, MD      . metoCLOPramide (REGLAN) injection 10 mg  10 mg Intravenous Q6H PRN Nishant Dhungel, MD   10 mg at 12/11/13 0643  . ondansetron (ZOFRAN) tablet 4 mg  4 mg Oral Q4H PRN Nishant Dhungel, MD       Or  . ondansetron (ZOFRAN) injection 4 mg  4 mg Intravenous Q4H PRN Nishant Dhungel, MD      . oxyCODONE-acetaminophen (PERCOCET/ROXICET) 5-325 MG per tablet 1-2 tablet  1-2 tablet Oral Q6H PRN Nishant Dhungel, MD   2 tablet at 12/11/13 0643  . pantoprazole (PROTONIX) EC tablet 40 mg   40 mg Oral BID Nishant Dhungel, MD   40 mg at 12/10/13 2126  . prenatal multivitamin tablet 1 tablet  1 tablet Oral Q1200 Nishant Dhungel, MD      . sucralfate (CARAFATE) tablet 1 g  1 g Oral TID WC & HS Nishant Dhungel, MD   1 g at 12/11/13 0759    Allergies as of 12/10/2013  . (No Known Allergies)    Review of Systems:    All systems reviewed and negative except where noted in HPI.   Physical Exam:  Vital signs in last 24 hours: Temp:  [97.7 F (36.5 C)-98.5 F (36.9 C)] 98.5 F (36.9 C) (04/01 0623) Pulse Rate:  [72-91] 72 (04/01 0623) Resp:  [16-18] 16 (04/01 0623) BP: (96-107)/(53-65) 96/53 mmHg (04/01 0623) SpO2:  [96 %-100 %] 100 % (04/01 0623) Weight:  [158 lb (71.668 kg)] 158 lb (71.668 kg) (03/31 1734) Last BM Date: 12/09/13 General:   Pleasant white female in NAD Head:  Normocephalic and atraumatic. Eyes:   No icterus.   Conjunctiva pink. Ears:  Normal auditory acuity. Neck:  Supple; no masses felt Lungs:  Respirations even and unlabored. Lungs clear to auscultation bilaterally.  No wheezes, crackles, or rhonchi.  Heart:  Regular rate and rhythm;  murmur heard. Abdomen:  Soft, nondistended, nontender. Normal bowel sounds. No appreciable masses or hepatomegaly.  Rectal:  Not performed.  Msk:  Symmetrical without gross deformities.  Extremities:  Without edema. Neurologic:  Alert and  oriented x4;  grossly normal neurologically. Skin:  Intact without significant lesions or rashes. Cervical Nodes:  No significant cervical adenopathy. Psych:  Alert and cooperative. Normal affect.  LAB RESULTS:  Recent Labs  12/10/13 0921  WBC 13.6*  HGB 13.4  HCT 38.9  PLT 319   BMET  Recent Labs  12/10/13 0921 12/11/13 0443  NA 135* 136*  K 4.1 3.8  CL 98 102  CO2 24 24  GLUCOSE 89 82  BUN 7 4*  CREATININE 0.49* 0.47*  CALCIUM 9.7 8.7   LFT  Recent Labs  12/10/13 0921  PROT 6.9  ALBUMIN 3.5  AST 18  ALT 28  ALKPHOS 102  BILITOT 0.3    STUDIES: Us  Abdomen Complete  12/10/2013   CLINICAL DATA:  Epigastric pain. Cholecystectomy 11/21/2013. Fifteen weeks pregnant.  EXAM: ULTRASOUND ABDOMEN COMPLETE  COMPARISON:  None.  FINDINGS: Gallbladder:  Surgically absent.  Common bile duct:  Diameter: 4 mm, normal.  Liver:  No focal lesion identified. Within normal limits in parenchymal echogenicity.  IVC:  Suboptimally visualized due to pregnancy.  Pancreas:  Suboptimally visualized.  Spleen:  5.2   cm.  Normal.  Right Kidney:  Length: 12 cm. Echogenicity within normal limits. No mass or hydronephrosis visualized.  Left Kidney:  Length: 11.6 cm. Echogenicity within normal limits. No mass or hydronephrosis visualized.  Abdominal aorta:  No aneurysm visualized.  Other findings:  None.  IMPRESSION: Cholecystectomy. No acute abnormality. Suboptimal visualization of viscera due to overlying bowel gas in the upper abdomen.   Electronically Signed   By: Geoffrey  Lamke M.D.   On: 12/10/2013 10:48   Mr Abdomen Mrcp Wo Cm  12/10/2013   CLINICAL DATA:  Patient [redacted] weeks pregnant with gallbladder removed on 11/24/2013. Nausea and vomiting persists after gallbladder removal.  EXAM: MRI ABDOMEN WITHOUT  (INCLUDING MRCP)  TECHNIQUE: Multiplanar multisequence MR imaging of the abdomen was performed. Heavily T2-weighted images of the biliary and pancreatic ducts were obtained, and three-dimensional MRCP images were rendered by post processing.  COMPARISON:  CT ABD/PELVIS W CM dated 07/21/2013  FINDINGS: There is no pleural fluid or pericardial fluid.  No intrahepatic or extrahepatic biliary duct dilatation. No focal hepatic lesion. The common hepatic duct and common bile duct are normal caliber. The no filling defects within the common bile duct. No external compression. The common bile duct measures 4 mm. The pancreatic duct likewise is normal caliber.  Small amount of fluid within the gallbladder fossa site of surgery. No evidence of biloma or abscess. There is artifact in the  gallbladder fossa from the cholecystectomy clips. As well as gas in the duodenum.  Spleen, adrenal glands, kidneys are normal. Stomach and limited view of the small bowel and colon are unremarkable. Abdominal lymphadenopathy. No aggressive osseous lesion.  IMPRESSION: 1. Normal common bile duct without evidence of filling defect or compression. No biliary duct dilatation. 2. Normal pancreas without evidence of inflammation duct dilatation. 3. Small amount of fluid fluid in the gallbladder fossa following cholecystectomy. No organized fluid collection, abscess, or biloma.   Electronically Signed   By: Stewart  Edmunds M.D.   On: 12/10/2013 19:32   Mr 3d Recon At Scanner  12/10/2013   CLINICAL DATA:  Patient [redacted] weeks pregnant with gallbladder removed on 11/24/2013. Nausea and vomiting persists after gallbladder removal.  EXAM: MRI ABDOMEN WITHOUT  (INCLUDING MRCP)  TECHNIQUE: Multiplanar multisequence MR imaging of the abdomen was performed. Heavily T2-weighted images of the biliary and pancreatic ducts were obtained, and three-dimensional MRCP images were rendered by post processing.  COMPARISON:  CT ABD/PELVIS W CM dated 07/21/2013  FINDINGS: There is no pleural fluid or pericardial fluid.  No intrahepatic or extrahepatic biliary duct dilatation. No focal hepatic lesion. The common hepatic duct and common bile duct are normal caliber. The no filling defects within the common bile duct. No external compression. The common bile duct measures 4 mm. The pancreatic duct likewise is normal caliber.  Small amount of fluid within the gallbladder fossa site of surgery. No evidence of biloma or abscess. There is artifact in the gallbladder fossa from the cholecystectomy clips. As well as gas in the duodenum.  Spleen, adrenal glands, kidneys are normal. Stomach and limited view of the small bowel and colon are unremarkable. Abdominal lymphadenopathy. No aggressive osseous lesion.  IMPRESSION: 1. Normal common bile duct  without evidence of filling defect or compression. No biliary duct dilatation. 2. Normal pancreas without evidence of inflammation duct dilatation. 3. Small amount of fluid fluid in the gallbladder fossa following cholecystectomy. No organized fluid collection, abscess, or biloma.   Electronically Signed   By: Stewart  Edmunds M.D.     On: 12/10/2013 19:32   PREVIOUS ENDOSCOPIES:            none   Impression / Plan:   1. Epigastric pain, nausea and vomiting since November despite cholecystectomy for cholelithiasis mid March. Ultrasound and MRCP unrevealing. For further evaluation patient will be scheduled for EGD. The benefits, risks, and potential complications of EGD with possible biopsies were discussed with the patient and she agrees to proceed.   2. [redacted] weeks gestation.   Thanks   LOS: 1 day   Paula Guenther  12/11/2013, 9:36 AM   ________________________________________________________________________  [facility] MD note:  I personally examined the patient, reviewed the data and agree with the assessment and plan described above.  Previous very high NSAID use (before stopping in November she was taking 6 ibuprofen daily), was treated for H. Pylori + serology back then as well.  Currently with epig pain, vomiting.  Removal of GB did not help her pains.  LFTS normal MRCP normal.  Perhaps this is PUD.  I would like to proceed with EGD, was thinking to do it today however anesthesia assistance is not possible until tomorrow (prefer anesthesia to help since she is pregnant).  For now advance diet as tolerated, NPO after MN for EGD tomorrow with MAC.   Daniel Jacobs, MD [facility] Pager [phone]   

## 2013-12-12 NOTE — Transfer of Care (Signed)
Immediate Anesthesia Transfer of Care Note  Patient: [name] [name]  Procedure(s) Performed: Procedure(s): ESOPHAGOGASTRODUODENOSCOPY (EGD) (N/A)  Patient Location: PACU  Anesthesia Type:MAC  Level of Consciousness: awake, alert  and oriented  Airway & Oxygen Therapy: Patient Spontanous Breathing and Patient connected to nasal cannula oxygen  Post-op Assessment: Report given to PACU RN and Post -op Vital signs reviewed and stable  Post vital signs: Reviewed and stable  Complications: No apparent anesthesia complications

## 2013-12-12 NOTE — Anesthesia Postprocedure Evaluation (Signed)
Anesthesia Post Note  Patient: [name] PaulsJessica [name]  Procedure(s) Performed: Procedure(s) (LRB): ESOPHAGOGASTRODUODENOSCOPY (EGD) (N/A)  Anesthesia type: MAC  Patient location: PACU  Post pain: Pain level controlled  Post assessment: Post-op Vital signs reviewed  Last Vitals: BP 100/54  Pulse 83  Temp(Src) 36.5 C (Oral)  Resp 11  Ht 5\' 6"  (1.676 m)  Wt 158 lb (71.668 kg)  BMI 25.51 kg/m2  SpO2 98%  LMP 08/27/2013  Post vital signs: Reviewed  Level of consciousness: awake  Complications: No apparent anesthesia complications

## 2013-12-12 NOTE — Progress Notes (Signed)
Pt with increased nausea w/o any relief with meds. [facility] paged awaiting call back.

## 2013-12-12 NOTE — Care Management Note (Signed)
   CARE MANAGEMENT NOTE 12/12/2013  Patient:  [name] [name],[name] [name]   Account Number:  [id number]  Date Initiated:  12/12/2013  Documentation initiated by:  Jilliane Kazanjian  Subjective/Objective Assessment:   33 yo female admitted with abdominal pain. Pt is pregnant.Primary Gastroenterologist:  Stan Headarl Gessner, MD     Action/Plan:   Home when stable   Anticipated DC Date:     Anticipated DC Plan:  HOME/SELF CARE      DC Planning Services  CM consult      Choice offered to / List presented to:  NA   DME arranged  NA      DME agency  NA     HH arranged  NA      HH agency  NA   Status of service:  Completed, signed off Medicare Important Message given?   (If response is "NO", the following Medicare IM given date fields will be blank) Date Medicare IM given:   Date Additional Medicare IM given:    Discharge Disposition:    Per UR Regulation:  Reviewed for med. necessity/level of care/duration of stay  If discussed at Long Length of Stay Meetings, dates discussed:    Comments:  12/12/13 1126 Akili Cuda,MSN,RN [phone] chart reviewed for utilization of srvices. No needs assessed this time. Identified PCP and Pharmacy.

## 2013-12-12 NOTE — Interval H&P Note (Signed)
History and Physical Interval Note:  12/12/2013 9:20 AM  [name]  has presented today for surgery, with the diagnosis of epigastric pain, nausea and vomiting  The various methods of treatment have been discussed with the patient and family. After consideration of risks, benefits and other options for treatment, the patient has consented to  Procedure(s): ESOPHAGOGASTRODUODENOSCOPY (EGD) (N/A) as a surgical intervention .  The patient's history has been reviewed, patient examined, no change in status, stable for surgery.  I have reviewed the patient's chart and labs.  Questions were answered to the patient's satisfaction.     Rachael FeeJacobs, Daniel P

## 2013-12-12 NOTE — Op Note (Signed)
[facility] [address] [place], [id number]   ENDOSCOPY PROCEDURE REPORT  PATIENT: Rocco [name], [name]  MR#: [id number] BIRTHDATE: 1981/01/03 , 32  yrs. old GENDER: Female ENDOSCOPIST: Rachael Feeaniel P Jacobs, MD PROCEDURE DATE:  12/12/2013 PROCEDURE:  EGD w/ biopsy ASA CLASS:     Class II INDICATIONS:  epigastric pain. MEDICATIONS: MAC sedation, administered by CRNA TOPICAL ANESTHETIC: none  DESCRIPTION OF PROCEDURE: After the risks benefits and alternatives of the procedure were thoroughly explained, informed consent was obtained.  The PENTAX GASTOROSCOPE [id number] endoscope was introduced through the mouth and advanced to the second portion of the duodenum. Without limitations.  The instrument was slowly withdrawn as the mucosa was fully examined.    There was mild, non-specific distal gastritis.  This was biopsied and sent to pathology.  The examination was otherwise normal. Retroflexed views revealed no abnormalities.     The scope was then withdrawn from the patient and the procedure completed. COMPLICATIONS: There were no complications.  ENDOSCOPIC IMPRESSION: There was mild, non-specific distal gastritis.  This was biopsied and sent to pathology. The examination was otherwise normal.  RECOMMENDATIONS: Treat her abd pain and nausea/vomiting empirically.  I will add carafate to her med list, 1gram three times daily.  If biopsies show H.  pylori, I will start appropriate antibiotics.  She should continue to refrain from NSAID use.  OK to d/c when tolerating regular diet.   eSigned:  Rachael Feeaniel P Jacobs, MD 12/12/2013 10:00 AM   CC: Stan Headarl Gessner, MD

## 2013-12-13 ENCOUNTER — Encounter: Payer: Self-pay | Admitting: Obstetrics & Gynecology

## 2013-12-13 DIAGNOSIS — Z331 Pregnant state, incidental: Secondary | ICD-10-CM

## 2013-12-13 MED ORDER — OXYCODONE-ACETAMINOPHEN 5-325 MG PO TABS: 1.0000 | ORAL_TABLET | ORAL | Status: DC | PRN

## 2013-12-13 MED ORDER — DOCUSATE SODIUM 100 MG PO CAPS
100.0000 mg | ORAL_CAPSULE | Freq: Two times a day (BID) | ORAL | Status: DC
  Administered 2013-12-13: 100 mg via ORAL
  Filled 2013-12-13 (×2): qty 1

## 2013-12-13 MED ORDER — BISACODYL 10 MG RE SUPP
10.0000 mg | Freq: Every day | RECTAL | Status: DC
Start: 2013-12-13 — End: 2013-12-13
  Administered 2013-12-13: 10 mg via RECTAL
  Filled 2013-12-13: qty 1

## 2013-12-13 MED ORDER — DSS 100 MG PO CAPS: 100.0000 mg | ORAL_CAPSULE | Freq: Two times a day (BID) | ORAL | Status: DC | PRN

## 2013-12-13 MED ORDER — POLYETHYLENE GLYCOL 3350 17 G PO PACK
17.0000 g | PACK | Freq: Two times a day (BID) | ORAL | Status: DC
  Administered 2013-12-13: 17 g via ORAL
  Filled 2013-12-13 (×2): qty 1

## 2013-12-13 MED ORDER — SUCRALFATE 1 G PO TABS: 1.0000 g | ORAL_TABLET | Freq: Three times a day (TID) | ORAL | Status: DC

## 2013-12-13 MED ORDER — POLYETHYLENE GLYCOL 3350 17 G PO PACK: 17.0000 g | PACK | Freq: Every day | ORAL | Status: DC | PRN

## 2013-12-13 NOTE — Discharge Summary (Signed)
Physician Discharge Summary  [name] [name] WGN:[id number] DOB: 02/16/1981 DOA: 12/10/2013  PCP: PROVIDER NOT IN SYSTEM  Admit date: 12/10/2013 Discharge date: 12/13/2013  Recommendations for Outpatient Follow-up:  Appt scheduled with OB GYN for Tuesday 4/7 at 9 am.  I spoke with OBGYN on call, Surgery (Dr. Maisie Fus) and GI (Dr. Christella Hartigan) who all agree pt is medically stable for discharge and no further work up is warranted. The pt keeps asking for IV dilaudid which I did not feel comfortable prescribing due to her pregnancy. I was instructed by her OB GYN to have her follow up outpt with them and give her enough pain medication to last till Tuesday.  I have also instructed the patient to seek another opinion as she is adamant we have not done enough of the work up to figure out her pain. OBGYN did not recommend CT due to pregnancy and risk of radiation.   Discharge Diagnoses:  Principal Problem:   Abdominal pain Active Problems:   Chronic cholecystitis with calculus   Abdominal pain, epigastric   Nausea and vomiting   Tobacco abuse   Anxiety   Depression   Nausea and vomiting in pregnancy   Unspecified gastritis and gastroduodenitis without mention of hemorrhage    Discharge Condition: medically stable for discharge home today   Diet recommendation: as tolerated   History of present illness:  33 year old female P3G1, currently at 15 weeks of gestation, H.pylori infection who presented to [facility] ED 12/10/2013 with intractable nausea and vomiting. Pt is status post cholecystectomy 11/21/2013 but her symptoms did not significantly improve. Pain is sharp and epigastric radiating to the sides. MRCP and abdominal US are unremarkable. EGD showed gastritis.   Assessment and Plan:   Principal Problem:  Abdominal pain, nausea and vomiting  - possible PUD; gastritis; had EGD with evidence of gastritis, biopsies taken and no evidence of malignancy or H.Pylori - unremarkable MRCP and abdominal US  -  diet advanced to as tolerated  - appreciate GI following  - pt instructed to follow up with OB outpatient Monday per scheduled appt  Code Status: full code  Family Communication: family at the bedside    Consultants:  GI (Dr. Christella Hartigan) Procedures:  MRCP Antibiotics:  None    Signed:  Manson Passey, MD  [facility] 12/13/2013, 3:48 PM  Pager #: [phone]  Discharge Exam: Filed Vitals:   12/13/13 1500  BP: 90/49  Pulse: 75  Temp: 97.8 F (36.6 C)  Resp: 16   Filed Vitals:   12/12/13 2100 12/13/13 0537 12/13/13 1055 12/13/13 1500  BP: 96/61 93/49 108/55 90/49  Pulse: 79 83  75  Temp: 98 F (36.7 C) 98.2 F (36.8 C)  97.8 F (36.6 C)  TempSrc: Oral Oral  Oral  Resp: 18 20  16   Height:      Weight:      SpO2: 96% 96%  100%    General: Pt is alert, follows commands appropriately, not in acute distress Cardiovascular: Regular rate and rhythm, S1/S2 +, no murmurs, no rubs, no gallops Respiratory: Clear to auscultation bilaterally, no wheezing, no crackles, no rhonchi Abdominal: Soft, non tender, non distended, bowel sounds +, no guarding Extremities: no edema, no cyanosis, pulses palpable bilaterally DP and PT Neuro: Grossly nonfocal  Discharge Instructions  Discharge Orders   Future Appointments Provider Department Dept Phone   01/02/2014 10:30 AM Lesly Dukes, MD [facility] [phone]   Future Orders Complete By Expires   Call MD for:  difficulty breathing,  headache or visual disturbances  As directed    Call MD for:  persistant dizziness or light-headedness  As directed    Call MD for:  persistant nausea and vomiting  As directed    Call MD for:  severe uncontrolled pain  As directed    Diet - low sodium heart healthy  As directed    Discharge instructions  As directed    Comments:     You were cared for by Dr. Manson PasseyAlma Devine (a hospitalist) during your hospital stay. If you have any questions about your discharge medications or the  care you received while you were in the hospital after you are discharged, you can call the unit and ask to speak with the hospitalist on call if the hospitalist that took care of you is not available. Once you are discharged, your primary care physician will handle any further medical issues. Please note that NO REFILLS for any discharge medications will be authorized once you are discharged, as it is imperative that you return to your primary care physician (or establish a relationship with a primary care physician if you do not have one) for your aftercare needs so that they can reassess your need for medications and monitor your lab values. Any outstanding tests can be reviewed by your PCP at your follow up visit. It is also important to review any medicine changes with your PCP. Please bring these d/c instructions with you to your next visit so your physician can review these changes with you.  If you do not have a primary care physician, you can call [phone] for a physician referral. It is highly recommended that you obtain a PCP for hospital follow up.   Increase activity slowly  As directed        Medication List    STOP taking these medications       acetaminophen 325 MG tablet  Commonly known as:  TYLENOL      TAKE these medications       DSS 100 MG Caps  Take 100 mg by mouth 2 (two) times daily as needed for mild constipation.     omeprazole 40 MG capsule  Commonly known as:  PRILOSEC  Take 1 capsule (40 mg total) by mouth 2 (two) times daily.     oxyCODONE-acetaminophen 5-325 MG per tablet  Commonly known as:  PERCOCET  Take 1 tablet by mouth every 4 (four) hours as needed for severe pain.     polyethylene glycol packet  Commonly known as:  MIRALAX / GLYCOLAX  Take 17 g by mouth daily as needed.     prenatal multivitamin Tabs tablet  Take 1 tablet by mouth daily at 12 noon.     promethazine 12.5 MG tablet  Commonly known as:  PHENERGAN  Take 1 tablet (12.5 mg total)  by mouth every 6 (six) hours as needed for nausea or vomiting.     sucralfate 1 G tablet  Commonly known as:  CARAFATE  Take 1 tablet (1 g total) by mouth 4 (four) times daily -  with meals and at bedtime.           Follow-up Information   [facility]. (9 am)    Contact information:   [address] [place] [id number] [phone]       The results of significant diagnostics from this hospitalization (including imaging, microbiology, ancillary and laboratory) are listed below for reference.    Significant Diagnostic  Studies: US Abdomen Complete  12/10/2013   CLINICAL DATA:  Epigastric pain. Cholecystectomy 11/21/2013. Fifteen weeks pregnant.  EXAM: ULTRASOUND ABDOMEN COMPLETE  COMPARISON:  None.  FINDINGS: Gallbladder:  Surgically absent.  Common bile duct:  Diameter: 4 mm, normal.  Liver:  No focal lesion identified. Within normal limits in parenchymal echogenicity.  IVC:  Suboptimally visualized due to pregnancy.  Pancreas:  Suboptimally visualized.  Spleen:  5.2 cm.  Normal.  Right Kidney:  Length: 12 cm. Echogenicity within normal limits. No mass or hydronephrosis visualized.  Left Kidney:  Length: 11.6 cm. Echogenicity within normal limits. No mass or hydronephrosis visualized.  Abdominal aorta:  No aneurysm visualized.  Other findings:  None.  IMPRESSION: Cholecystectomy. No acute abnormality. Suboptimal visualization of viscera due to overlying bowel gas in the upper abdomen.   Electronically Signed   By: Andreas Newport M.D.   On: 12/10/2013 10:48   Mr Abdomen Mrcp Wo Cm  12/10/2013   CLINICAL DATA:  Patient [redacted] weeks pregnant with gallbladder removed on 11/24/2013. Nausea and vomiting persists after gallbladder removal.  EXAM: MRI ABDOMEN WITHOUT  (INCLUDING MRCP)  TECHNIQUE: Multiplanar multisequence MR imaging of the abdomen was performed. Heavily T2-weighted images of the biliary and pancreatic ducts were obtained, and  three-dimensional MRCP images were rendered by post processing.  COMPARISON:  CT ABD/PELVIS W CM dated 07/21/2013  FINDINGS: There is no pleural fluid or pericardial fluid.  No intrahepatic or extrahepatic biliary duct dilatation. No focal hepatic lesion. The common hepatic duct and common bile duct are normal caliber. The no filling defects within the common bile duct. No external compression. The common bile duct measures 4 mm. The pancreatic duct likewise is normal caliber.  Small amount of fluid within the gallbladder fossa site of surgery. No evidence of biloma or abscess. There is artifact in the gallbladder fossa from the cholecystectomy clips. As well as gas in the duodenum.  Spleen, adrenal glands, kidneys are normal. Stomach and limited view of the small bowel and colon are unremarkable. Abdominal lymphadenopathy. No aggressive osseous lesion.  IMPRESSION: 1. Normal common bile duct without evidence of filling defect or compression. No biliary duct dilatation. 2. Normal pancreas without evidence of inflammation duct dilatation. 3. Small amount of fluid fluid in the gallbladder fossa following cholecystectomy. No organized fluid collection, abscess, or biloma.   Electronically Signed   By: Genevive Bi M.D.   On: 12/10/2013 19:32   Mr 3d Recon At Scanner  12/10/2013   CLINICAL DATA:  Patient [redacted] weeks pregnant with gallbladder removed on 11/24/2013. Nausea and vomiting persists after gallbladder removal.  EXAM: MRI ABDOMEN WITHOUT  (INCLUDING MRCP)  TECHNIQUE: Multiplanar multisequence MR imaging of the abdomen was performed. Heavily T2-weighted images of the biliary and pancreatic ducts were obtained, and three-dimensional MRCP images were rendered by post processing.  COMPARISON:  CT ABD/PELVIS W CM dated 07/21/2013  FINDINGS: There is no pleural fluid or pericardial fluid.  No intrahepatic or extrahepatic biliary duct dilatation. No focal hepatic lesion. The common hepatic duct and common bile duct  are normal caliber. The no filling defects within the common bile duct. No external compression. The common bile duct measures 4 mm. The pancreatic duct likewise is normal caliber.  Small amount of fluid within the gallbladder fossa site of surgery. No evidence of biloma or abscess. There is artifact in the gallbladder fossa from the cholecystectomy clips. As well as gas in the duodenum.  Spleen, adrenal glands, kidneys are normal. Stomach and limited view  of the small bowel and colon are unremarkable. Abdominal lymphadenopathy. No aggressive osseous lesion.  IMPRESSION: 1. Normal common bile duct without evidence of filling defect or compression. No biliary duct dilatation. 2. Normal pancreas without evidence of inflammation duct dilatation. 3. Small amount of fluid fluid in the gallbladder fossa following cholecystectomy. No organized fluid collection, abscess, or biloma.   Electronically Signed   By: Genevive Bi M.D.   On: 12/10/2013 19:32   [place] Mfm Fetal Nuchal Translucency  11/26/2013   OBSTETRICAL ULTRASOUND: This exam was performed within a [facility]. The OB US report was generated in the [facility] system, and faxed to the ordering physician.   This report is also available in [employer] and in the [employer]. See [facility] Obstetric US report.  US Abdomen Limited Ruq  11/20/2013   CLINICAL DATA Epigastric pain with nausea and vomiting and diarrhea.  EXAM US ABDOMEN LIMITED - RIGHT UPPER QUADRANT  COMPARISON 10/16/2013  FINDINGS Gallbladder:  Gallbladder is not well distended on the current study. The tiny mobile gallstones seen on the previous exams are not evident on today's study. Gallbladder wall is irregular and appears thickened, measuring up to 4-5 mm in diameter. Hypoechoic striations within the gallbladder wall raise concern for gallbladder wall edema. No evidence for pericholecystic fluid. The sonographer reports a positive sonographic Murphy sign.  Common  bile duct:  Diameter: Nondilated at 2-3 mm diameter.  Liver:  No focal lesion identified. Within normal limits in parenchymal echogenicity.  IMPRESSION Gallbladder wall appears thickened and somewhat irregular and sonographer reports a positive sonographic Murphy sign, both features raising concern for acute cholecystitis. Patient has had gallstones noted on previous studies although they are not evident on this exam. The patient did eat shortly before this study and gallbladder is not well distended which may accentuate gallbladder wall thickness. If the clinical picture is equivocal for acute cholecystitis, nuclear scintigraphy may prove helpful to further evaluate.  SIGNATURE  Electronically Signed   By: Kennith Center M.D.   On: 11/20/2013 19:42    Microbiology: No results found for this or any previous visit (from the past 240 hour(s)).   Labs: Basic Metabolic Panel:  Recent Labs Lab 12/10/13 0921 12/11/13 0443  NA 135* 136*  K 4.1 3.8  CL 98 102  CO2 24 24  GLUCOSE 89 82  BUN 7 4*  CREATININE 0.49* 0.47*  CALCIUM 9.7 8.7   Liver Function Tests:  Recent Labs Lab 12/10/13 0921  AST 18  ALT 28  ALKPHOS 102  BILITOT 0.3  PROT 6.9  ALBUMIN 3.5    Recent Labs Lab 12/10/13 1530  LIPASE 27   No results found for this basename: AMMONIA,  in the last 168 hours CBC:  Recent Labs Lab 12/10/13 0921  WBC 13.6*  HGB 13.4  HCT 38.9  MCV 85.3  PLT 319   Cardiac Enzymes: No results found for this basename: CKTOTAL, CKMB, CKMBINDEX, TROPONINI,  in the last 168 hours BNP: BNP (last 3 results) No results found for this basename: PROBNP,  in the last 8760 hours CBG: No results found for this basename: GLUCAP,  in the last 168 hours  Time coordinating discharge: Over 30 minutes

## 2013-12-13 NOTE — Progress Notes (Signed)
Patient d/c home,stable.- Alaney Witter RN 

## 2013-12-13 NOTE — Progress Notes (Signed)
Patient's d/c instructions given, verbalized understanding,prescriptions given.Hulda Marin- Matalyn Nawaz RN

## 2013-12-13 NOTE — Discharge Instructions (Signed)

## 2013-12-16 ENCOUNTER — Telehealth: Payer: Self-pay

## 2013-12-16 ENCOUNTER — Encounter (HOSPITAL_COMMUNITY): Payer: Self-pay | Admitting: Gastroenterology

## 2013-12-16 ENCOUNTER — Inpatient Hospital Stay (HOSPITAL_COMMUNITY)
Admission: AD | Admit: 2013-12-16 | Discharge: 2013-12-16 | Disposition: A | Discharge status: Home / Self Care | Payer: Medicaid Other | Source: Ambulatory Visit | Attending: Obstetrics & Gynecology | Admitting: Obstetrics & Gynecology

## 2013-12-16 DIAGNOSIS — K8020 Calculus of gallbladder without cholecystitis without obstruction: Secondary | ICD-10-CM

## 2013-12-16 DIAGNOSIS — O210 Mild hyperemesis gravidarum: Secondary | ICD-10-CM | POA: Insufficient documentation

## 2013-12-16 DIAGNOSIS — R109 Unspecified abdominal pain: Secondary | ICD-10-CM | POA: Insufficient documentation

## 2013-12-16 DIAGNOSIS — O99891 Other specified diseases and conditions complicating pregnancy: Secondary | ICD-10-CM | POA: Insufficient documentation

## 2013-12-16 DIAGNOSIS — G8929 Other chronic pain: Secondary | ICD-10-CM

## 2013-12-16 DIAGNOSIS — O9989 Other specified diseases and conditions complicating pregnancy, childbirth and the puerperium: Secondary | ICD-10-CM

## 2013-12-16 DIAGNOSIS — K219 Gastro-esophageal reflux disease without esophagitis: Secondary | ICD-10-CM | POA: Insufficient documentation

## 2013-12-16 DIAGNOSIS — F17200 Nicotine dependence, unspecified, uncomplicated: Secondary | ICD-10-CM | POA: Insufficient documentation

## 2013-12-16 DIAGNOSIS — R1013 Epigastric pain: Secondary | ICD-10-CM

## 2013-12-16 DIAGNOSIS — O219 Vomiting of pregnancy, unspecified: Secondary | ICD-10-CM

## 2013-12-16 DIAGNOSIS — O99611 Diseases of the digestive system complicating pregnancy, first trimester: Secondary | ICD-10-CM

## 2013-12-16 HISTORY — DX: Unspecified abnormal cytological findings in specimens from vagina: R87.629

## 2013-12-16 LAB — URINALYSIS, ROUTINE W REFLEX MICROSCOPIC
Bilirubin Urine: NEGATIVE
Glucose, UA: NEGATIVE mg/dL
KETONES UR: NEGATIVE mg/dL
Nitrite: NEGATIVE
PROTEIN: 30 mg/dL — AB
Specific Gravity, Urine: 1.025 (ref 1.005–1.030)
UROBILINOGEN UA: 0.2 mg/dL (ref 0.0–1.0)
pH: 6 (ref 5.0–8.0)

## 2013-12-16 LAB — URINE MICROSCOPIC-ADD ON

## 2013-12-16 LAB — COMPREHENSIVE METABOLIC PANEL
ALBUMIN: 3.4 g/dL — AB (ref 3.5–5.2)
ALT: 60 U/L — ABNORMAL HIGH (ref 0–35)
AST: 21 U/L (ref 0–37)
Alkaline Phosphatase: 134 U/L — ABNORMAL HIGH (ref 39–117)
BILIRUBIN TOTAL: 0.3 mg/dL (ref 0.3–1.2)
BUN: 4 mg/dL — AB (ref 6–23)
CHLORIDE: 102 meq/L (ref 96–112)
CO2: 24 mEq/L (ref 19–32)
CREATININE: 0.47 mg/dL — AB (ref 0.50–1.10)
Calcium: 9.7 mg/dL (ref 8.4–10.5)
GFR calc Af Amer: >90 mL/min (ref >90)
GFR calc non Af Amer: >90 mL/min (ref >90)
Glucose, Bld: 85 mg/dL (ref 70–99)
Potassium: 3.8 mEq/L (ref 3.7–5.3)
Sodium: 140 mEq/L (ref 137–147)
TOTAL PROTEIN: 6.5 g/dL (ref 6.0–8.3)

## 2013-12-16 LAB — CBC
HCT: 36 % (ref 36.0–46.0)
Hemoglobin: 12.4 g/dL (ref 12.0–15.0)
MCH: 29.6 pg (ref 26.0–34.0)
MCHC: 34.4 g/dL (ref 30.0–36.0)
MCV: 85.9 fL (ref 78.0–100.0)
PLATELETS: 288 10*3/uL (ref 150–400)
RBC: 4.19 MIL/uL (ref 3.87–5.11)
RDW: 14.3 % (ref 11.5–15.5)
WBC: 12.9 10*3/uL — AB (ref 4.0–10.5)

## 2013-12-16 MED ORDER — PROMETHAZINE HCL 25 MG RE SUPP: 25.0000 mg | Freq: Four times a day (QID) | RECTAL | Status: DC | PRN

## 2013-12-16 MED ORDER — RANITIDINE HCL 150 MG PO TABS: 150.0000 mg | ORAL_TABLET | Freq: Every day | ORAL | Status: DC

## 2013-12-16 MED ORDER — PROMETHAZINE HCL 12.5 MG PO TABS: 12.5000 mg | ORAL_TABLET | Freq: Four times a day (QID) | ORAL | Status: DC | PRN

## 2013-12-16 MED ORDER — ONDANSETRON HCL 4 MG/2ML IJ SOLN
4.0000 mg | Freq: Once | INTRAMUSCULAR | Status: AC
  Administered 2013-12-16: 4 mg via INTRAVENOUS
  Filled 2013-12-16: qty 2

## 2013-12-16 MED ORDER — GI COCKTAIL ~~LOC~~
30.0000 mL | Freq: Once | ORAL | Status: AC
  Administered 2013-12-16: 30 mL via ORAL
  Filled 2013-12-16: qty 30

## 2013-12-16 MED ORDER — FAMOTIDINE IN NACL 20-0.9 MG/50ML-% IV SOLN
20.0000 mg | Freq: Once | INTRAVENOUS | Status: AC
  Administered 2013-12-16: 20 mg via INTRAVENOUS
  Filled 2013-12-16: qty 50

## 2013-12-16 MED ORDER — PROMETHAZINE HCL 25 MG/ML IJ SOLN
25.0000 mg | Freq: Once | INTRAVENOUS | Status: AC
  Administered 2013-12-16: 25 mg via INTRAVENOUS
  Filled 2013-12-16: qty 1

## 2013-12-16 NOTE — MAU Provider Note (Signed)
History     CSN: [id number]  Arrival date and time: 12/16/13 1254   First Provider Initiated Contact with Patient 12/16/13 1323      Chief Complaint  Patient presents with  . Emesis  . Abdominal Pain   HPI Ms. [name] [name] is a 33 y.o. G3P0020 at 3840w6d who presents to MAU today with complaint of N/V and upper abdominal pain. The patient has had similar complaints throughout the pregnancy. She had a cholecystectomy on 11/21/13 that did not resolve these symptoms. [facility] and had upper endoscopy that showed mild gastritis. The patient was getting some relief from Phenergan but ran out on Saturday. She states that she tried ODT Zofran, but she gags with that. She states that the pain is constant and burning in the upper abdomen and then she has sharp pains that come and go. She denies fever.   OB History   Grav Para Term Preterm Abortions TAB SAB Ect Mult Living   3 0   2 2    0      Past Medical History  Diagnosis Date  . Narcotic abuse   . Anxiety   . Depression   . Chronic headaches   . H. pylori infection   . UTI (lower urinary tract infection)   . Vaginal Pap smear, abnormal     has not had follow up    Past Surgical History  Procedure Laterality Date  . Abortions    . Wisdom tooth extraction    . Cholecystectomy N/A 11/21/2013    Procedure: LAPAROSCOPIC CHOLECYSTECTOMY ;  Surgeon: Axel FillerArmando Ramirez, MD;  Location: [facility];  Service: General;  Laterality: N/A;  . Esophagogastroduodenoscopy N/A 12/12/2013    Procedure: ESOPHAGOGASTRODUODENOSCOPY (EGD);  Surgeon: Rachael Feeaniel P Jacobs, MD;  [facility];  Service: Endoscopy;  Laterality: N/A;    Family History  Problem Relation Age of Onset  . Diabetes Mother   . Cancer Maternal Aunt   . Cancer Maternal Uncle   . Cancer Paternal Aunt   . Cancer Paternal Uncle   . Hearing loss Neg Hx     History  Substance Use Topics  . Smoking status: Current Every Day Smoker -- 1.00 packs/day for 15  years    Types: Cigarettes  . Smokeless tobacco: Never Used     Comment: Patient has cut back to 1 pack daily from 2 packs daily  . Alcohol Use: No     Comment: recovering addict    Allergies: No Known Allergies  No prescriptions prior to admission    Review of Systems  Constitutional: Negative for fever and malaise/fatigue.  Gastrointestinal: Positive for nausea, vomiting and abdominal pain. Negative for diarrhea and constipation.  Genitourinary: Negative for dysuria, urgency and frequency.       Neg - vaginal bleeding, discharge  Neurological: Positive for dizziness and weakness.   Physical Exam   Blood pressure 109/54, pulse 86, temperature 98.3 F (36.8 C), temperature source Oral, resp. rate 18, height 5\' 5"  (1.651 m), weight 71.94 kg (158 lb 9.6 oz), last menstrual period 08/27/2013, SpO2 99.00%.  Physical Exam  Constitutional: She is oriented to person, place, and time. She appears well-developed and well-nourished. No distress.  HENT:  Head: Normocephalic and atraumatic.  Cardiovascular: Tachycardia present.   Respiratory: Effort normal.  GI: Soft. Bowel sounds are normal. She exhibits no distension and no mass. There is tenderness (mild to moderate upper abdominal tenderness to palpation worse in the epigastric region). There  is no rebound and no guarding.  Neurological: She is alert and oriented to person, place, and time.  Skin: Skin is warm and dry. No erythema.  Psychiatric: She has a normal mood and affect.   Results for orders placed during the hospital encounter of 12/16/13 (from the past 24 hour(s))  URINALYSIS, ROUTINE W REFLEX MICROSCOPIC     Status: Abnormal   Collection Time    12/16/13  1:12 PM      Result Value Ref Range   Color, Urine YELLOW  YELLOW   APPearance HAZY (*) CLEAR   Specific Gravity, Urine 1.025  1.005 - 1.030   pH 6.0  5.0 - 8.0   Glucose, UA NEGATIVE  NEGATIVE mg/dL   Hgb urine dipstick TRACE (*) NEGATIVE   Bilirubin Urine NEGATIVE   NEGATIVE   Ketones, ur NEGATIVE  NEGATIVE mg/dL   Protein, ur 30 (*) NEGATIVE mg/dL   Urobilinogen, UA 0.2  0.0 - 1.0 mg/dL   Nitrite NEGATIVE  NEGATIVE   Leukocytes, UA MODERATE (*) NEGATIVE  URINE MICROSCOPIC-ADD ON     Status: Abnormal   Collection Time    12/16/13  1:12 PM      Result Value Ref Range   Squamous Epithelial / LPF FEW (*) RARE   WBC, UA 7-10  <3 WBC/hpf   RBC / HPF 0-2  <3 RBC/hpf   Bacteria, UA FEW (*) RARE   Urine-Other MICROSCOPIC EXAM PERFORMED ON UNCONCENTRATED URINE    CBC     Status: Abnormal   Collection Time    12/16/13  1:44 PM      Result Value Ref Range   WBC 12.9 (*) 4.0 - 10.5 K/uL   RBC 4.19  3.87 - 5.11 MIL/uL   Hemoglobin 12.4  12.0 - 15.0 g/dL   HCT 16.1  09.6 - 04.5 %   MCV 85.9  78.0 - 100.0 fL   MCH 29.6  26.0 - 34.0 pg   MCHC 34.4  30.0 - 36.0 g/dL   RDW 40.9  81.1 - 91.4 %   Platelets 288  150 - 400 K/uL  COMPREHENSIVE METABOLIC PANEL     Status: Abnormal   Collection Time    12/16/13  1:44 PM      Result Value Ref Range   Sodium 140  137 - 147 mEq/L   Potassium 3.8  3.7 - 5.3 mEq/L   Chloride 102  96 - 112 mEq/L   CO2 24  19 - 32 mEq/L   Glucose, Bld 85  70 - 99 mg/dL   BUN 4 (*) 6 - 23 mg/dL   Creatinine, Ser 7.82 (*) 0.50 - 1.10 mg/dL   Calcium 9.7  8.4 - 95.6 mg/dL   Total Protein 6.5  6.0 - 8.3 g/dL   Albumin 3.4 (*) 3.5 - 5.2 g/dL   AST 21  0 - 37 U/L   ALT 60 (*) 0 - 35 U/L   Alkaline Phosphatase 134 (*) 39 - 117 U/L   Total Bilirubin 0.3  0.3 - 1.2 mg/dL   GFR calc non Af Amer >90  >90 mL/min   GFR calc Af Amer >90  >90 mL/min    MAU Course  Procedures None  MDM FHR - 150 bpm with doppler IV phenergan in D5LR, Pepcid given Discussed patient with Dr. Erin Fulling given IV Zofran and GI cocktail Patient reports improvement in N/V and burning in epigastric region. She continues to have upper abdominal pains that come and go Discussed patient  status and labs with Dr. Erin Fulling. She came to MAU to evaluate  the patient.  Refill Rx for Phenergan, give Rx for Phenergan suppositories and Zantac daily Continue other previously prescribed medications including prilosec and carafate  Assessment and Plan  A: Nausea and vomiting in pregnancy prior to [redacted] week gestation Abdominal pain in pregnancy, antepartum GERD  P: Discharge home Rx for Phenergan and Zantac sent to patient's pharmacy Patient advised to follow-up with [facility] as scheduled for routine prenatal care later this week Patient may return to MAU as needed or if her condition were to change or worsen  Freddi Starr, PA-C 12/16/2013, 5:45 PM

## 2013-12-16 NOTE — MAU Note (Signed)
Patient states she has had upper abdominal pain for a while but is getting worse. Recently had an endoscopy but has continued to have vomiting and pain. Denies bleeding or discharge.

## 2013-12-16 NOTE — MAU Note (Signed)
No relief with percocet, everything got really bad late Friday night.

## 2013-12-16 NOTE — Telephone Encounter (Signed)
Pt called and stated that she was in the hospital last week for stomach pain and the pain in intolerable and wants to know what to do? Re:  Pt has had gall bladder removal and she is followed by GI concerning her epigastric pain.  Last visit with GI specilist was 12/06/13 for her "stomach pain".  Called pt and left message that we are returning her call to return ours.

## 2013-12-16 NOTE — Discharge Instructions (Signed)
Hyperemesis Gravidarum Diet Hyperemesis gravidarum is a severe form of morning sickness. It is characterized by frequent and severe vomiting. It happens during the first trimester of pregnancy. It may be caused by the rapid hormone changes that happen during pregnancy. It is associated with a 5% weight loss of pre-pregnancy weight. The hyperemesis diet may be used to lessen symptoms of nausea and vomiting. EATING GUIDELINES  Eat 5 to 6 small meals daily instead of 3 large meals.  Avoid foods with strong smells.  Avoid drinking 30 minutes before and after meals.  Avoid fried or high-fat foods, such as butter and cream sauces.  Starchy foods are usually well-tolerated, such as cereal, toast, bread, potatoes, pasta, rice, and pretzels.  Eat crackers before you get out of bed in the morning.  Avoid spicy foods.  Ginger may help with nausea. Add  tsp ginger to hot tea or choose ginger tea.  Continue to take your prenatal vitamins as directed by your caregiver. SAMPLE MEAL PLAN Breakfast    cup oatmeal  1 slice toast  1 tsp heart-healthy margarine  1 tsp jelly  1 scrambled egg Midmorning Snack   1 cup low-fat yogurt Lunch   Plain ham sandwich  Carrot or celery sticks  1 small apple  3 graham crackers Midafternoon Snack   Cheese and crackers Dinner  4 oz pork tenderloin  1 small baked potato  1 tsp margarine   cup broccoli   cup grapes Evening Snack  1 cup pudding Document Released: 06/26/2007 Document Revised: 11/21/2011 Document Reviewed: 01/29/2013 [employer] Patient Information 2014 [place]. Heartburn During Pregnancy  Heartburn happens when stomach acid goes up into the esophagus. The esophagus is the tube between the mouth and the stomach. This acid causes a burning pain in the chest or throat. This happens more often in the later part of pregnancy because the womb (uterus) gets larger. It may also happen because of hormone changes. Heartburn  problems often go away after giving birth. HOME CARE  Take all medicine as told by your doctor.  Raise the head of your bed with blocks only as told by your doctor.  Do not exercise right after eating.  Avoid eating 2 3 hours before bed. Do not lie down right after eating.  Eat small meals throughout the day instead of 3 large meals.  Avoid foods that give you heartburn. Foods you may want to avoid include:  Peppers.  Chocolate.  High-fat foods, including fried foods.  Spicy foods.  Garlic and onions.  Citrus fruits, including oranges, grapefruit, lemons, and limes.  Food containing tomatoes or tomato products.  Mint.  Bubbly (carbonated) drinks and drinks with caffeine.  Vinegar. GET HELP IF:  You have any belly (abdominal) pain.  You feel burning in your upper belly or chest, especially after eating or lying down.  You feel sick to your stomach (nauseous) and throw up (vomit).  Your stomach feels upset after you eat. GET HELP RIGHT AWAY IF:  You have bad chest pain that goes down your arm or into your jaw or neck.  You feel sweaty, dizzy, or lightheaded.  You have trouble breathing.  You throw up blood.  You have trouble or pain when swallowing.  You have bloody or black poop (stool).  You have heartburn more than 3 times a week, for more than 2 weeks. MAKE SURE YOU:  Understand these instructions.  Will watch your condition.  Will get help right away if you are not doing well  or get worse. Document Released: 10/01/2010 Document Revised: 06/19/2013 Document Reviewed: 04/17/2013 [facility] Patient Information 2014 [place], [place].

## 2013-12-16 NOTE — MAU Note (Signed)
Constant pain in upper abd, pain on top of that that comes in waves.  Has not kept anything down since discharged from hosp.

## 2013-12-16 NOTE — MAU Provider Note (Signed)
Attestation of Attending Supervision of Advanced Practitioner (CNM/NP): Evaluation and management procedures were performed by the Advanced Practitioner under my supervision and collaboration.  I have reviewed the Advanced Practitioner's note and chart, and I agree with the management and plan.  HARRAWAY-SMITH, Sholanda Croson 10:30 PM

## 2013-12-17 NOTE — Telephone Encounter (Signed)
Pt went to MAU on 12/16/13 for evaluation.

## 2013-12-19 ENCOUNTER — Encounter: Payer: Self-pay | Admitting: Obstetrics & Gynecology

## 2013-12-19 ENCOUNTER — Ambulatory Visit (INDEPENDENT_AMBULATORY_CARE_PROVIDER_SITE_OTHER): Payer: Medicaid Other | Admitting: Obstetrics & Gynecology

## 2013-12-19 VITALS — BP 102/67 | Temp 97.4°F | Ht 66.0 in | Wt 155.8 lb

## 2013-12-19 DIAGNOSIS — K2990 Gastroduodenitis, unspecified, without bleeding: Secondary | ICD-10-CM

## 2013-12-19 DIAGNOSIS — K2970 Gastritis, unspecified, without bleeding: Secondary | ICD-10-CM

## 2013-12-19 DIAGNOSIS — K8012 Calculus of gallbladder with acute and chronic cholecystitis without obstruction: Secondary | ICD-10-CM

## 2013-12-19 DIAGNOSIS — K8010 Calculus of gallbladder with chronic cholecystitis without obstruction: Secondary | ICD-10-CM

## 2013-12-19 DIAGNOSIS — K8000 Calculus of gallbladder with acute cholecystitis without obstruction: Secondary | ICD-10-CM

## 2013-12-19 DIAGNOSIS — O0990 Supervision of high risk pregnancy, unspecified, unspecified trimester: Secondary | ICD-10-CM

## 2013-12-19 LAB — POCT URINALYSIS DIP (DEVICE)
GLUCOSE, UA: NEGATIVE mg/dL
Hgb urine dipstick: NEGATIVE
Ketones, ur: NEGATIVE mg/dL
Leukocytes, UA: NEGATIVE
Nitrite: NEGATIVE
Protein, ur: 100 mg/dL — AB
SPECIFIC GRAVITY, URINE: 1.025 (ref 1.005–1.030)
UROBILINOGEN UA: 1 mg/dL (ref 0.0–1.0)
pH: 7 (ref 5.0–8.0)

## 2013-12-19 MED ORDER — OXYCODONE-ACETAMINOPHEN 5-325 MG PO TABS: 1.0000 | ORAL_TABLET | ORAL | Status: DC | PRN

## 2013-12-19 NOTE — Progress Notes (Signed)
P= 95 C/o of epigastric pain, states "It comes in waves and it is driving me insane." Pt. States percocet takes the edge off but wasn't helping over the weekend. Still N/V but phenergan does allow pt. To keep food down.

## 2013-12-19 NOTE — Progress Notes (Signed)
U/S scheduled 01/09/14 at 1030 am.

## 2013-12-19 NOTE — Progress Notes (Signed)
Nutrition note: 1st nutr visit Pt has h/o cholecystectomy 11/21/13 Pt has lost 4.2# @ 2143w2d.  Pt reports eating 1 meal & 2 snacks/d due to N/V and low appetite. Pt stated she was told to eat a low-fat diet after surgery but wasn't really educated about it - pt reports decreasing fried & spicy foods. Pt reports N/V and heartburn. Pt is taking a PNV. Pt received verbal & written education on general nutrition during pregnancy. Reviewed low-fat diet due to gallbladder removal. Discussed tips to decrease N/V and heartburn. Discussed importance & benefits of BF and option of pumping vs latching. Discussed wt gain goals of 15-25# or 0.6#/wk. Pt agrees to continue taking a PNV and try to include a protein source with all meals & snacks. Pt does not have WIC but plans to apply. Pt does not plan to BF. F/u in 4-6 wks Blondell RevealLaura Mercedees Convery, MS, RD, LDN, [facility]CLC

## 2013-12-24 ENCOUNTER — Telehealth: Payer: Self-pay | Admitting: Internal Medicine

## 2013-12-24 ENCOUNTER — Telehealth: Payer: Self-pay | Admitting: *Deleted

## 2013-12-24 NOTE — Telephone Encounter (Addendum)
Patient called and left message stating she is still having severe upper stomach pain and nausea and she needs a refill on her pain medication. Called patient back and she stated that she needs a refill on her pain medication because she took the last pill earlier. Told patient if she is still needing this medication she needs to contact her GI doctor since they have been giving her the prescriptions. Patient stated that Dr Debroah LoopArnold gave her one most recently though. I asked the patient how long she has been on the percocet and she stated since her surgery when she was 8 weeks and that dr Debroah Looparnold told her, her baby is already addicted to the medication so that's a concern as well. Told patient she can discuss those concerns at her next visit with us but if she needs additional refills on this medication she needs to speak with her GI doctor that she sees on Thursday since we know this is related to what they are treating and seeing her for. Patient verbalized understanding and had no further questions

## 2013-12-24 NOTE — Telephone Encounter (Signed)
Patient left message that she is still having pain and needs a refill on her pain meds.

## 2013-12-24 NOTE — Telephone Encounter (Signed)
Patient with continued epigastric pain and vomiting .  Recently discharged from hospital on PPI and carafate.  Her GYN has recommended she see GI again.  She can only come in an am between 9:30 and 12:00.  She is scheduled for 10:30 on 12/26/13

## 2013-12-26 ENCOUNTER — Encounter: Payer: Self-pay | Admitting: Gastroenterology

## 2013-12-26 ENCOUNTER — Ambulatory Visit (INDEPENDENT_AMBULATORY_CARE_PROVIDER_SITE_OTHER): Payer: Medicaid Other | Admitting: Gastroenterology

## 2013-12-26 VITALS — BP 100/58 | HR 100 | Ht 65.35 in | Wt 156.4 lb

## 2013-12-26 DIAGNOSIS — S39011A Strain of muscle, fascia and tendon of abdomen, initial encounter: Secondary | ICD-10-CM | POA: Insufficient documentation

## 2013-12-26 DIAGNOSIS — R1013 Epigastric pain: Secondary | ICD-10-CM

## 2013-12-26 MED ORDER — LIDOCAINE 5 % EX PTCH: 1.0000 | MEDICATED_PATCH | CUTANEOUS | Status: DC

## 2013-12-26 NOTE — Patient Instructions (Addendum)
You have been given a separate informational sheet regarding your tobacco use, the importance of quitting and local resources to help you quit.  We will call you once we hear back from your OBGYN office

## 2013-12-26 NOTE — Progress Notes (Addendum)
12/26/2013 [name] [name] [id number] [birth year]   History of Present Illness:  This is a 33 year old female who presents to our office today again at the request of her GYN in regards to her complaints of upper abdominal pain.  Please see my note from 12/06/2013 for further details.  Since that visit she has undergone full abdominal ultrasound that was unremarkable as well as an MRI of the abdomen/MRCP that was also unremarkable.  EGD on 12/12/2013 by Dr. Christella HartiganJacobs revealed mild non-specific distal gastritis with biopsies negative for Hpylori and only showing reactive gastropathy.  She is currently on omeprazole 40 mg BID, ranitidine 150 mg daily, and phenergan for the nausea.  She is also taking percocet for the pain.  The nausea has been better and is relieved well by the phenergan.  She was previously taking carafate but felt like it was making the nausea worse so she stopped taking it.  She says that the acid medications have helped with the burning, but she still has sharp pains in her upper abdomen that are severe at times and wake her from sleep.    Current Medications, Allergies, Past Medical History, Past Surgical History, Family History and Social History were [facility] electronic [facility].   Physical Exam: BP 100/58  Pulse 100  Ht 5' 5.35" (1.66 m)  Wt 156 lb 6 oz (70.931 kg)  BMI 25.74 kg/m2  LMP 08/27/2013 General: Well developed white female in no acute distress Head: Normocephalic and atraumatic Eyes:  Sclerae anicteric, conjunctiva pink  Ears: Normal auditory acuity Abdomen: Soft, non-distended.  Normal bowel sounds.  Upper abdominal TTP.  Positive Carnett's sign. Musculoskeletal: Symmetrical with no gross deformities  Extremities: No edema  Neurological: Alert oriented x 4, grossly non-focal Psychological:  Alert and cooperative. Normal mood and affect  Assessment and Recommendations: -Upper abdominal pain:  Exam consistent with abdominal wall strain.   No further imaging or evaluation necessary at this time.  Will contact and discuss with her GYN regarding topical treatment options such as lidoderm patch, etc. -Nausea:  Resolved/improved at this time.  Phenergan has been helping so she should continue this prn.  Addendum:  Spoke with patient's GYN.  Ok to use lidoderm patch each day (on for 12 hours and off for 12 hours).  Need to avoid topical NSAID's such as Voltaren, etc.  Will send to pharmacy and make patient aware.

## 2013-12-26 NOTE — Addendum Note (Signed)
Addended by: Ok AnisSMITH, Haiden Rawlinson A on: 12/26/2013 11:29 AM   Modules accepted: Orders

## 2013-12-30 ENCOUNTER — Encounter: Payer: Self-pay | Admitting: *Deleted

## 2013-12-31 NOTE — Progress Notes (Signed)
Agree w/ Ms. Zehr 

## 2014-01-01 ENCOUNTER — Telehealth: Payer: Self-pay | Admitting: *Deleted

## 2014-01-01 NOTE — Telephone Encounter (Signed)
Form faxed from [facility] that requires prior auth for patients Lidocaine patch. I called [phone] and spoke with [facility]. Per Angelica ChessmanMandy medication has to go to a clinical trial, Angelica ChessmanMandy did note that patient is pregnant and there is a limit on what she can take. Was advised to call back with in 24 hours to know what decision was made. KV:[id number]

## 2014-01-02 ENCOUNTER — Ambulatory Visit (INDEPENDENT_AMBULATORY_CARE_PROVIDER_SITE_OTHER): Payer: Medicaid Other | Admitting: Obstetrics & Gynecology

## 2014-01-02 ENCOUNTER — Telehealth: Payer: Self-pay

## 2014-01-02 ENCOUNTER — Other Ambulatory Visit: Payer: Self-pay | Admitting: Obstetrics & Gynecology

## 2014-01-02 VITALS — BP 113/67 | HR 105 | Temp 98.2°F | Wt 156.3 lb

## 2014-01-02 DIAGNOSIS — Z23 Encounter for immunization: Secondary | ICD-10-CM

## 2014-01-02 DIAGNOSIS — O0990 Supervision of high risk pregnancy, unspecified, unspecified trimester: Secondary | ICD-10-CM

## 2014-01-02 DIAGNOSIS — Z9089 Acquired absence of other organs: Secondary | ICD-10-CM

## 2014-01-02 DIAGNOSIS — F1911 Other psychoactive substance abuse, in remission: Secondary | ICD-10-CM

## 2014-01-02 DIAGNOSIS — Z9049 Acquired absence of other specified parts of digestive tract: Secondary | ICD-10-CM | POA: Insufficient documentation

## 2014-01-02 DIAGNOSIS — O99340 Other mental disorders complicating pregnancy, unspecified trimester: Secondary | ICD-10-CM

## 2014-01-02 DIAGNOSIS — F1920 Other psychoactive substance dependence, uncomplicated: Secondary | ICD-10-CM

## 2014-01-02 DIAGNOSIS — O99320 Drug use complicating pregnancy, unspecified trimester: Secondary | ICD-10-CM

## 2014-01-02 LAB — POCT URINALYSIS DIP (DEVICE)
Bilirubin Urine: NEGATIVE
GLUCOSE, UA: NEGATIVE mg/dL
HGB URINE DIPSTICK: NEGATIVE
Ketones, ur: NEGATIVE mg/dL
NITRITE: NEGATIVE
Protein, ur: NEGATIVE mg/dL
Specific Gravity, Urine: 1.015 (ref 1.005–1.030)
Urobilinogen, UA: 0.2 mg/dL (ref 0.0–1.0)
pH: 6 (ref 5.0–8.0)

## 2014-01-02 MED ORDER — BUTALBITAL-APAP-CAFFEINE 50-325-40 MG PO TABS: 1.0000 | ORAL_TABLET | ORAL | Status: DC | PRN

## 2014-01-02 NOTE — Telephone Encounter (Signed)
Pt. Called stating she was seen today and was told Fioricet would be sent to her pharmacy; prescription printed. [facility]. Called pt. And informed her they are filling it now and should be ready in a few hours.

## 2014-01-02 NOTE — Progress Notes (Signed)
C/o of "really bad withdrawals" after coming off percocet (headaches, sweating, leg cramps, constant diarrhea); now on lidoderm patches for abdominal pain associated with gallbladder removal. Wants to know if baby is withdrawing as well.

## 2014-01-02 NOTE — Progress Notes (Signed)
Anatomy scheduled for next week.  Pt ahs had EGD with biopsies and cholecystectomy in April.  Pain beter.  Pt was placed on percocets for 10 weeks nad said she had withdrawal symptoms after stopping.  It has been 10 days.  She is having some diarrhea.  Minimal cramping.  Pt offered referral to behavioral health or methadone clinic.  Pt does not want either at this time.  Discussed signs of miscarriage.  [facility] at any time if she chooses to seek treatment or signs of miscarriage.   Pt has chronic headaches--referral to [facility]. Fiorcet 14 per month given. AFP today

## 2014-01-02 NOTE — Progress Notes (Signed)
[facility] and make an appt. for patient with Jannifer RodneyLinda Barefoot, FNP.

## 2014-01-03 ENCOUNTER — Encounter: Payer: Self-pay | Admitting: *Deleted

## 2014-01-03 ENCOUNTER — Telehealth: Payer: Self-pay | Admitting: *Deleted

## 2014-01-03 LAB — ALPHA FETOPROTEIN, MATERNAL
AFP: 38.8 IU/mL
Curr Gest Age: 18.3 wks.days
MoM for AFP: 0.94
Open Spina bifida: NEGATIVE

## 2014-01-03 NOTE — Telephone Encounter (Signed)
Patient notified... Patient was given Doug SouJessica Zehr, PA-C advise that is below.. Patient verbalized understanding, patient will contact OB..Marland Kitchen

## 2014-01-03 NOTE — Telephone Encounter (Signed)
[name] [name] came to window insisting to talk to someone. Brought patient back, states she saw Dr. Penne LashLeggett yesterday and she gave her the choice of methadone or subutex. States she has called several doctors and she doesn't want methadone, wants subutex and states [name] [name] will treat her with a letter from [name] [name]. Unable to reach Dr . Penne LashLeggett- discussed patient request with Dr. Macon LargeAnyanwu and letter faxed at patient request.

## 2014-01-03 NOTE — Telephone Encounter (Signed)
Pt left message stating that she was at office yesterday and discussed treatment options. She has found a place where she can receive Subutex but needs a letter from the doctor saying that it is ok.  I consulted with Dr. Penne LashLeggett, received approval for letter regarding Subutex treatment.  I called pt and informed her that I have talked with Dr. Penne LashLeggett and a letter has been prepared. I can fax it for her. She stated that she talked with another nurse in our clinic and a letter has already been faxed. I reviewed the letter which she referred to and stated that I will fax this one also as it contains additional information.  Pt voiced understanding and agreed. [facility] [phone].  Pt also had questions relating to her nausea. She is out of phenergan tablets and insurance will not pay for a refill until tomorrow. She wanted to know if she can use the suppository form even though she has diarrhea. I said that she could and also advised that she may want to try Immodium OTC. Pt voiced understanding.

## 2014-01-03 NOTE — Telephone Encounter (Signed)
She needs to contact her OB to see what else she can use.  Needs some type of patch or topical medication but everything has to be approved through them anyway.  Sorry.  Thank you,  Jess

## 2014-01-03 NOTE — Telephone Encounter (Signed)
[facility]. Not a Medicaid paid drug.  What else can she use for pain?

## 2014-01-06 ENCOUNTER — Telehealth: Payer: Self-pay

## 2014-01-06 NOTE — Telephone Encounter (Signed)
Pt. Called stating she was told by a nurse at Dr. Marvell FullerGessner's office that her lidocain patches would not be approved and they told her to call her OB to find out what medication she could take for pain. Attempted to call pt. No answer. Left message stating we are returning your call please call clinic.  Spoke to Dr. Penne LashLeggett who states pt. May take tylenol as needed or 600mg  Motrin for 4 days max. No narcotics as she is now taking subutex.

## 2014-01-07 NOTE — Telephone Encounter (Signed)
[name] GottronLinda Barefoot,NP for headaches scheduled for May 12th @ 115pm.

## 2014-01-07 NOTE — Telephone Encounter (Signed)
Called patient and informed her of appt and address and phone number for [facility]. Patient verbalized understanding to all and had no further questions

## 2014-01-09 ENCOUNTER — Ambulatory Visit (HOSPITAL_COMMUNITY)
Admission: RE | Admit: 2014-01-09 | Discharge: 2014-01-09 | Disposition: A | Discharge status: Home / Self Care | Payer: Medicaid Other | Source: Ambulatory Visit | Attending: Obstetrics & Gynecology | Admitting: Obstetrics & Gynecology

## 2014-01-09 ENCOUNTER — Encounter: Payer: Self-pay | Admitting: Obstetrics & Gynecology

## 2014-01-09 DIAGNOSIS — O99891 Other specified diseases and conditions complicating pregnancy: Secondary | ICD-10-CM | POA: Insufficient documentation

## 2014-01-09 DIAGNOSIS — O0990 Supervision of high risk pregnancy, unspecified, unspecified trimester: Secondary | ICD-10-CM | POA: Insufficient documentation

## 2014-01-09 DIAGNOSIS — O9989 Other specified diseases and conditions complicating pregnancy, childbirth and the puerperium: Secondary | ICD-10-CM

## 2014-01-09 DIAGNOSIS — O99340 Other mental disorders complicating pregnancy, unspecified trimester: Secondary | ICD-10-CM | POA: Insufficient documentation

## 2014-01-09 DIAGNOSIS — K2970 Gastritis, unspecified, without bleeding: Secondary | ICD-10-CM | POA: Insufficient documentation

## 2014-01-09 DIAGNOSIS — F411 Generalized anxiety disorder: Secondary | ICD-10-CM | POA: Insufficient documentation

## 2014-01-09 DIAGNOSIS — O99330 Smoking (tobacco) complicating pregnancy, unspecified trimester: Secondary | ICD-10-CM | POA: Insufficient documentation

## 2014-01-09 DIAGNOSIS — K2990 Gastroduodenitis, unspecified, without bleeding: Secondary | ICD-10-CM

## 2014-01-09 DIAGNOSIS — Z9089 Acquired absence of other organs: Secondary | ICD-10-CM | POA: Insufficient documentation

## 2014-01-13 ENCOUNTER — Other Ambulatory Visit (HOSPITAL_COMMUNITY): Payer: Self-pay | Admitting: Medical

## 2014-01-14 ENCOUNTER — Telehealth: Payer: Self-pay | Admitting: *Deleted

## 2014-01-14 NOTE — Telephone Encounter (Signed)
Called patient back and told her that she was given 3 refills on her phenergan so she [facility] and [facility] will get [facility] refill ready. Patient verbalized understanding and had no further questions

## 2014-01-14 NOTE — Telephone Encounter (Signed)
Pt left message requesting refill of phenergan.  Please call back.

## 2014-01-21 ENCOUNTER — Institutional Professional Consult (permissible substitution): Payer: Medicaid Other | Admitting: Nurse Practitioner

## 2014-01-28 ENCOUNTER — Institutional Professional Consult (permissible substitution): Payer: Medicaid Other | Admitting: Nurse Practitioner

## 2014-01-30 ENCOUNTER — Ambulatory Visit (INDEPENDENT_AMBULATORY_CARE_PROVIDER_SITE_OTHER): Payer: Medicaid Other | Admitting: Family

## 2014-01-30 VITALS — BP 105/60 | HR 88 | Temp 96.8°F | Wt 157.5 lb

## 2014-01-30 DIAGNOSIS — F1911 Other psychoactive substance abuse, in remission: Secondary | ICD-10-CM

## 2014-01-30 DIAGNOSIS — O0990 Supervision of high risk pregnancy, unspecified, unspecified trimester: Secondary | ICD-10-CM

## 2014-01-30 DIAGNOSIS — F1920 Other psychoactive substance dependence, uncomplicated: Secondary | ICD-10-CM

## 2014-01-30 DIAGNOSIS — O99320 Drug use complicating pregnancy, unspecified trimester: Secondary | ICD-10-CM

## 2014-01-30 LAB — POCT URINALYSIS DIP (DEVICE)
BILIRUBIN URINE: NEGATIVE
GLUCOSE, UA: NEGATIVE mg/dL
Hgb urine dipstick: NEGATIVE
KETONES UR: NEGATIVE mg/dL
Leukocytes, UA: NEGATIVE
Nitrite: NEGATIVE
Protein, ur: NEGATIVE mg/dL
SPECIFIC GRAVITY, URINE: 1.015 (ref 1.005–1.030)
Urobilinogen, UA: 1 mg/dL (ref 0.0–1.0)
pH: 6.5 (ref 5.0–8.0)

## 2014-01-30 MED ORDER — DOCUSATE SODIUM 100 MG PO CAPS: 100.0000 mg | ORAL_CAPSULE | Freq: Two times a day (BID) | ORAL | Status: AC | PRN

## 2014-01-30 NOTE — Progress Notes (Signed)
NICU tour scheduled 02/24/14 at 9 am.

## 2014-01-30 NOTE — Progress Notes (Signed)
Currently taking Subutex.  Scheduled NAS tour with Heather at [phone] and repeat ultrasound for anatomy and growth next week.

## 2014-01-30 NOTE — Progress Notes (Signed)
Patient reports cramping from constipation

## 2014-01-30 NOTE — Progress Notes (Signed)
U/S scheduled 02/07/14 at 915 am. Patient to go to NICU on

## 2014-02-05 ENCOUNTER — Telehealth: Payer: Self-pay | Admitting: *Deleted

## 2014-02-05 NOTE — Telephone Encounter (Signed)
[name] [name] called and left message stating she was in the office last week and was given stool softeners but they are not helping. C/o still extremely constipated.   Called [name] [name] and she reports she has had a small bm today but still feels full and crampy like she has to go more , but can't. Is using colace BID, drinking lots of water, eating fruit.   Instructed her to add metamucil or citrucel to her regimen per package instructions. Then if after a few days, still not good results to switch from colace to pericolace- if still not good results, call us back.

## 2014-02-07 ENCOUNTER — Ambulatory Visit (HOSPITAL_COMMUNITY)
Admission: RE | Admit: 2014-02-07 | Discharge: 2014-02-07 | Disposition: A | Discharge status: Home / Self Care | Payer: Medicaid Other | Source: Ambulatory Visit | Attending: Family | Admitting: Family

## 2014-02-07 ENCOUNTER — Encounter: Payer: Self-pay | Admitting: Family

## 2014-02-07 DIAGNOSIS — F1911 Other psychoactive substance abuse, in remission: Secondary | ICD-10-CM

## 2014-02-07 DIAGNOSIS — Z3689 Encounter for other specified antenatal screening: Secondary | ICD-10-CM | POA: Insufficient documentation

## 2014-02-21 ENCOUNTER — Telehealth: Payer: Self-pay | Admitting: *Deleted

## 2014-02-21 NOTE — Telephone Encounter (Signed)
Pt left message requesting refill of Fioricet.

## 2014-02-25 NOTE — Telephone Encounter (Signed)
Attempted to call patient, number has been disconnected. Patient has appt here 6/18, will make note for her to update phone number

## 2014-02-27 ENCOUNTER — Ambulatory Visit (INDEPENDENT_AMBULATORY_CARE_PROVIDER_SITE_OTHER): Payer: Medicaid Other | Admitting: Family

## 2014-02-27 VITALS — BP 105/62 | HR 99 | Temp 98.8°F | Wt 154.6 lb

## 2014-02-27 DIAGNOSIS — Z9049 Acquired absence of other specified parts of digestive tract: Secondary | ICD-10-CM

## 2014-02-27 DIAGNOSIS — Z23 Encounter for immunization: Secondary | ICD-10-CM

## 2014-02-27 DIAGNOSIS — Z9089 Acquired absence of other organs: Secondary | ICD-10-CM

## 2014-02-27 DIAGNOSIS — O0990 Supervision of high risk pregnancy, unspecified, unspecified trimester: Secondary | ICD-10-CM

## 2014-02-27 LAB — CBC
HCT: 33 % — ABNORMAL LOW (ref 36.0–46.0)
Hemoglobin: 11.2 g/dL — ABNORMAL LOW (ref 12.0–15.0)
MCH: 29.4 pg (ref 26.0–34.0)
MCHC: 33.9 g/dL (ref 30.0–36.0)
MCV: 86.6 fL (ref 78.0–100.0)
PLATELETS: 330 10*3/uL (ref 150–400)
RBC: 3.81 MIL/uL — AB (ref 3.87–5.11)
RDW: 13.3 % (ref 11.5–15.5)
WBC: 15.7 10*3/uL — ABNORMAL HIGH (ref 4.0–10.5)

## 2014-02-27 LAB — POCT URINALYSIS DIP (DEVICE)
Glucose, UA: NEGATIVE mg/dL
Hgb urine dipstick: NEGATIVE
KETONES UR: NEGATIVE mg/dL
LEUKOCYTES UA: NEGATIVE
NITRITE: NEGATIVE
PH: 6.5 (ref 5.0–8.0)
PROTEIN: 100 mg/dL — AB
Specific Gravity, Urine: 1.02 (ref 1.005–1.030)
Urobilinogen, UA: 1 mg/dL (ref 0.0–1.0)

## 2014-02-27 MED ORDER — PROMETHAZINE HCL 12.5 MG PO TABS
12.5000 mg | ORAL_TABLET | Freq: Four times a day (QID) | ORAL | Status: DC | PRN
Start: 2014-02-27 — End: 2014-03-24

## 2014-02-27 MED ORDER — TETANUS-DIPHTH-ACELL PERTUSSIS 5-2.5-18.5 LF-MCG/0.5 IM SUSP
0.5000 mL | Freq: Once | INTRAMUSCULAR | Status: AC
  Administered 2014-02-27: 0.5 mL via INTRAMUSCULAR

## 2014-02-27 NOTE — Progress Notes (Signed)
U/S scheduled 03/13/14 at 815 am. NICU tour scheduled 03/10/14 at 1 pm.

## 2014-02-27 NOTE — Progress Notes (Signed)
Reports continued headaches, occuring approximately 3x/week.  Did not keep appointment to see L. Barefoot.  Requesting fioricet, prefer to reschedule with L. Barefoot for maintenance of headaches.  Missed NICU tour > reschedule.  Upper intermittent abdomen pain, no vaginal bleeding or leaking of flud 2-3x/week.  Reviewed pregnancy precautions.  Schedule growth ultrasound in 2 weeks.  Pt states that it is desired to increase Subutex and we need to write letter stating that it was discussed and we are aware it will be increased by treatment facility.

## 2014-02-27 NOTE — Progress Notes (Signed)
Pain-"top of tummy"   Pt needs refill on phenergan "my pharmacy is telling me that I do not have any more refills"

## 2014-02-28 LAB — RPR

## 2014-02-28 LAB — HIV ANTIBODY (ROUTINE TESTING W REFLEX): HIV: NONREACTIVE

## 2014-02-28 LAB — GLUCOSE TOLERANCE, 1 HOUR (50G) W/O FASTING: GLUCOSE 1 HOUR GTT: 88 mg/dL (ref 70–140)

## 2014-03-02 ENCOUNTER — Encounter: Payer: Self-pay | Admitting: Family

## 2014-03-10 NOTE — Telephone Encounter (Signed)
Error

## 2014-03-13 ENCOUNTER — Encounter: Payer: Self-pay | Admitting: Family Medicine

## 2014-03-13 ENCOUNTER — Ambulatory Visit (HOSPITAL_COMMUNITY)
Admission: RE | Admit: 2014-03-13 | Discharge: 2014-03-13 | Disposition: A | Discharge status: Home / Self Care | Payer: Medicaid Other | Source: Ambulatory Visit | Attending: Family | Admitting: Family

## 2014-03-13 ENCOUNTER — Ambulatory Visit (INDEPENDENT_AMBULATORY_CARE_PROVIDER_SITE_OTHER): Payer: Medicaid Other | Admitting: Family Medicine

## 2014-03-13 VITALS — BP 110/66 | HR 79 | Wt 155.8 lb

## 2014-03-13 DIAGNOSIS — Z9089 Acquired absence of other organs: Secondary | ICD-10-CM | POA: Insufficient documentation

## 2014-03-13 DIAGNOSIS — O99330 Smoking (tobacco) complicating pregnancy, unspecified trimester: Secondary | ICD-10-CM

## 2014-03-13 DIAGNOSIS — Z3689 Encounter for other specified antenatal screening: Secondary | ICD-10-CM | POA: Insufficient documentation

## 2014-03-13 DIAGNOSIS — O99340 Other mental disorders complicating pregnancy, unspecified trimester: Secondary | ICD-10-CM

## 2014-03-13 DIAGNOSIS — O0993 Supervision of high risk pregnancy, unspecified, third trimester: Secondary | ICD-10-CM

## 2014-03-13 DIAGNOSIS — Z9049 Acquired absence of other specified parts of digestive tract: Secondary | ICD-10-CM

## 2014-03-13 LAB — POCT URINALYSIS DIP (DEVICE)
Bilirubin Urine: NEGATIVE
Glucose, UA: NEGATIVE mg/dL
Hgb urine dipstick: NEGATIVE
Ketones, ur: NEGATIVE mg/dL
Leukocytes, UA: NEGATIVE
Nitrite: NEGATIVE
PH: 7 (ref 5.0–8.0)
Protein, ur: NEGATIVE mg/dL
Specific Gravity, Urine: 1.015 (ref 1.005–1.030)
UROBILINOGEN UA: 0.2 mg/dL (ref 0.0–1.0)

## 2014-03-13 NOTE — Addendum Note (Signed)
Addended by: Jolyn LentDOM, Lawren Sexson R on: 03/13/2014 10:14 AM   Modules accepted: Orders

## 2014-03-13 NOTE — Patient Instructions (Signed)
Third Trimester of Pregnancy The third trimester is from week 29 through week 42, months 7 through 9. The third trimester is a time when the fetus is growing rapidly. At the end of the ninth month, the fetus is about 20 inches in length and weighs 6-10 pounds.  BODY CHANGES Your body goes through many changes during pregnancy. The changes vary from woman to woman.   Your weight will continue to increase. You can expect to gain 25-35 pounds (11-16 kg) by the end of the pregnancy.  You may begin to get stretch marks on your hips, abdomen, and breasts.  You may urinate more often because the fetus is moving lower into your pelvis and pressing on your bladder.  You may develop or continue to have heartburn as a result of your pregnancy.  You may develop constipation because certain hormones are causing the muscles that push waste through your intestines to slow down.  You may develop hemorrhoids or swollen, bulging veins (varicose veins).  You may have pelvic pain because of the weight gain and pregnancy hormones relaxing your joints between the bones in your pelvis. Backaches may result from overexertion of the muscles supporting your posture.  You may have changes in your hair. These can include thickening of your hair, rapid growth, and changes in texture. Some women also have hair loss during or after pregnancy, or hair that feels dry or thin. Your hair will most likely return to normal after your baby is born.  Your breasts will continue to grow and be tender. A yellow discharge may leak from your breasts called colostrum.  Your belly button may stick out.  You may feel short of breath because of your expanding uterus.  You may notice the fetus "dropping," or moving lower in your abdomen.  You may have a bloody mucus discharge. This usually occurs a few days to a week before labor begins.  Your cervix becomes thin and soft (effaced) near your due date. WHAT TO EXPECT AT YOUR PRENATAL  EXAMS  You will have prenatal exams every 2 weeks until week 36. Then, you will have weekly prenatal exams. During a routine prenatal visit:  You will be weighed to make sure you and the fetus are growing normally.  Your blood pressure is taken.  Your abdomen will be measured to track your baby's growth.  The fetal heartbeat will be listened to.  Any test results from the previous visit will be discussed.  You may have a cervical check near your due date to see if you have effaced. At around 36 weeks, your caregiver will check your cervix. At the same time, your caregiver will also perform a test on the secretions of the vaginal tissue. This test is to determine if a type of bacteria, Group B streptococcus, is present. Your caregiver will explain this further. Your caregiver may ask you:  What your birth plan is.  How you are feeling.  If you are feeling the baby move.  If you have had any abnormal symptoms, such as leaking fluid, bleeding, severe headaches, or abdominal cramping.  If you have any questions. Other tests or screenings that may be performed during your third trimester include:  Blood tests that check for low iron levels (anemia).  Fetal testing to check the health, activity level, and growth of the fetus. Testing is done if you have certain medical conditions or if there are problems during the pregnancy. FALSE LABOR You may feel small, irregular contractions that   eventually go away. These are called Braxton Hicks contractions, or false labor. Contractions may last for hours, days, or even weeks before true labor sets in. If contractions come at regular intervals, intensify, or become painful, it is best to be seen by your caregiver.  SIGNS OF LABOR   Menstrual-like cramps.  Contractions that are 5 minutes apart or less.  Contractions that start on the top of the uterus and spread down to the lower abdomen and back.  A sense of increased pelvic pressure or back  pain.  A watery or bloody mucus discharge that comes from the vagina. If you have any of these signs before the 37th week of pregnancy, call your caregiver right away. You need to go to the hospital to get checked immediately. HOME CARE INSTRUCTIONS   Avoid all smoking, herbs, alcohol, and unprescribed drugs. These chemicals affect the formation and growth of the baby.  Follow your caregiver's instructions regarding medicine use. There are medicines that are either safe or unsafe to take during pregnancy.  Exercise only as directed by your caregiver. Experiencing uterine cramps is a good sign to stop exercising.  Continue to eat regular, healthy meals.  Wear a good support bra for breast tenderness.  Do not use hot tubs, steam rooms, or saunas.  Wear your seat belt at all times when driving.  Avoid raw meat, uncooked cheese, cat litter boxes, and soil used by cats. These carry germs that can cause birth defects in the baby.  Take your prenatal vitamins.  Try taking a stool softener (if your caregiver approves) if you develop constipation. Eat more high-fiber foods, such as fresh vegetables or fruit and whole grains. Drink plenty of fluids to keep your urine clear or pale yellow.  Take warm sitz baths to soothe any pain or discomfort caused by hemorrhoids. Use hemorrhoid cream if your caregiver approves.  If you develop varicose veins, wear support hose. Elevate your feet for 15 minutes, 3-4 times a day. Limit salt in your diet.  Avoid heavy lifting, wear low heal shoes, and practice good posture.  Rest a lot with your legs elevated if you have leg cramps or low back pain.  Visit your dentist if you have not gone during your pregnancy. Use a soft toothbrush to brush your teeth and be gentle when you floss.  A sexual relationship may be continued unless your caregiver directs you otherwise.  Do not travel far distances unless it is absolutely necessary and only with the approval  of your caregiver.  Take prenatal classes to understand, practice, and ask questions about the labor and delivery.  Make a trial run to the hospital.  Pack your hospital bag.  Prepare the baby's nursery.  Continue to go to all your prenatal visits as directed by your caregiver. SEEK MEDICAL CARE IF:  You are unsure if you are in labor or if your water has broken.  You have dizziness.  You have mild pelvic cramps, pelvic pressure, or nagging pain in your abdominal area.  You have persistent nausea, vomiting, or diarrhea.  You have a bad smelling vaginal discharge.  You have pain with urination. SEEK IMMEDIATE MEDICAL CARE IF:   You have a fever.  You are leaking fluid from your vagina.  You have spotting or bleeding from your vagina.  You have severe abdominal cramping or pain.  You have rapid weight loss or gain.  You have shortness of breath with chest pain.  You notice sudden or extreme swelling   of your face, hands, ankles, feet, or legs.  You have not felt your baby move in over an hour.  You have severe headaches that do not go away with medicine.  You have vision changes. Document Released: 08/23/2001 Document Revised: 09/03/2013 Document Reviewed: 10/30/2012 ExitCare Patient Information 2015 [employer]. This information is not intended to replace advice given to you by your health care provider. Make sure you discuss any questions you have with your health care provider.  

## 2014-03-13 NOTE — Progress Notes (Addendum)
+  FM, no lof, no vb, no ctx On subutex 8mg  BID, follow up with pain clinic in 4weeks, completed [facility] Tour, serum drug screen today. Reviewed US today- repeat 4-6 wks  [name] [name] is a 33 y.o. G3P1011 at 4960w2d  here for ROB visit.  Discussed with Patient:  -Plans to breast feed.  All questions answered. -Continue prenatal vitamins. -Reviewed fetal kick counts (Pt to perform daily at a time when the baby is active, lie laterally with both hands on belly in quiet room and count all movements (hiccups, shoulder rolls, obvious kicks, etc); pt is to report to clinic or MAU for less than 10 movements felt in a one hour time period-pt told as soon as she counts 10 movements the count is complete.)  - Routine precautions discussed (depression, infection s/s).   Patient provided with all pertinent phone numbers for emergencies. - RTC for any VB, regular, painful cramps/ctxs occurring at a rate of >2/10 min, fever (100.5 or higher), n/v/d, any pain that is unresolving or worsening, LOF, decreased fetal movement, CP, SOB, edema  Problems: Patient Active Problem List   Diagnosis Date Noted  . History of cholecystectomy 01/02/2014  . History of drug abuse 01/02/2014  . Abdominal wall strain 12/26/2013  . Unspecified gastritis and gastroduodenitis without mention of hemorrhage 12/12/2013  . Abdominal pain 12/10/2013  . Tobacco abuse 12/10/2013  . Anxiety 12/10/2013  . Depression 12/10/2013  . Nausea and vomiting in pregnancy 12/10/2013  . Abdominal pain, epigastric 12/06/2013  . High-risk pregnancy 11/06/2013    To Do: 1.   [ ]  Vaccines: Flu: dec Tdap: recd [ ]  BCM: undecided  Edu: [x ] PTL precautions; [ ]  BF class; [ ]  childbirth class; [ ]   BF counseling;

## 2014-03-24 ENCOUNTER — Other Ambulatory Visit: Payer: Self-pay | Admitting: Family

## 2014-03-27 ENCOUNTER — Ambulatory Visit (INDEPENDENT_AMBULATORY_CARE_PROVIDER_SITE_OTHER): Payer: Medicaid Other | Admitting: Obstetrics & Gynecology

## 2014-03-27 VITALS — BP 103/65 | HR 91 | Temp 98.4°F | Wt 153.8 lb

## 2014-03-27 DIAGNOSIS — O219 Vomiting of pregnancy, unspecified: Secondary | ICD-10-CM

## 2014-03-27 LAB — POCT URINALYSIS DIP (DEVICE)
Bilirubin Urine: NEGATIVE
GLUCOSE, UA: 100 mg/dL — AB
HGB URINE DIPSTICK: NEGATIVE
Ketones, ur: NEGATIVE mg/dL
Leukocytes, UA: NEGATIVE
NITRITE: NEGATIVE
PROTEIN: NEGATIVE mg/dL
Specific Gravity, Urine: 1.01 (ref 1.005–1.030)
Urobilinogen, UA: 0.2 mg/dL (ref 0.0–1.0)
pH: 7 (ref 5.0–8.0)

## 2014-03-27 MED ORDER — SUCRALFATE 1 G PO TABS: 1.0000 g | ORAL_TABLET | Freq: Three times a day (TID) | ORAL | Status: DC

## 2014-03-27 MED ORDER — METOCLOPRAMIDE HCL 10 MG PO TABS: 10.0000 mg | ORAL_TABLET | Freq: Four times a day (QID) | ORAL | Status: AC

## 2014-03-27 NOTE — Progress Notes (Signed)
Nutrition note: consult for wt loss & extreme heartburn Pt has lost 6.2# @ 6047w2d. Pt reports having severe N/V and heartburn after anything she eats. Pt is taking 2 PNV - one at night & one in the morning. Pt reports drinking ~7x 16 oz of sweet tea/d, which could be one reason for her heartburn. Pt asked about Ensure for her wt loss. Education: encouraged decreasing tea to no more than 4x 8oz/d (200mg  of caffeine/d), to drink more in between her meals vs with her meal, drink small amounts of milk vs large amounts, which can cause acidity in stomach to increase and make heartburn worse. Discussed either Ensure or Carnation Breakfast Essentials - encouraged ~1/2 2x/d for snacks to help with wt gain. Encouraged sitting up right/ walking after meals to decrease heartburn. Pt agrees to try these suggestions. F/u in 2-4 wks Blondell RevealLaura Cicilia Clinger, MS, RD, LDN, [facility] Center[facility]

## 2014-03-27 NOTE — Patient Instructions (Signed)
Heartburn During Pregnancy  °Heartburn is a burning sensation in the chest caused by stomach acid backing up into the esophagus. Heartburn is common in pregnancy because a certain hormone (progesterone) is released when a woman is pregnant. The progesterone hormone may relax the valve that separates the esophagus from the stomach. This allows acid to go up into the esophagus, causing heartburn. Heartburn may also happen in pregnancy because the enlarging uterus pushes up on the stomach, which pushes more acid into the esophagus. This is especially true in the later stages of pregnancy. Heartburn problems usually go away after giving birth. °CAUSES  °Heartburn is caused by stomach acid backing up into the esophagus. During pregnancy, this may result from various things, including:  °· The progesterone hormone. °· Changing hormone levels. °· The growing uterus pushing stomach acid upward. °· Large meals. °· Certain foods and drinks. °· Exercise. °· Increased acid production. °SIGNS AND SYMPTOMS  °· Burning pain in the chest or lower throat. °· Bitter taste in the mouth. °· Coughing. °DIAGNOSIS  °Your health care provider will typically diagnose heartburn by taking a careful history of your concern. Blood tests may be done to check for a certain type of bacteria that is associated with heartburn. Sometimes, heartburn is diagnosed by prescribing a heartburn medicine to see if the symptoms improve. In some cases, a procedure called an endoscopy may be done. In this procedure, a tube with a light and a camera on the end (endoscope) is used to examine the esophagus and the stomach. °TREATMENT  °Treatment will vary depending on the severity of your symptoms. Your health care provider may recommend: °· Over-the-counter medicines (antacids, acid reducers) for mild heartburn. °· Prescription medicines to decrease stomach acid or to protect your stomach lining. °· Certain changes in your diet. °· Elevating the head of your bed  by putting blocks under the legs. This helps prevent stomach acid from backing up into the esophagus when you are lying down. °HOME CARE INSTRUCTIONS  °· Only take over-the-counter or prescription medicines as directed by your health care provider. °· Raise the head of your bed by putting blocks under the legs if instructed to do so by your health care provider. Sleeping with more pillows is not effective because it only changes the position of your head. °· Do not exercise right after eating. °· Avoid eating 2-3 hours before bed. Do not lie down right after eating. °· Eat small meals throughout the day instead of three large meals. °· Identify foods and beverages that make your symptoms worse and avoid them. Foods you may want to avoid include: °¨ Peppers. °¨ Chocolate. °¨ High-fat foods, including fried foods. °¨ Spicy foods. °¨ Garlic and onions. °¨ Citrus fruits, including oranges, grapefruit, lemons, and limes. °¨ Food containing tomatoes or tomato products. °¨ Mint. °¨ Carbonated and caffeinated drinks. °¨ Vinegar. °SEEK MEDICAL CARE IF: °· You have abdominal pain of any kind. °· You feel burning in your upper abdomen or chest, especially after eating or lying down. °· You have nausea and vomiting. °· Your stomach feels upset after you eat. °SEEK IMMEDIATE MEDICAL CARE IF:  °· You have severe chest pain that goes down your arm or into your jaw or neck. °· You feel sweaty, dizzy, or light-headed. °· You become short of breath. °· You vomit blood. °· You have difficulty or pain with swallowing. °· You have bloody or black, tarry stools. °· You have episodes of heartburn more than 3 times a   week, for more than 2 weeks. °MAKE SURE YOU: °· Understand these instructions. °· Will watch your condition. °· Will get help right away if you are not doing well or get worse. °Document Released: 08/26/2000 Document Revised: 09/03/2013 Document Reviewed: 04/17/2013 °ExitCare® Patient Information ©2015 [employer]. This  information is not intended to replace advice given to you by your health care provider. Make sure you discuss any questions you have with your health care provider. ° °

## 2014-03-27 NOTE — Progress Notes (Signed)
Nausea and reflux still present, weight loss noted. Rec repeat US for growth in 3 weeks. Will see nutrition next. Add reglan and sucralfate.

## 2014-04-10 ENCOUNTER — Ambulatory Visit (INDEPENDENT_AMBULATORY_CARE_PROVIDER_SITE_OTHER): Payer: Medicaid Other | Admitting: Obstetrics & Gynecology

## 2014-04-10 VITALS — BP 115/66 | HR 83 | Temp 98.2°F | Wt 156.3 lb

## 2014-04-10 DIAGNOSIS — O9989 Other specified diseases and conditions complicating pregnancy, childbirth and the puerperium: Secondary | ICD-10-CM

## 2014-04-10 DIAGNOSIS — K2970 Gastritis, unspecified, without bleeding: Secondary | ICD-10-CM

## 2014-04-10 DIAGNOSIS — R51 Headache: Secondary | ICD-10-CM

## 2014-04-10 DIAGNOSIS — O26893 Other specified pregnancy related conditions, third trimester: Secondary | ICD-10-CM

## 2014-04-10 DIAGNOSIS — K2990 Gastroduodenitis, unspecified, without bleeding: Principal | ICD-10-CM

## 2014-04-10 LAB — POCT URINALYSIS DIP (DEVICE)
Bilirubin Urine: NEGATIVE
Glucose, UA: NEGATIVE mg/dL
Hgb urine dipstick: NEGATIVE
Ketones, ur: NEGATIVE mg/dL
Leukocytes, UA: NEGATIVE
NITRITE: NEGATIVE
PROTEIN: 30 mg/dL — AB
SPECIFIC GRAVITY, URINE: 1.015 (ref 1.005–1.030)
UROBILINOGEN UA: 0.2 mg/dL (ref 0.0–1.0)
pH: 6.5 (ref 5.0–8.0)

## 2014-04-10 MED ORDER — BUTALBITAL-APAP-CAFFEINE 50-500-40 MG PO TABS: 1.0000 | ORAL_TABLET | Freq: Four times a day (QID) | ORAL | Status: DC | PRN

## 2014-04-10 MED ORDER — COMPLETENATE 29-1 MG PO CHEW: 1.0000 | CHEWABLE_TABLET | Freq: Every day | ORAL | Status: AC

## 2014-04-10 NOTE — Patient Instructions (Signed)
Third Trimester of Pregnancy The third trimester is from week 29 through week 42, months 7 through 9. The third trimester is a time when the fetus is growing rapidly. At the end of the ninth month, the fetus is about 20 inches in length and weighs 6-10 pounds.  BODY CHANGES Your body goes through many changes during pregnancy. The changes vary from woman to woman.   Your weight will continue to increase. You can expect to gain 25-35 pounds (11-16 kg) by the end of the pregnancy.  You may begin to get stretch marks on your hips, abdomen, and breasts.  You may urinate more often because the fetus is moving lower into your pelvis and pressing on your bladder.  You may develop or continue to have heartburn as a result of your pregnancy.  You may develop constipation because certain hormones are causing the muscles that push waste through your intestines to slow down.  You may develop hemorrhoids or swollen, bulging veins (varicose veins).  You may have pelvic pain because of the weight gain and pregnancy hormones relaxing your joints between the bones in your pelvis. Backaches may result from overexertion of the muscles supporting your posture.  You may have changes in your hair. These can include thickening of your hair, rapid growth, and changes in texture. Some women also have hair loss during or after pregnancy, or hair that feels dry or thin. Your hair will most likely return to normal after your baby is born.  Your breasts will continue to grow and be tender. A yellow discharge may leak from your breasts called colostrum.  Your belly button may stick out.  You may feel short of breath because of your expanding uterus.  You may notice the fetus "dropping," or moving lower in your abdomen.  You may have a bloody mucus discharge. This usually occurs a few days to a week before labor begins.  Your cervix becomes thin and soft (effaced) near your due date. WHAT TO EXPECT AT YOUR PRENATAL  EXAMS  You will have prenatal exams every 2 weeks until week 36. Then, you will have weekly prenatal exams. During a routine prenatal visit:  You will be weighed to make sure you and the fetus are growing normally.  Your blood pressure is taken.  Your abdomen will be measured to track your baby's growth.  The fetal heartbeat will be listened to.  Any test results from the previous visit will be discussed.  You may have a cervical check near your due date to see if you have effaced. At around 36 weeks, your caregiver will check your cervix. At the same time, your caregiver will also perform a test on the secretions of the vaginal tissue. This test is to determine if a type of bacteria, Group B streptococcus, is present. Your caregiver will explain this further. Your caregiver may ask you:  What your birth plan is.  How you are feeling.  If you are feeling the baby move.  If you have had any abnormal symptoms, such as leaking fluid, bleeding, severe headaches, or abdominal cramping.  If you have any questions. Other tests or screenings that may be performed during your third trimester include:  Blood tests that check for low iron levels (anemia).  Fetal testing to check the health, activity level, and growth of the fetus. Testing is done if you have certain medical conditions or if there are problems during the pregnancy. FALSE LABOR You may feel small, irregular contractions that   eventually go away. These are called Braxton Hicks contractions, or false labor. Contractions may last for hours, days, or even weeks before true labor sets in. If contractions come at regular intervals, intensify, or become painful, it is best to be seen by your caregiver.  SIGNS OF LABOR   Menstrual-like cramps.  Contractions that are 5 minutes apart or less.  Contractions that start on the top of the uterus and spread down to the lower abdomen and back.  A sense of increased pelvic pressure or back  pain.  A watery or bloody mucus discharge that comes from the vagina. If you have any of these signs before the 37th week of pregnancy, call your caregiver right away. You need to go to the hospital to get checked immediately. HOME CARE INSTRUCTIONS   Avoid all smoking, herbs, alcohol, and unprescribed drugs. These chemicals affect the formation and growth of the baby.  Follow your caregiver's instructions regarding medicine use. There are medicines that are either safe or unsafe to take during pregnancy.  Exercise only as directed by your caregiver. Experiencing uterine cramps is a good sign to stop exercising.  Continue to eat regular, healthy meals.  Wear a good support bra for breast tenderness.  Do not use hot tubs, steam rooms, or saunas.  Wear your seat belt at all times when driving.  Avoid raw meat, uncooked cheese, cat litter boxes, and soil used by cats. These carry germs that can cause birth defects in the baby.  Take your prenatal vitamins.  Try taking a stool softener (if your caregiver approves) if you develop constipation. Eat more high-fiber foods, such as fresh vegetables or fruit and whole grains. Drink plenty of fluids to keep your urine clear or pale yellow.  Take warm sitz baths to soothe any pain or discomfort caused by hemorrhoids. Use hemorrhoid cream if your caregiver approves.  If you develop varicose veins, wear support hose. Elevate your feet for 15 minutes, 3-4 times a day. Limit salt in your diet.  Avoid heavy lifting, wear low heal shoes, and practice good posture.  Rest a lot with your legs elevated if you have leg cramps or low back pain.  Visit your dentist if you have not gone during your pregnancy. Use a soft toothbrush to brush your teeth and be gentle when you floss.  A sexual relationship may be continued unless your caregiver directs you otherwise.  Do not travel far distances unless it is absolutely necessary and only with the approval  of your caregiver.  Take prenatal classes to understand, practice, and ask questions about the labor and delivery.  Make a trial run to the hospital.  Pack your hospital bag.  Prepare the baby's nursery.  Continue to go to all your prenatal visits as directed by your caregiver. SEEK MEDICAL CARE IF:  You are unsure if you are in labor or if your water has broken.  You have dizziness.  You have mild pelvic cramps, pelvic pressure, or nagging pain in your abdominal area.  You have persistent nausea, vomiting, or diarrhea.  You have a bad smelling vaginal discharge.  You have pain with urination. SEEK IMMEDIATE MEDICAL CARE IF:   You have a fever.  You are leaking fluid from your vagina.  You have spotting or bleeding from your vagina.  You have severe abdominal cramping or pain.  You have rapid weight loss or gain.  You have shortness of breath with chest pain.  You notice sudden or extreme swelling   of your face, hands, ankles, feet, or legs.  You have not felt your baby move in over an hour.  You have severe headaches that do not go away with medicine.  You have vision changes. Document Released: 08/23/2001 Document Revised: 09/03/2013 Document Reviewed: 10/30/2012 ExitCare Patient Information 2015 [employer]. This information is not intended to replace advice given to you by your health care provider. Make sure you discuss any questions you have with your health care provider.  

## 2014-04-10 NOTE — Progress Notes (Signed)
Reports occasional pelvic pressure.  

## 2014-04-10 NOTE — Progress Notes (Signed)
Despite current treatment still symptomatic heartburn and nausea.will retest for h. Pylori.

## 2014-04-11 LAB — H. PYLORI ANTIBODY, IGG: H PYLORI IGG: 2.79 {ISR} — AB

## 2014-04-14 ENCOUNTER — Telehealth: Payer: Self-pay | Admitting: *Deleted

## 2014-04-14 ENCOUNTER — Telehealth: Payer: Self-pay

## 2014-04-14 DIAGNOSIS — Z3493 Encounter for supervision of normal pregnancy, unspecified, third trimester: Secondary | ICD-10-CM

## 2014-04-14 MED ORDER — PRENATAL VITAMINS 0.8 MG PO TABS: 1.0000 | ORAL_TABLET | Freq: Every day | ORAL | Status: DC

## 2014-04-14 NOTE — Telephone Encounter (Signed)
Patient called and stated that the PNV that was sent to her pharmacy was not covered by medicaid. She requests that another vitamin be sent in. Rx to pharmacy.

## 2014-04-14 NOTE — Telephone Encounter (Signed)
Called patient who states since last Thursday she has been under a lot of stress (a move and life she states) and she has been experiencing severe cramping and hurting. States "I was having contractions and started timing them and as soon as I laid down and relaxed they went away but as soon as I got up within 15 minutes the cramping is back." patient states she had her gall bladder removed and the pain that she was experiencing then is now back. Informed patient I would speak to Dr. Debroah LoopArnold who last saw her and call her back with recommendations.

## 2014-04-14 NOTE — Telephone Encounter (Signed)
Patent called stating she has been experiencing cramping and pain over the weekend and would like to know if she needs to come in or if there is something she can do at home.

## 2014-04-14 NOTE — Telephone Encounter (Signed)
Spoke to Dr. Debroah LoopArnold who reccomends patient come to MAU to be evaluated for severe cramping. Patient called and informed.

## 2014-04-18 ENCOUNTER — Other Ambulatory Visit: Payer: Self-pay | Admitting: Family

## 2014-04-21 ENCOUNTER — Telehealth: Payer: Self-pay | Admitting: General Practice

## 2014-04-21 DIAGNOSIS — O219 Vomiting of pregnancy, unspecified: Secondary | ICD-10-CM

## 2014-04-21 MED ORDER — PROMETHAZINE HCL 12.5 MG PO TABS: 12.5000 mg | ORAL_TABLET | Freq: Four times a day (QID) | ORAL | Status: DC | PRN

## 2014-04-21 NOTE — Telephone Encounter (Addendum)
Patient called and left message stating she would like a refill on her phenergan. Received order from Dr Erin FullingHarraway Smith to refill med x 1. Called patient back and informed her of refill. Patient verbalized understanding and had no other questions

## 2014-04-24 ENCOUNTER — Ambulatory Visit (INDEPENDENT_AMBULATORY_CARE_PROVIDER_SITE_OTHER): Payer: Medicaid Other | Admitting: Obstetrics & Gynecology

## 2014-04-24 ENCOUNTER — Telehealth: Payer: Self-pay | Admitting: *Deleted

## 2014-04-24 VITALS — BP 108/66 | HR 90 | Temp 97.9°F | Wt 154.1 lb

## 2014-04-24 DIAGNOSIS — R51 Headache: Secondary | ICD-10-CM

## 2014-04-24 DIAGNOSIS — O9989 Other specified diseases and conditions complicating pregnancy, childbirth and the puerperium: Secondary | ICD-10-CM

## 2014-04-24 DIAGNOSIS — O0993 Supervision of high risk pregnancy, unspecified, third trimester: Secondary | ICD-10-CM

## 2014-04-24 DIAGNOSIS — R519 Headache, unspecified: Secondary | ICD-10-CM

## 2014-04-24 DIAGNOSIS — O0990 Supervision of high risk pregnancy, unspecified, unspecified trimester: Secondary | ICD-10-CM

## 2014-04-24 DIAGNOSIS — O26893 Other specified pregnancy related conditions, third trimester: Secondary | ICD-10-CM

## 2014-04-24 LAB — POCT URINALYSIS DIP (DEVICE)
GLUCOSE, UA: NEGATIVE mg/dL
Hgb urine dipstick: NEGATIVE
Leukocytes, UA: NEGATIVE
Nitrite: NEGATIVE
PROTEIN: 30 mg/dL — AB
SPECIFIC GRAVITY, URINE: 1.02 (ref 1.005–1.030)
UROBILINOGEN UA: 1 mg/dL (ref 0.0–1.0)
pH: 7 (ref 5.0–8.0)

## 2014-04-24 MED ORDER — CLARITHROMYCIN 500 MG PO TABS: 500.0000 mg | ORAL_TABLET | Freq: Two times a day (BID) | ORAL | Status: AC

## 2014-04-24 MED ORDER — AMOXICILLIN 500 MG PO CAPS: 1000.0000 mg | ORAL_CAPSULE | Freq: Two times a day (BID) | ORAL | Status: AC

## 2014-04-24 MED ORDER — BUTALBITAL-APAP-CAFFEINE 50-500-40 MG PO TABS: 1.0000 | ORAL_TABLET | Freq: Four times a day (QID) | ORAL | Status: DC | PRN

## 2014-04-24 NOTE — Telephone Encounter (Signed)
Patient called and wanted to know what her white blood count was. I called her back and informed her that we did not check the white count. I advised that if she is concerned about it she can request it at her next visit next week. Patient agrees with this.

## 2014-04-24 NOTE — Patient Instructions (Signed)
Heartburn During Pregnancy  °Heartburn is a burning sensation in the chest caused by stomach acid backing up into the esophagus. Heartburn is common in pregnancy because a certain hormone (progesterone) is released when a woman is pregnant. The progesterone hormone may relax the valve that separates the esophagus from the stomach. This allows acid to go up into the esophagus, causing heartburn. Heartburn may also happen in pregnancy because the enlarging uterus pushes up on the stomach, which pushes more acid into the esophagus. This is especially true in the later stages of pregnancy. Heartburn problems usually go away after giving birth. °CAUSES  °Heartburn is caused by stomach acid backing up into the esophagus. During pregnancy, this may result from various things, including:  °· The progesterone hormone. °· Changing hormone levels. °· The growing uterus pushing stomach acid upward. °· Large meals. °· Certain foods and drinks. °· Exercise. °· Increased acid production. °SIGNS AND SYMPTOMS  °· Burning pain in the chest or lower throat. °· Bitter taste in the mouth. °· Coughing. °DIAGNOSIS  °Your health care provider will typically diagnose heartburn by taking a careful history of your concern. Blood tests may be done to check for a certain type of bacteria that is associated with heartburn. Sometimes, heartburn is diagnosed by prescribing a heartburn medicine to see if the symptoms improve. In some cases, a procedure called an endoscopy may be done. In this procedure, a tube with a light and a camera on the end (endoscope) is used to examine the esophagus and the stomach. °TREATMENT  °Treatment will vary depending on the severity of your symptoms. Your health care provider may recommend: °· Over-the-counter medicines (antacids, acid reducers) for mild heartburn. °· Prescription medicines to decrease stomach acid or to protect your stomach lining. °· Certain changes in your diet. °· Elevating the head of your bed  by putting blocks under the legs. This helps prevent stomach acid from backing up into the esophagus when you are lying down. °HOME CARE INSTRUCTIONS  °· Only take over-the-counter or prescription medicines as directed by your health care provider. °· Raise the head of your bed by putting blocks under the legs if instructed to do so by your health care provider. Sleeping with more pillows is not effective because it only changes the position of your head. °· Do not exercise right after eating. °· Avoid eating 2-3 hours before bed. Do not lie down right after eating. °· Eat small meals throughout the day instead of three large meals. °· Identify foods and beverages that make your symptoms worse and avoid them. Foods you may want to avoid include: °¨ Peppers. °¨ Chocolate. °¨ High-fat foods, including fried foods. °¨ Spicy foods. °¨ Garlic and onions. °¨ Citrus fruits, including oranges, grapefruit, lemons, and limes. °¨ Food containing tomatoes or tomato products. °¨ Mint. °¨ Carbonated and caffeinated drinks. °¨ Vinegar. °SEEK MEDICAL CARE IF: °· You have abdominal pain of any kind. °· You feel burning in your upper abdomen or chest, especially after eating or lying down. °· You have nausea and vomiting. °· Your stomach feels upset after you eat. °SEEK IMMEDIATE MEDICAL CARE IF:  °· You have severe chest pain that goes down your arm or into your jaw or neck. °· You feel sweaty, dizzy, or light-headed. °· You become short of breath. °· You vomit blood. °· You have difficulty or pain with swallowing. °· You have bloody or black, tarry stools. °· You have episodes of heartburn more than 3 times a   week, for more than 2 weeks. °MAKE SURE YOU: °· Understand these instructions. °· Will watch your condition. °· Will get help right away if you are not doing well or get worse. °Document Released: 08/26/2000 Document Revised: 09/03/2013 Document Reviewed: 04/17/2013 °ExitCare® Patient Information ©2015 [employer]. This  information is not intended to replace advice given to you by your health care provider. Make sure you discuss any questions you have with your health care provider. ° °

## 2014-04-24 NOTE — Progress Notes (Signed)
Weight loss noted and she still has same GI sx, positive H pylori, will treat. US for growth

## 2014-04-24 NOTE — Progress Notes (Signed)
Follow up u/s for growth scheduled for 05/01/14 8:45

## 2014-05-01 ENCOUNTER — Other Ambulatory Visit: Payer: Self-pay | Admitting: Obstetrics & Gynecology

## 2014-05-01 ENCOUNTER — Ambulatory Visit (INDEPENDENT_AMBULATORY_CARE_PROVIDER_SITE_OTHER): Payer: Medicaid Other | Admitting: Family Medicine

## 2014-05-01 ENCOUNTER — Ambulatory Visit (HOSPITAL_COMMUNITY)
Admission: RE | Admit: 2014-05-01 | Discharge: 2014-05-01 | Disposition: A | Discharge status: Home / Self Care | Payer: Medicaid Other | Source: Ambulatory Visit | Attending: Obstetrics & Gynecology | Admitting: Obstetrics & Gynecology

## 2014-05-01 VITALS — BP 122/58 | HR 102 | Temp 97.6°F | Wt 156.3 lb

## 2014-05-01 DIAGNOSIS — O219 Vomiting of pregnancy, unspecified: Secondary | ICD-10-CM

## 2014-05-01 DIAGNOSIS — O9989 Other specified diseases and conditions complicating pregnancy, childbirth and the puerperium: Secondary | ICD-10-CM

## 2014-05-01 DIAGNOSIS — O99330 Smoking (tobacco) complicating pregnancy, unspecified trimester: Secondary | ICD-10-CM | POA: Insufficient documentation

## 2014-05-01 DIAGNOSIS — F1120 Opioid dependence, uncomplicated: Secondary | ICD-10-CM | POA: Insufficient documentation

## 2014-05-01 DIAGNOSIS — Z79899 Other long term (current) drug therapy: Secondary | ICD-10-CM | POA: Diagnosis not present

## 2014-05-01 DIAGNOSIS — O0993 Supervision of high risk pregnancy, unspecified, third trimester: Secondary | ICD-10-CM

## 2014-05-01 DIAGNOSIS — O0990 Supervision of high risk pregnancy, unspecified, unspecified trimester: Secondary | ICD-10-CM | POA: Insufficient documentation

## 2014-05-01 DIAGNOSIS — O99320 Drug use complicating pregnancy, unspecified trimester: Principal | ICD-10-CM

## 2014-05-01 DIAGNOSIS — O26893 Other specified pregnancy related conditions, third trimester: Secondary | ICD-10-CM

## 2014-05-01 DIAGNOSIS — O99323 Drug use complicating pregnancy, third trimester: Secondary | ICD-10-CM

## 2014-05-01 DIAGNOSIS — F1920 Other psychoactive substance dependence, uncomplicated: Secondary | ICD-10-CM | POA: Insufficient documentation

## 2014-05-01 DIAGNOSIS — R51 Headache: Secondary | ICD-10-CM

## 2014-05-01 LAB — POCT URINALYSIS DIP (DEVICE)
GLUCOSE, UA: NEGATIVE mg/dL
HGB URINE DIPSTICK: NEGATIVE
Nitrite: NEGATIVE
Protein, ur: 100 mg/dL — AB
SPECIFIC GRAVITY, URINE: 1.02 (ref 1.005–1.030)
Urobilinogen, UA: 1 mg/dL (ref 0.0–1.0)
pH: 8.5 — ABNORMAL HIGH (ref 5.0–8.0)

## 2014-05-01 MED ORDER — PROMETHAZINE HCL 12.5 MG PO TABS: 12.5000 mg | ORAL_TABLET | Freq: Four times a day (QID) | ORAL | Status: DC | PRN

## 2014-05-01 MED ORDER — BUTALBITAL-APAP-CAFFEINE 50-500-40 MG PO TABS: 1.0000 | ORAL_TABLET | Freq: Four times a day (QID) | ORAL | Status: DC | PRN

## 2014-05-01 MED ORDER — PROMETHAZINE HCL 12.5 MG PO TABS: 12.5000 mg | ORAL_TABLET | Freq: Four times a day (QID) | ORAL | Status: AC | PRN

## 2014-05-01 NOTE — Progress Notes (Signed)
Patient reports one episode of noting small amount of blood on tissue when wiping a few days ago.  Reports not being able to keep anything down in the last two days and experiencing the sharp bilateral upper abdominal pain (as bad as before gallbladder removal)

## 2014-05-01 NOTE — Progress Notes (Signed)
Patient is 33 y.o. [id number] 6577w2d.  +FM, denies LOF.  3 days ago blood spotting, contractions occasionally when on feet/standing/active.  Midepigastric pain has returned, nothing helps, worse with spicy/fried foods. - RF phenergan and fiorcet.   - Advised to avoid trigger foods, pain to be expected given cholecystectomy.  Taking regimen for h pylori.

## 2014-05-01 NOTE — Patient Instructions (Signed)
Papaya enzymes  Helicobacter Pylori Antibodies Test This is a blood test which looks for a germ called Helicobacter pylori. This can also be diagnosed by a breath test or a microscopic examination of a portion (biopsy) of the small bowel. H. pylori is a germ that is found in the cells that line the stomach. It is a risk factor for stomach and small bowel ulcers, long-standing inflammation of the lining of the stomach, or even ulcers that may occur in the esophagus (the canal that runs from the mouth to the stomach). This bacterium is also a factor in stomach cancer. The amount of the bacteria is found in about 10% of healthy persons younger than 34 years of age and the amount of the bacteria increases with age. Most persons with these bacteria have no symptoms; however, it is thought that when these bacteria cause ulcers, antibiotic medications can be used to help eliminate or reduce the problem.

## 2014-05-03 ENCOUNTER — Inpatient Hospital Stay (HOSPITAL_COMMUNITY)
Admission: AD | Admit: 2014-05-03 | Discharge: 2014-05-06 | DRG: 775 | Disposition: A | Discharge status: Home / Self Care | Payer: Medicaid Other | Source: Ambulatory Visit | Attending: Obstetrics & Gynecology | Admitting: Obstetrics & Gynecology

## 2014-05-03 DIAGNOSIS — Z833 Family history of diabetes mellitus: Secondary | ICD-10-CM

## 2014-05-03 DIAGNOSIS — F341 Dysthymic disorder: Secondary | ICD-10-CM | POA: Diagnosis present

## 2014-05-03 DIAGNOSIS — O4290 Premature rupture of membranes, unspecified as to length of time between rupture and onset of labor, unspecified weeks of gestation: Secondary | ICD-10-CM | POA: Diagnosis present

## 2014-05-03 DIAGNOSIS — O0993 Supervision of high risk pregnancy, unspecified, third trimester: Secondary | ICD-10-CM

## 2014-05-03 DIAGNOSIS — O99344 Other mental disorders complicating childbirth: Secondary | ICD-10-CM | POA: Diagnosis present

## 2014-05-03 DIAGNOSIS — O99334 Smoking (tobacco) complicating childbirth: Secondary | ICD-10-CM | POA: Diagnosis present

## 2014-05-04 ENCOUNTER — Encounter (HOSPITAL_COMMUNITY): Payer: Self-pay | Admitting: *Deleted

## 2014-05-04 ENCOUNTER — Encounter (HOSPITAL_COMMUNITY): Payer: Medicaid Other | Admitting: Anesthesiology

## 2014-05-04 ENCOUNTER — Inpatient Hospital Stay (HOSPITAL_COMMUNITY): Payer: Medicaid Other | Admitting: Anesthesiology

## 2014-05-04 DIAGNOSIS — F1920 Other psychoactive substance dependence, uncomplicated: Secondary | ICD-10-CM | POA: Diagnosis not present

## 2014-05-04 DIAGNOSIS — F341 Dysthymic disorder: Secondary | ICD-10-CM | POA: Diagnosis present

## 2014-05-04 DIAGNOSIS — O99334 Smoking (tobacco) complicating childbirth: Secondary | ICD-10-CM | POA: Diagnosis present

## 2014-05-04 DIAGNOSIS — O4290 Premature rupture of membranes, unspecified as to length of time between rupture and onset of labor, unspecified weeks of gestation: Secondary | ICD-10-CM | POA: Diagnosis present

## 2014-05-04 DIAGNOSIS — O99324 Drug use complicating childbirth: Secondary | ICD-10-CM

## 2014-05-04 DIAGNOSIS — O99344 Other mental disorders complicating childbirth: Secondary | ICD-10-CM | POA: Diagnosis present

## 2014-05-04 DIAGNOSIS — O4700 False labor before 37 completed weeks of gestation, unspecified trimester: Secondary | ICD-10-CM | POA: Diagnosis present

## 2014-05-04 DIAGNOSIS — Z833 Family history of diabetes mellitus: Secondary | ICD-10-CM | POA: Diagnosis not present

## 2014-05-04 LAB — OB RESULTS CONSOLE GBS: GBS: NEGATIVE

## 2014-05-04 LAB — POCT FERN TEST: POCT Fern Test: POSITIVE

## 2014-05-04 LAB — CBC
HEMATOCRIT: 33.9 % — AB (ref 36.0–46.0)
HEMOGLOBIN: 11.5 g/dL — AB (ref 12.0–15.0)
MCH: 29.8 pg (ref 26.0–34.0)
MCHC: 33.9 g/dL (ref 30.0–36.0)
MCV: 87.8 fL (ref 78.0–100.0)
Platelets: 257 10*3/uL (ref 150–400)
RBC: 3.86 MIL/uL — ABNORMAL LOW (ref 3.87–5.11)
RDW: 12.9 % (ref 11.5–15.5)
WBC: 15.6 10*3/uL — ABNORMAL HIGH (ref 4.0–10.5)

## 2014-05-04 LAB — RPR

## 2014-05-04 LAB — GROUP B STREP BY PCR: GROUP B STREP BY PCR: NEGATIVE

## 2014-05-04 LAB — SAMPLE TO BLOOD BANK

## 2014-05-04 MED ORDER — ZOLPIDEM TARTRATE 5 MG PO TABS: 5.0000 mg | ORAL_TABLET | Freq: Every evening | ORAL | Status: DC | PRN

## 2014-05-04 MED ORDER — DIBUCAINE 1 % RE OINT: 1.0000 "application " | TOPICAL_OINTMENT | RECTAL | Status: DC | PRN

## 2014-05-04 MED ORDER — ONDANSETRON HCL 4 MG/2ML IJ SOLN: 4.0000 mg | Freq: Four times a day (QID) | INTRAMUSCULAR | Status: DC | PRN

## 2014-05-04 MED ORDER — TERBUTALINE SULFATE 1 MG/ML IJ SOLN: 0.2500 mg | Freq: Once | INTRAMUSCULAR | Status: DC | PRN

## 2014-05-04 MED ORDER — CITRIC ACID-SODIUM CITRATE 334-500 MG/5ML PO SOLN: 30.0000 mL | ORAL | Status: DC | PRN

## 2014-05-04 MED ORDER — BUPRENORPHINE HCL 8 MG SL SUBL
8.0000 mg | SUBLINGUAL_TABLET | Freq: Every day | SUBLINGUAL | Status: DC
  Administered 2014-05-04: 4 mg via SUBLINGUAL
  Filled 2014-05-04: qty 1

## 2014-05-04 MED ORDER — OXYTOCIN 40 UNITS IN LACTATED RINGERS INFUSION - SIMPLE MED: 62.5000 mL/h | INTRAVENOUS | Status: DC

## 2014-05-04 MED ORDER — LANOLIN HYDROUS EX OINT: TOPICAL_OINTMENT | CUTANEOUS | Status: DC | PRN

## 2014-05-04 MED ORDER — OXYCODONE-ACETAMINOPHEN 5-325 MG PO TABS: 1.0000 | ORAL_TABLET | ORAL | Status: DC | PRN

## 2014-05-04 MED ORDER — LIDOCAINE HCL (PF) 1 % IJ SOLN
30.0000 mL | INTRAMUSCULAR | Status: DC | PRN
  Filled 2014-05-04: qty 30

## 2014-05-04 MED ORDER — FAMOTIDINE 20 MG PO TABS
20.0000 mg | ORAL_TABLET | Freq: Two times a day (BID) | ORAL | Status: DC | PRN
  Administered 2014-05-04: 20 mg via ORAL
  Filled 2014-05-04: qty 1

## 2014-05-04 MED ORDER — LACTATED RINGERS IV SOLN
INTRAVENOUS | Status: DC
  Administered 2014-05-04: 02:00:00 via INTRAVENOUS

## 2014-05-04 MED ORDER — TETANUS-DIPHTH-ACELL PERTUSSIS 5-2.5-18.5 LF-MCG/0.5 IM SUSP: 0.5000 mL | Freq: Once | INTRAMUSCULAR | Status: DC

## 2014-05-04 MED ORDER — PHENYLEPHRINE 40 MCG/ML (10ML) SYRINGE FOR IV PUSH (FOR BLOOD PRESSURE SUPPORT)
80.0000 ug | PREFILLED_SYRINGE | INTRAVENOUS | Status: DC | PRN
  Filled 2014-05-04: qty 2
  Filled 2014-05-04: qty 10

## 2014-05-04 MED ORDER — ACETAMINOPHEN 325 MG PO TABS: 650.0000 mg | ORAL_TABLET | ORAL | Status: DC | PRN

## 2014-05-04 MED ORDER — OXYCODONE-ACETAMINOPHEN 5-325 MG PO TABS
1.0000 | ORAL_TABLET | ORAL | Status: DC | PRN
  Administered 2014-05-05 – 2014-05-06 (×5): 1 via ORAL
  Administered 2014-05-06: 2 via ORAL
  Filled 2014-05-04 (×4): qty 1
  Filled 2014-05-04: qty 2

## 2014-05-04 MED ORDER — PROMETHAZINE HCL 25 MG PO TABS
12.5000 mg | ORAL_TABLET | Freq: Four times a day (QID) | ORAL | Status: DC | PRN
  Administered 2014-05-04: 25 mg via ORAL
  Filled 2014-05-04: qty 1

## 2014-05-04 MED ORDER — PROMETHAZINE HCL 25 MG PO TABS
25.0000 mg | ORAL_TABLET | Freq: Four times a day (QID) | ORAL | Status: DC | PRN
  Administered 2014-05-05: 25 mg via ORAL
  Filled 2014-05-04: qty 1

## 2014-05-04 MED ORDER — EPHEDRINE 5 MG/ML INJ
10.0000 mg | INTRAVENOUS | Status: DC | PRN
  Filled 2014-05-04: qty 2

## 2014-05-04 MED ORDER — ACETAMINOPHEN 500 MG PO TABS
1000.0000 mg | ORAL_TABLET | Freq: Once | ORAL | Status: AC
  Administered 2014-05-04: 1000 mg via ORAL
  Filled 2014-05-04: qty 2

## 2014-05-04 MED ORDER — PRENATAL MULTIVITAMIN CH
1.0000 | ORAL_TABLET | Freq: Every day | ORAL | Status: DC
  Administered 2014-05-05: 1 via ORAL
  Filled 2014-05-04: qty 1

## 2014-05-04 MED ORDER — LIDOCAINE HCL (PF) 1 % IJ SOLN
INTRAMUSCULAR | Status: DC | PRN
  Administered 2014-05-04 (×4): 4 mL

## 2014-05-04 MED ORDER — BENZOCAINE-MENTHOL 20-0.5 % EX AERO
1.0000 "application " | INHALATION_SPRAY | CUTANEOUS | Status: DC | PRN
  Administered 2014-05-04: 1 via TOPICAL
  Filled 2014-05-04 (×2): qty 56

## 2014-05-04 MED ORDER — OXYTOCIN 40 UNITS IN LACTATED RINGERS INFUSION - SIMPLE MED
1.0000 m[IU]/min | INTRAVENOUS | Status: DC
  Administered 2014-05-04: 2 m[IU]/min via INTRAVENOUS
  Filled 2014-05-04: qty 1000

## 2014-05-04 MED ORDER — PENICILLIN G POTASSIUM 5000000 UNITS IJ SOLR: 5.0000 10*6.[IU] | Freq: Once | INTRAVENOUS | Status: DC

## 2014-05-04 MED ORDER — BUTORPHANOL TARTRATE 1 MG/ML IJ SOLN: 2.0000 mg | INTRAMUSCULAR | Status: DC | PRN

## 2014-05-04 MED ORDER — IBUPROFEN 600 MG PO TABS
600.0000 mg | ORAL_TABLET | Freq: Four times a day (QID) | ORAL | Status: DC
  Administered 2014-05-04 – 2014-05-06 (×6): 600 mg via ORAL
  Filled 2014-05-04 (×6): qty 1

## 2014-05-04 MED ORDER — ERYTHROMYCIN 5 MG/GM OP OINT
TOPICAL_OINTMENT | OPHTHALMIC | Status: AC
  Filled 2014-05-04: qty 1

## 2014-05-04 MED ORDER — BUPRENORPHINE HCL 2 MG SL SUBL
4.0000 mg | SUBLINGUAL_TABLET | Freq: Two times a day (BID) | SUBLINGUAL | Status: DC
  Administered 2014-05-04 – 2014-05-06 (×4): 4 mg via SUBLINGUAL
  Filled 2014-05-04 (×5): qty 2

## 2014-05-04 MED ORDER — ONDANSETRON HCL 4 MG/2ML IJ SOLN: 4.0000 mg | INTRAMUSCULAR | Status: DC | PRN

## 2014-05-04 MED ORDER — WITCH HAZEL-GLYCERIN EX PADS: 1.0000 "application " | MEDICATED_PAD | CUTANEOUS | Status: DC | PRN

## 2014-05-04 MED ORDER — SIMETHICONE 80 MG PO CHEW
80.0000 mg | CHEWABLE_TABLET | ORAL | Status: DC | PRN
  Administered 2014-05-05: 80 mg via ORAL
  Filled 2014-05-04: qty 1

## 2014-05-04 MED ORDER — LACTATED RINGERS IV SOLN: 500.0000 mL | INTRAVENOUS | Status: DC | PRN

## 2014-05-04 MED ORDER — SENNOSIDES-DOCUSATE SODIUM 8.6-50 MG PO TABS
2.0000 | ORAL_TABLET | ORAL | Status: DC
  Administered 2014-05-04: 2 via ORAL
  Filled 2014-05-04 (×2): qty 2

## 2014-05-04 MED ORDER — PENICILLIN G POTASSIUM 5000000 UNITS IJ SOLR: 2.5000 10*6.[IU] | INTRAMUSCULAR | Status: DC

## 2014-05-04 MED ORDER — OXYTOCIN BOLUS FROM INFUSION
500.0000 mL | INTRAVENOUS | Status: DC
  Administered 2014-05-04: 500 mL via INTRAVENOUS

## 2014-05-04 MED ORDER — IBUPROFEN 600 MG PO TABS
600.0000 mg | ORAL_TABLET | Freq: Four times a day (QID) | ORAL | Status: DC | PRN
  Administered 2014-05-04: 600 mg via ORAL
  Filled 2014-05-04: qty 1

## 2014-05-04 MED ORDER — FENTANYL 2.5 MCG/ML BUPIVACAINE 1/10 % EPIDURAL INFUSION (WH - ANES)
14.0000 mL/h | INTRAMUSCULAR | Status: DC | PRN
  Administered 2014-05-04 (×2): 14 mL/h via EPIDURAL
  Filled 2014-05-04 (×2): qty 125

## 2014-05-04 MED ORDER — LACTATED RINGERS IV SOLN
500.0000 mL | Freq: Once | INTRAVENOUS | Status: AC
  Administered 2014-05-04: 500 mL via INTRAVENOUS

## 2014-05-04 MED ORDER — DIPHENHYDRAMINE HCL 50 MG/ML IJ SOLN: 12.5000 mg | INTRAMUSCULAR | Status: DC | PRN

## 2014-05-04 MED ORDER — DIPHENHYDRAMINE HCL 25 MG PO CAPS: 25.0000 mg | ORAL_CAPSULE | Freq: Four times a day (QID) | ORAL | Status: DC | PRN

## 2014-05-04 MED ORDER — PANTOPRAZOLE SODIUM 40 MG PO TBEC
40.0000 mg | DELAYED_RELEASE_TABLET | Freq: Every day | ORAL | Status: DC
  Administered 2014-05-04 – 2014-05-06 (×3): 40 mg via ORAL
  Filled 2014-05-04 (×3): qty 1

## 2014-05-04 MED ORDER — ONDANSETRON HCL 4 MG PO TABS
4.0000 mg | ORAL_TABLET | ORAL | Status: DC | PRN
  Administered 2014-05-05: 4 mg via ORAL

## 2014-05-04 MED ORDER — PHENYLEPHRINE 40 MCG/ML (10ML) SYRINGE FOR IV PUSH (FOR BLOOD PRESSURE SUPPORT)
80.0000 ug | PREFILLED_SYRINGE | INTRAVENOUS | Status: DC | PRN
  Filled 2014-05-04: qty 2

## 2014-05-04 NOTE — MAU Note (Signed)
Water broke at 9pm went to [facility] confirmed SROM and told her she was 1 cm dilated. Was going to transfer via ambulance to [facility] but Husband did not want to wait and drove her here himself. Pt state she is having ctx not sure how far apart they are.

## 2014-05-04 NOTE — Anesthesia Preprocedure Evaluation (Signed)
Anesthesia Evaluation  Patient identified by MRN, date of birth, ID band Patient awake    Reviewed: Allergy & Precautions, H&P , NPO status , Patient's Chart, lab work & pertinent test results, reviewed documented beta blocker date and time   History of Anesthesia Complications Negative for: history of anesthetic complications  Airway Mallampati: I TM Distance: >3 FB Neck ROM: full    Dental  (+) Teeth Intact   Pulmonary neg pulmonary ROS, Current Smoker,  breath sounds clear to auscultation        Cardiovascular negative cardio ROS  Rhythm:regular Rate:Normal     Neuro/Psych  Headaches, Anxiety Depression    GI/Hepatic Neg liver ROS, (+)     substance abuse (h/o - on subutex)  , H. Pylori - on multiple meds   Endo/Other  negative endocrine ROS  Renal/GU negative Renal ROS     Musculoskeletal   Abdominal   Peds  Hematology negative hematology ROS (+)   Anesthesia Other Findings   Reproductive/Obstetrics (+) Pregnancy                           Anesthesia Physical Anesthesia Plan  ASA: II  Anesthesia Plan: Epidural   Post-op Pain Management:    Induction:   Airway Management Planned:   Additional Equipment:   Intra-op Plan:   Post-operative Plan:   Informed Consent: I have reviewed the patients History and Physical, chart, labs and discussed the procedure including the risks, benefits and alternatives for the proposed anesthesia with the patient or authorized representative who has indicated his/her understanding and acceptance.     Plan Discussed with:   Anesthesia Plan Comments:         Anesthesia Quick Evaluation

## 2014-05-04 NOTE — Anesthesia Procedure Notes (Signed)
Epidural Patient location during procedure: OB Start time: 05/04/2014 10:36 AM  Staffing Performed by: anesthesiologist   Preanesthetic Checklist Completed: patient identified, site marked, surgical consent, pre-op evaluation, timeout performed, IV checked, risks and benefits discussed and monitors and equipment checked  Epidural Patient position: sitting Prep: site prepped and draped and DuraPrep Patient monitoring: continuous pulse ox and blood pressure Approach: midline Injection technique: LOR air  Needle:  Needle type: Tuohy  Needle gauge: 17 G Needle length: 9 cm and 9 Needle insertion depth: 4.5 cm Catheter type: closed end flexible Catheter size: 19 Gauge Catheter at skin depth: 9.5 cm Test dose: negative  Assessment Events: blood not aspirated, injection not painful, no injection resistance, negative IV test and no paresthesia  Additional Notes Discussed risk of headache, infection, bleeding, nerve injury and failed or incomplete block.  Patient voices understanding and wishes to proceed.  Epidural placed easily on first attempt.  No paresthesia.  Patient tolerated procedure well with no apparent complications.  Jasmine December, MDReason for block:procedure for pain

## 2014-05-04 NOTE — Progress Notes (Signed)
Pt requesting her other half of Subutex. The nurse from day shift charted pt only took half and the other half was thrown in trash. RN called day RN to verify where the other half was disposed of. Scheryl Marten day RN said she threw it in the red sharps box in room.

## 2014-05-04 NOTE — Progress Notes (Signed)
[name] [name] is a 33 y.o. G3P1011 at [redacted]w[redacted]d.  Subjective: Mild UC's  Objective: BP 94/49  Pulse 81  Temp(Src) 97.9 F (36.6 C) (Oral)  Resp 20  Ht  (1.676 m)  Wt 69.854 kg (154 lb)  BMI 24.87 kg/m2  LMP 08/27/2013      FHT:  FHR: 120 bpm, variability: moderate,  accelerations:  Present,  decelerations:  Present ? variables vs intermittent MHR tracing. Monitor adjusted. UC:   irregular SVE:   Dilation: 2 Effacement (%): 50 Station: -2;-3 Exam by:: A. Beckemeyer, [facility]  Labs: Lab Results  Component Value Date   WBC 15.6* 05/04/2014   HGB 11.5* 05/04/2014   HCT 33.9* 05/04/2014   MCV 87.8 05/04/2014   PLT 257 05/04/2014    Assessment / Plan: Induction of labor due to PROM, on pitocin  Labor: Progressing normally Preeclampsia:  NA Fetal Wellbeing:  Category I and Category II Pain Control:  Labor support without medications I/D:  n/a Anticipated MOD:  NSVD  [name] [name] 05/04/2014, 4:27 AM

## 2014-05-04 NOTE — Progress Notes (Addendum)
[name] [name] is a 33 y.o. G3P1011 at [redacted]w[redacted]d.  Subjective: Comfortable with epidural, requesting something for headache  Objective: BP 115/64  Pulse 97  Temp(Src) 98.1 F (36.7 C) (Oral)  Resp 20  Ht  (1.676 m)  Wt 154 lb (69.854 kg)  BMI 24.87 kg/m2  SpO2 100%  LMP 08/27/2013      FHT:  FHR: 120 bpm, variability: moderate,  accelerations:  Present,  decelerations: none, Monitor adjusted.  Had prolonged decel with epidural but none since. UC:   irregular SVE:   Dilation: 8.5 Effacement (%): 90 Station: 0 Exam by:: dr Loreta Ave  Labs: Lab Results  Component Value Date   WBC 15.6* 05/04/2014   HGB 11.5* 05/04/2014   HCT 33.9* 05/04/2014   MCV 87.8 05/04/2014   PLT 257 05/04/2014    Assessment / Plan: Induction of labor due to PROM ([id number]), on pitocin  Labor: Progressing normally Preeclampsia:  NA Fetal Wellbeing:  Category I Pain Control:  Epidural, tylenol for headache I/D:  n/a Anticipated MOD:  NSVD  [name] [name] [name] 05/04/2014, 4:09 PM

## 2014-05-04 NOTE — H&P (Signed)
[name] [name] is a 33 y.o. female G3P1011 with IUP at [redacted]w[redacted]d presenting for ROM. She report her water broke at 9pm tonight. She has noticed some irregular ctx and loss of mucus plug and continues to note good FM.   Prenatal History/Complications:   Cholecystectomy in 2nd trimester  On Subutex; Hx of drug abuse   Clinic [facility]  Genetic Screen  First screen--normal   AFP--nml  Anatomic [place]  19 wks nml, poor visualization > repeat 4 wks > nml (face still limited, will do with growth)  Glucose Screen 88  TDaP vaccine  02/27/2014  Flu vaccine  Decline  CF Screen NA  GBS Unknown  Baby Food  breast  Contraception  undecided  Circumcision  recommend as outpatient   Past Medical History: Past Medical History  Diagnosis Date  . Narcotic abuse   . Anxiety   . Depression   . Chronic headaches   . H. pylori infection   . UTI (lower urinary tract infection)   . Vaginal Pap smear, abnormal     has not had follow up    Past Surgical History: Past Surgical History  Procedure Laterality Date  . Abortions    . Wisdom tooth extraction    . Cholecystectomy N/A 11/21/2013    Procedure: LAPAROSCOPIC CHOLECYSTECTOMY ;  Surgeon: Axel Filler, MD;  Location: [facility];  Service: General;  Laterality: N/A;  . Esophagogastroduodenoscopy N/A 12/12/2013    Procedure: ESOPHAGOGASTRODUODENOSCOPY (EGD);  Surgeon: Rachael Fee, MD;  Location: [facility];  Service: Endoscopy;  Laterality: N/A;    Obstetrical History: OB History   Grav Para Term Preterm Abortions TAB SAB Ect Mult Living   Social History: History   Social History  . Marital Status: Single    Spouse Name: N/A    Number of Children: 1  . Years of Education: N/A   Occupational History  . Nanny    Social History Main Topics  . Smoking status: Current Every Day Smoker -- 1.00 packs/day for 15 years    Types: Cigarettes  . Smokeless tobacco: Never Used     Comment: Patient has cut back to 1 pack daily from  2 packs daily  . Alcohol Use: No     Comment: recovering addict  . Drug Use: No     Comment: recovering addict  . Sexual Activity: Yes    Birth Control/ Protection: None     Comment: over two yrs ago   Other Topics Concern  . None   Social History Narrative   The patient is single, one daughter, employed as a Social worker    Family History: Family History  Problem Relation Age of Onset  . Diabetes Mother   . Cancer Maternal Aunt   . Cancer Maternal Uncle   . Cancer Paternal Aunt   . Cancer Paternal Uncle   . Hearing loss Neg Hx     Allergies: No Known Allergies  Prescriptions prior to admission  Medication Sig Dispense Refill  . amoxicillin (AMOXIL) 500 MG capsule Take 2 capsules (1,000 mg total) by mouth 2 (two) times daily.  40 capsule  0  . buprenorphine (SUBUTEX) 8 MG SUBL SL tablet Place 8 mg under the tongue daily.      . butalbital-acetaminophen-caffeine (ESGIC PLUS) 50-500-40 MG per tablet Take 1-2 tablets by mouth every 6 (six) hours as needed for pain or headache.  15 tablet  0  .  clarithromycin (BIAXIN) 500 MG tablet Take 1 tablet (500 mg total) by mouth 2 (two) times daily.  20 tablet  0  . docusate sodium (COLACE) 100 MG capsule Take 1 capsule (100 mg total) by mouth 2 (two) times daily as needed.  30 capsule  2  . metoCLOPramide (REGLAN) 10 MG tablet Take 1 tablet (10 mg total) by mouth 4 (four) times daily.  60 tablet  2  . omeprazole (PRILOSEC) 40 MG capsule Take 1 capsule (40 mg total) by mouth 2 (two) times daily.  60 capsule  1  . Prenatal Multivit-Min-Fe-FA (PRENATAL VITAMINS) 0.8 MG tablet Take 1 tablet by mouth daily.  30 tablet  12  . Prenatal Vit-Fe Fumarate-FA (PRENATAL MULTIVITAMIN) TABS tablet Take 1 tablet by mouth daily at 12 noon.      . prenatal vitamin w/FE, FA (NATACHEW) 29-1 MG CHEW chewable tablet Chew 1 tablet by mouth daily at 12 noon.  30 tablet  5  . promethazine (PHENERGAN) 12.5 MG tablet Take 1 tablet (12.5 mg total) by mouth every 6 (six)  hours as needed for nausea or vomiting.  30 tablet  0  . ranitidine (ZANTAC) 150 MG tablet Take 1 tablet (150 mg total) by mouth daily.  30 tablet  2  . sucralfate (CARAFATE) 1 G tablet Take 1 tablet (1 g total) by mouth 4 (four) times daily -  with meals and at bedtime.  60 tablet  2    Review of Systems: Negative unless otherwise stated in History above  Physicial Last menstrual period 08/27/2013. General appearance: alert, cooperative and mild distress Lungs: clear to auscultation bilaterally Heart: regular rate and rhythm Abdomen: soft, non-tender; bowel sounds normal Presentation: cephalic Fetal monitoringBaseline: 135 bpm, Variability: Good {> 6 bpm), Accelerations: Reactive and Decelerations: Absent Uterine activity: Irregular Dilation: 2 Effacement (%): 50 Station: -3 Exam by:: K.Wilson,RN  Prenatal labs: ABO, Rh: A/POS/-- (02/25 1317) Antibody: NEG (02/25 1317) Rubella:   RPR: NON REAC (06/18 1339)  HBsAg: NEGATIVE (02/25 1317)  HIV: NONREACTIVE (06/18 1339)  GBS:    GTT: wnl  EFW 19% 05/01/14. Normal dopplers  Prenatal Transfer Tool  Maternal Diabetes: No Genetic Screening: Normal Maternal Ultrasounds/Referrals: Normal Fetal Ultrasounds or other Referrals:  None Maternal Substance Abuse:  Yes:  Type: Other: Now on Subutex Significant Maternal Medications:  Meds include: Other:  Significant Maternal Lab Results: None GBS pending  Results for orders placed during the hospital encounter of 05/03/14 (from the past 24 hour(s))  POCT FERN TEST   Collection Time    05/04/14 12:50 AM      Result Value Ref Range   POCT Fern Test Positive = ruptured amniotic membanes      Assessment: [name] [name] is a 33 y.o. G3P1011 at [redacted]w[redacted]d by here for PROM with irregular contraction.   #Labor: Admit to birthing suite for augmentation #Pain: Labor support #FWB: Cat 1  #ID:  GBS unknown - PCN #Feeding: breast #MOC:undecided #Circ:  As outpatient  Wenda Low MD [facility] PGY-2 05/04/2014, 1:33 AM  I was consulted RE:POC and agree with above.  [place], CNM 05/04/2014 5:10 AM

## 2014-05-05 ENCOUNTER — Encounter (HOSPITAL_COMMUNITY): Payer: Self-pay | Admitting: *Deleted

## 2014-05-05 LAB — CBC
HCT: 33.3 % — ABNORMAL LOW (ref 36.0–46.0)
Hemoglobin: 11.1 g/dL — ABNORMAL LOW (ref 12.0–15.0)
MCH: 29.8 pg (ref 26.0–34.0)
MCHC: 33.3 g/dL (ref 30.0–36.0)
MCV: 89.3 fL (ref 78.0–100.0)
Platelets: 229 10*3/uL (ref 150–400)
RBC: 3.73 MIL/uL — ABNORMAL LOW (ref 3.87–5.11)
RDW: 13.2 % (ref 11.5–15.5)
WBC: 18.4 10*3/uL — ABNORMAL HIGH (ref 4.0–10.5)

## 2014-05-05 MED ORDER — BUTALBITAL-APAP-CAFFEINE 50-500-40 MG PO TABS: 1.0000 | ORAL_TABLET | Freq: Four times a day (QID) | ORAL | Status: DC | PRN

## 2014-05-05 MED ORDER — METOCLOPRAMIDE HCL 10 MG PO TABS
10.0000 mg | ORAL_TABLET | Freq: Two times a day (BID) | ORAL | Status: DC
  Administered 2014-05-05 – 2014-05-06 (×2): 10 mg via ORAL
  Filled 2014-05-05 (×2): qty 1

## 2014-05-05 MED ORDER — AMOXICILLIN 500 MG PO CAPS
1000.0000 mg | ORAL_CAPSULE | Freq: Two times a day (BID) | ORAL | Status: DC
  Administered 2014-05-05 – 2014-05-06 (×3): 1000 mg via ORAL
  Filled 2014-05-05 (×5): qty 2

## 2014-05-05 MED ORDER — BUTALBITAL-APAP-CAFFEINE 50-325-40 MG PO TABS
1.0000 | ORAL_TABLET | Freq: Four times a day (QID) | ORAL | Status: DC | PRN
  Administered 2014-05-05 – 2014-05-06 (×4): 1 via ORAL
  Filled 2014-05-05 (×4): qty 1

## 2014-05-05 MED ORDER — CLARITHROMYCIN 500 MG PO TABS
500.0000 mg | ORAL_TABLET | Freq: Two times a day (BID) | ORAL | Status: DC
  Administered 2014-05-05 – 2014-05-06 (×3): 500 mg via ORAL
  Filled 2014-05-05 (×5): qty 1

## 2014-05-05 NOTE — Lactation Note (Signed)
This note was copied from the chart of [name] [name]. Lactation Consultation Note Mom set up w/DEBP and is pumping on preemie setting, set up and cleaning demonstrated. Mom pumping brownish colostrum, explained about "rusty pipe syndrome" Labels given for #'s and collection vials, as well as stickers w/baby's name. Mom on subutex, baby went to NICU d/t low POTC. NICU booklet given. Explained LPI care and gave information sheet. Mom in a lot of abd. Cramping pain and tired. FOB at bedside and supportive. Hand expression taught to Mom. WH/LC brochure given w/resources, support groups and LC services.Referred to Baby and Me Book in Breastfeeding section Pg. 22-23 for position options and Proper latch demonstration.Encouraged comfort during BF so colostrum flows better and mom will enjoy the feeding longer. Taking deep breaths and breast massage during BF. Mom encouraged to do skin-to-skin when visiting baby in NICU, kangaroo care. Mom knows to pump q3h for 15-20 min. On preemie setting. Explained the importance of pumping every three hrs. And feeding the baby on the breast as much as possible and post-pumping afterwards to stimulate milk supply. Patient Name: [name] [name] [id number] Date: 05/05/2014     Maternal Data    Feeding    LATCH Score/Interventions                      Lactation Tools Discussed/Used     Consult Status      Charyl Dancer 05/05/2014, 3:32 AM

## 2014-05-05 NOTE — Progress Notes (Signed)
Patient called out for pain medicine.  Assessed her and she also c/o pain in her stomach and wanted to know what medicines she could take.  Reglan and Simethicone given.  Patient also c/o nausea.  Phenergan given.

## 2014-05-05 NOTE — Progress Notes (Signed)
FOB came to desk and asked for someone to come in and help with the "pumping machine".  I went in to check on the patient and she was moaning and holding her breath while using the electric breast pump.  I repositioned the flange and the patient continued to moan and starting crying saying she was cramping so bad "just like contractions".  I assured the patient that this was normal and that I had given her pain medicine that will assist with her pain.  She stated that she was okay and continued to moan as I walked out the door.

## 2014-05-05 NOTE — Progress Notes (Signed)
Patient has requested that I ask the doctor to order "all my stomach meds".  I spoke to Dr. Loreta Ave to add Reglan to her orders, received order.

## 2014-05-05 NOTE — Progress Notes (Signed)
UR chart review completed.  

## 2014-05-05 NOTE — Progress Notes (Signed)
Post Partum Day 1 Subjective:  [name] [name] is a 33 y.o. U9W1191 [redacted]w[redacted]d.  No acute events overnight.  Pt denies problems with ambulating, voiding or po intake.  She denies nausea or vomiting.  Pain is moderately controlled.  She has had flatus. Lochia Moderate.  Is pumping while baby in NICU, also supplementing.  Reporting severe cramping while pumping.  Objective: Blood pressure 102/58, pulse 69, temperature 98 F (36.7 C), temperature source Oral, resp. rate 16, height  (1.676 m), weight 154 lb (69.854 kg), last menstrual period 08/27/2013, SpO2 100.00%, unknown if currently breastfeeding.  Physical Exam:  General: alert, cooperative and no distress Lochia:normal flow Chest: CTAB Heart: RRR no m/r/g Abdomen: +BS, soft, nontender,  Uterine Fundus: firm DVT Evaluation: No evidence of DVT seen on physical exam. Extremities: no edema   Recent Labs  05/04/14 0155 05/05/14 0630  HGB 11.5* 11.1*  HCT 33.9* 33.3*    Assessment/Plan:  ASSESSMENT: [name] [name] is a 33 y.o. Y7W2956 [redacted]w[redacted]d s/p SVD - restart meds for h pylori - restart fiorcet for migraine - pain meds prn cramping   LOS: 2 days   Osric Klopf [name] 05/05/2014, 1:55 PM

## 2014-05-05 NOTE — Lactation Note (Signed)
This note was copied from the chart of [name] Peta Peachey. Lactation Consultation Note Assisted mom with hand expression and she was able to express a small amount of blood tinged colostrum.  Her breasts are tender so we stopped and she will try again later.  We also reviewed how to express using the pump.  Follow-up as needed.   Patient Name: [name] [name] [id number] Date: 05/05/2014     Maternal Data    Feeding    LATCH Score/Interventions                      Lactation Tools Discussed/Used     Consult Status      Soyla Dryer 05/05/2014, 3:51 PM

## 2014-05-05 NOTE — Anesthesia Postprocedure Evaluation (Signed)
Anesthesia Post Note  Patient: [name] [name]  Procedure(s) Performed: * No procedures listed *  Anesthesia type: Epidural  Patient location: Mother/Baby  Post pain: Pain level controlled  Post assessment: Post-op Vital signs reviewed  Last Vitals:  Filed Vitals:   05/05/14 0742  BP: 102/58  Pulse: 69  Temp: 36.7 C  Resp: 16    Post vital signs: Reviewed  Level of consciousness:alert  Complications: No apparent anesthesia complications

## 2014-05-06 ENCOUNTER — Ambulatory Visit: Payer: Self-pay

## 2014-05-06 MED ORDER — IBUPROFEN 600 MG PO TABS: 600.0000 mg | ORAL_TABLET | Freq: Four times a day (QID) | ORAL | Status: AC

## 2014-05-06 NOTE — Discharge Summary (Signed)
Patient seen and evaluated with the resident and deemed appropriate for discharge.

## 2014-05-06 NOTE — Progress Notes (Signed)
Discussed discharge instructions with patient.  She was also filling out papers for a breast pump rental at the time.  She was able to repeat back and ask questions regarding her discharge.  No further questions at this time.

## 2014-05-06 NOTE — Discharge Instructions (Signed)

## 2014-05-06 NOTE — Discharge Summary (Signed)
Obstetric Discharge Summary Reason for Admission: onset of labor Prenatal Procedures: none Intrapartum Procedures: spontaneous vaginal delivery Postpartum Procedures: none Complications-Operative and Postpartum: none Hemoglobin  Date Value Ref Range Status  05/05/2014 11.1* 12.0 - 15.0 g/dL Final     HCT  Date Value Ref Range Status  05/05/2014 33.3* 36.0 - 46.0 % Final    Discharge Diagnoses: Term Pregnancy-delivered  Hospital Course:  [name] [name] is a 33 y.o. [id number] who presented with SOL.  She had a uncomplicated SVD. She was able to ambulate, tolerate PO and void normally. She was discharged home with instructions for postpartum care.  The neonate was in the NICU on the day of discharge.  Delivery Note At 6:40 PM a viable female was delivered via Vaginal, Spontaneous Delivery (Presentation: ;  ).  APGAR: 9, 9; weight 4 lb 14 oz (2211 g).   Placenta status: Intact, Spontaneous.  Cord: 3 vessels with the following complications: None.  Cord pH: pending  Anesthesia: Epidural  Episiotomy: None Lacerations: Labial Suture Repair: N/A Est. Blood Loss (mL): 150  Mom to postpartum.  Baby to Couplet care / Skin to Skin.   Physical Exam:  General: alert, cooperative and no distress Lochia: appropriate Uterine Fundus: firm DVT Evaluation: No evidence of DVT seen on physical exam. Negative Homan's sign. No cords or calf tenderness.  Discharge Information: Date: 05/06/2014 Activity: pelvic rest Diet: routine Medications: PNV and Ibuprofen Baby feeding: plans to breastfeed Contraception: Undecided Condition: stable Instructions: refer to practice specific booklet Discharge to: home   Newborn Data: Live born female  Birth Weight: 4 lb 14 oz (2211 g) APGAR: 9, 9  Home with mother.  Rodrigo Ran, MD [facility] PGY-1 05/06/2014, 8:37 AM

## 2014-05-06 NOTE — Lactation Note (Signed)
This note was copied from the chart of [name] Thersa Mohiuddin. Lactation Consultation Note  Assisted mother with a loaner WIC pump.   Reviewed with mother to pump every 3 hours, massaging and expressing first. Mother tearful.  Not wanting to leave baby.  Supported mother.  Patient Name: [name] [name] [id number] Date: 05/06/2014     Maternal Data    Feeding Feeding Type: Formula Length of feed: 30 min  LATCH Score/Interventions                      Lactation Tools Discussed/Used     Consult Status      Hardie Pulley 05/06/2014, 4:10 PM

## 2014-05-06 NOTE — Lactation Note (Signed)
This note was copied from the chart of Boy Parthena Fergeson. Lactation Consultation Note     Follow up consult with this mom, being discharged today, with baby in NICU, now 40 hours post partum, and 36 weeks CGA. [place] sur[place], and will need to transfer her WIC from [place]. Verdis Frederickson., LC, loaned mom a DEP. Mom and baby will be followed both in NICU and and in o/p prn.  Patient Name: Boy Ocean Kearley ZOXWR'U Date: 05/06/2014 Reason for consult: Follow-up assessment;Late preterm infant;NICU baby;Infant < 6lbs   Maternal Data Formula Feeding for Exclusion: Yes (baby in NICU) Has patient been taught Hand Expression?: Yes Does the patient have breastfeeding experience prior to this delivery?: No  Feeding Feeding Type: Formula Length of feed: 30 min  LATCH Score/Interventions                      Lactation Tools Discussed/Used WIC Program: Yes (mom spoke to [place] [facility]x, [place] WIC, and was told she needs tyo transfer her WI[facility] to [facility], which is where she is now living. she did loan a DEP on discharge today.) Pump Review: Setup, frequency, and cleaning;Milk Storage;Other (comment) (hand expression reviewed with mom )   Consult Status Consult Status: PRN Follow-up type: In-patient (NICU)    Alfred Levins 05/06/2014, 5:15 PM

## 2014-05-06 NOTE — Progress Notes (Signed)
Clinical Social Work Department PSYCHOSOCIAL ASSESSMENT - MATERNAL/CHILD 05/06/2014  Patient:  [name] [name],[name] [name]  Account Number:  [id number]  Admit Date:  05/03/2014  Childs Name:   [name] [name]    Clinical Social Worker:  Rhianne Soman, LCSW   Date/Time:  05/06/2014 01:30 PM  Date Referred:  05/04/2014   Referral source  NICU     Referred reason  Behavioral Health Issues  Substance Abuse   Other referral source:    I:  FAMILY / HOME ENVIRONMENT Child's legal guardian:  PARENT  Guardian - Name Guardian - Age Guardian - Address  [name] [name] 33 [address], [place], [place] [address]  [name] [name]  same   Other household support members/support persons Name Relationship DOB  [name] "Bella" DAUGHTER 11  [name] [name] SON 12  [name] [name] DAUGHTER 9  [name] [name] DAUGHTER 6   Other support:   MOB's stepmother was visiting with parents today, but was traveling back to [place] today.  She states she will be back to visit soon.  FOB's mother is caring for Bella while they are in the hospital and lives next door.  Bella is MOB's biological daughter, but FOB states he thinks or her as his own.  The other 3 children are FOB's biological children. FOB states he shares custody with their mothers (2).    II  PSYCHOSOCIAL DATA Information Source:  Family Interview  Financial and Community Resources Employment:   FOB works for [employer] in [place], driving a tractor trailer. He states he will return to work tomorrow night, after taking two days off, and then will have some time off when baby goes home.   Financial resources:  Medicaid If Medicaid - County:    School / Grade:   Maternity Care Coordinator / Child Services Coordination / Early Interventions:   CC4C  Cultural issues impacting care:   None stated    III  STRENGTHS Strengths  Adequate Resources  Home prepared for Child (including basic supplies)  Other - See comment  Supportive family/friends   Strength comment:   FOB states pediatric follow up will either be at [facility] or [facility].  Parents will decide and inform NICU staff.   IV  RISK FACTORS AND CURRENT PROBLEMS Current Problem:  YES   Risk Factor & Current Problem Patient Issue Family Issue Risk Factor / Current Problem Comment  Adjustment to Illness Y Y Parents are having difficulty accepting baby's admission to NICU and the fact that MOB must be discharged today.  Mental Illness Y N MOB's chart notes hx of Anx/Dep   N N     V  SOCIAL WORK ASSESSMENT  CSW met with MOB and FOB in MOB's first floor room to introduce myself, offer support and complete assessment due to baby's admission to NICU and MOB's hx of substance use and dep/anx.  MOB's stepmother was present when CSW arrived and MOB stated that we could discuss anything in front of her and FOB.  Parents were open to talking with CSW, but clearly upset with the fact that MOB was having to discharge today and leave baby in the hospital.  They live in [place], which they state is 1.5 hours from the hospital.  FOB reports he will be sleeping in his car because he refuses to be that far from his child.  CSW encouraged him to rethink this plan, as he needs to get rest and take care of himself.  MOB also needs to rest and   recuperate after having a baby, and the car is not a suitable place to do this.  He stated understanding and informed CSW that MOB would not be staying in the car.  CSW added that MOB needs him for support during this time.  MOB agreed.  CSW asked if they have any family or friends who live closer to the hospital.  MOB states her brother lives in [place], about 8 minutes from the hospital.  FOB commented that he did not want to "stay in another man's home."  CSW will allow family to work this out between them, but notes that they have acknowledged that they have a place to stay near the hospital.  [facility] staff has also given them 2 gas cards  ($20) at this time to assist with traveling.  Stepmother states she does not understand why MOB has to be discharged today, especially given that it is already the afternoon.  CSW explained that a patient must have a medical reason to be a patient in the hospital and explained that each new day changes at midnight.  CSW acknowledged the difficulty of leaving a baby in the hospital, but discussed that it is necessary for baby to be in intensive care at this time.  CSW inquired about their decision to deliver 1.5 hours away from their home and learned that MOB received her PNC in [place] because they lived here prior to moving to [place] a few weeks ago.  They also knew that baby would need a NICU because MOB's water broke prematurely and because MOB is on Subutex.  CSW discussed this in more detail.  MOB informed CSW of her pain associated with her gall bladder and how she had to have emergency gall bladder surgery in later March.  She states she still has pain and that the H Pylori infection has returned, for which she is now back on antibiotics.  She states she started taking Percocet for the pain associated with both of these things even though she did not want to be on pain medication during pregnancy.  (CSW notes documentation in medical record of MOB's concerns of taking pain medication while pregnant.)  She states her OB told her she was putting her body and the baby through more stress by being in so much pain than by taking pain medication.  She states she was needing so much pain medication that she eventually switched to Subutex at some point during the pregnancy.  She states this has controlled her pain for the most part.  She sees Dr. Corrington monthly for her Subutex rx.  CSW inquired about the narcotic abuse hx listed in her medical record.  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  CSW  addressed her feelings of having to take pain medication again and MOB states she is not afraid of becoming addicted again.  Her stepmother interjected that MOB has an incredible support system and that no one is fearful of her going down that road again.  CSW commended her for her sobriety and asked how she achieved this.  MOB states she enrolled in [facility] tx center in [place].  CSW discussed common emotions related to the NICU admission as well as signs and symptoms of PPD.  CSW asked if MOB had PPD with her first child or any hx of Anxiety of Depression.    MOB denies any hx, however, a hx is documented in her chart.  CSW did not press this issue, but stressed the importance of monitoring for signs and symptoms of PPD and ensured that family knew that PPD is common, especially when given added stress factors, such as a NICU admission.  CSW explained ongoing support services.  Parents admit feeling very sad about leaving baby.  FOB became tearful and looked away so not to let his tears fall.  MOB cried when she talked about leaving baby in the hospital.  CSW encouraged parents to allow themselves to be emotional and discussed that leaving their baby is not normal, but is necessary.  CSW encouraged them not to feel guilty about leaving their baby, as some parents do, because they do not have a choice.  CSW stressed that this situation is temporary, even though it will feel like eternity right now.  CSW also encouraged parents not to focus solely on when baby will be discharged, because they will risk missing out on this essential time of bonding with baby.  CSW explained this as their unique birth story and encouraged them to embrace it and find the positives in their situation.  Parents seemed to be appreciative of the conversation.  CSW provided contact information.  Assessment seemed to be completed and CSW was leaving when FOB spoke up about his feelings regarding baby's actual time of transfer  from CN to NICU.  He states like he felt like a distant friend of the family, not baby's father, when he walked with the RN to the NICU.  He states that he felt he and MOB were looked at differently because his baby was referred to by the RN escorting them as "the Subutex baby."  He added that maybe he was being too sensitive, but that he did not like how this made him feel.  CSW apologized for his experience and thanked him for sharing his feelings.  CSW encouraged him to continue talking about and processing how he is feeling throughout this experience, as this assists in the healing process.  CSW asked them to call CSW any time they feel they would like to talk.  They thanked CSW.     VI SOCIAL WORK PLAN Social Work Plan  Patient/Family Education  Psychosocial Support/Ongoing Assessment of Needs   Type of pt/family education:   PPD signs and symptoms/common emotions related to the NICU experience  Ongoing support services offered by NICU CSW   If child protective services report - county:   If child protective services report - date:   Information/referral to community resources comment:   No referral needs identified at this time.   Other social work plan:     

## 2014-05-08 ENCOUNTER — Telehealth: Payer: Self-pay

## 2014-05-08 NOTE — Telephone Encounter (Signed)
Patient called stating she needs a refill of her Fioricet for headaches and would also like to schedule an appointment with Dr. Debroah Loop because she wants to talk about issues since she has delivered. Called patient and informed her I would discuss refill with Dr. Debroah Loop, however, would like to know what her questions and concerns are. Patient states her headaches have gotten worse and that is why she needs the Fioricet but she has also noticed the her feet and ankles have been swelling "really bad." States "I can feel it jiggle when I walk." Also, c/o of mucous like discharge when she wipes. Also c/o of "huge knot" on breast. Asked patient if it was red, warm, tender, or if she had a fever. She reports it pink and tender but not warm to the touch nor does she feel like she has a fever. Patient is breastfeeding. Discussed concerns with Dr. Debroah Loop who agreed patient should go to [facility] to be evaluated. Informed patient of the need to go to [facility] to be evaluated, most importantly to ensure breast tenderness is not the start of mastitis. Patient verbalized understanding and stated she would.

## 2014-05-09 ENCOUNTER — Ambulatory Visit: Payer: Self-pay

## 2014-05-09 NOTE — Lactation Note (Signed)
This note was copied from the chart of [name] [name]. Lactation Consultation Note    Follow up consult with this mom of a LPT baby, now 55 days old, and 36 2/7 weeks CGA. Mom is c/o a tender , hard knot in her upper right brest. I assisted mom with pumping and massage, she has a good milk supply. After pumping, the knot felt smaller, but still hard and tender. Mom has no redness, or signs and symptoms of feeling ill.  I advised her to pump every 2-3 hours, with heat and massage, and to try a warm shower when home, with massage.   Patient Name: [name] Contrina Orona [id number] Date: 05/09/2014     Maternal Data    Feeding Feeding Type: Breast Milk  LATCH Score/Interventions                      Lactation Tools Discussed/Used     Consult Status      Alfred Levins 05/09/2014, 1:20 PM

## 2014-05-12 ENCOUNTER — Telehealth: Payer: Self-pay | Admitting: *Deleted

## 2014-05-12 NOTE — Telephone Encounter (Signed)
Pt called and requested medication refill for headaches.  Contacted patient and reinforced phone call from Thursday 8/27, that Dr. Debroah Loop recommend she be evaluated in the [facility] for severe headaches.  Pt verbalizes understanding.

## 2014-05-13 ENCOUNTER — Encounter: Payer: Self-pay | Admitting: Obstetrics & Gynecology

## 2014-05-15 ENCOUNTER — Encounter: Payer: Self-pay | Admitting: *Deleted

## 2014-05-15 ENCOUNTER — Encounter: Payer: Medicaid Other | Admitting: Family Medicine

## 2014-05-15 ENCOUNTER — Other Ambulatory Visit: Payer: Self-pay | Admitting: Obstetrics and Gynecology

## 2014-05-15 DIAGNOSIS — O26893 Other specified pregnancy related conditions, third trimester: Secondary | ICD-10-CM

## 2014-05-15 DIAGNOSIS — R51 Headache: Principal | ICD-10-CM

## 2014-05-15 MED ORDER — BUTALBITAL-APAP-CAFFEINE 50-500-40 MG PO TABS: 1.0000 | ORAL_TABLET | Freq: Four times a day (QID) | ORAL | Status: DC | PRN

## 2014-05-15 NOTE — Progress Notes (Signed)
Pt at front desk of clinic requesting to have headache medication refilled.  Notes reviewed and consulted with Dr. Jolayne Panther, if blood pressure within normal limits Dr. Jolayne Panther will refill medication.  Blood pressure taken, within normal limits, paper prescription given.

## 2014-06-02 ENCOUNTER — Telehealth: Payer: Self-pay | Admitting: *Deleted

## 2014-06-02 DIAGNOSIS — K2100 Gastro-esophageal reflux disease with esophagitis, without bleeding: Secondary | ICD-10-CM

## 2014-06-02 DIAGNOSIS — R12 Heartburn: Secondary | ICD-10-CM

## 2014-06-02 MED ORDER — RANITIDINE HCL 150 MG PO TABS: 150.0000 mg | ORAL_TABLET | Freq: Every day | ORAL | Status: DC

## 2014-06-02 MED ORDER — OMEPRAZOLE 40 MG PO CPDR: 40.0000 mg | DELAYED_RELEASE_CAPSULE | Freq: Two times a day (BID) | ORAL | Status: DC

## 2014-06-02 NOTE — Telephone Encounter (Signed)
Patient called and left another message stating she would like a refill on her zantac and prilosec. Dr Macon Large approved of refills. Called patient back and informed her of refills. Patient verbalized understanding and states that she is trying to pump milk in addition to breastfeeding and has already spoke with [facility] but wants to know what she can take to increase her supply. Recommended fenugreek, warm compresses and mother's milk to patient. Patient verbalized understanding and had no other questions

## 2014-06-02 NOTE — Telephone Encounter (Signed)
[name] [name] called and states she has stopped pumping milk and was wondering what she could do to pump more milk.

## 2014-06-18 ENCOUNTER — Encounter: Payer: Self-pay | Admitting: Obstetrics & Gynecology

## 2014-06-18 ENCOUNTER — Ambulatory Visit: Payer: Medicaid Other | Admitting: Obstetrics & Gynecology

## 2014-06-18 ENCOUNTER — Telehealth: Payer: Self-pay | Admitting: *Deleted

## 2014-06-18 NOTE — Telephone Encounter (Signed)
Contacted patient and informed of missed appointment, patient states she will call and reschedule appointment.

## 2014-06-20 ENCOUNTER — Telehealth: Payer: Self-pay

## 2014-06-20 DIAGNOSIS — R51 Headache: Principal | ICD-10-CM

## 2014-06-20 DIAGNOSIS — O26893 Other specified pregnancy related conditions, third trimester: Secondary | ICD-10-CM

## 2014-06-20 MED ORDER — BUTALBITAL-APAP-CAFFEINE 50-500-40 MG PO TABS: 1.0000 | ORAL_TABLET | Freq: Four times a day (QID) | ORAL | Status: AC | PRN

## 2014-06-20 NOTE — Telephone Encounter (Signed)
Patient called stating she has an appointment 10/19 and needs refill of Fioricet.  Dr. Debroah LoopArnold reviewed chart and agreed to refill for 10 tabs. RX called into pharmacy [facility] [place]). Patient informed.

## 2014-06-30 ENCOUNTER — Telehealth: Payer: Self-pay

## 2014-06-30 ENCOUNTER — Encounter: Payer: Self-pay | Admitting: Obstetrics & Gynecology

## 2014-06-30 ENCOUNTER — Ambulatory Visit: Payer: Medicaid Other | Admitting: Obstetrics & Gynecology

## 2014-06-30 NOTE — Telephone Encounter (Signed)
Patient missed PP appointment today. Attempted to call patient. No answer. Left message stating we are sorry you missed your appointment today, please call clinic as soon as possible to reschedule.

## 2014-07-14 ENCOUNTER — Encounter: Payer: Self-pay | Admitting: *Deleted

## 2014-08-28 ENCOUNTER — Other Ambulatory Visit: Payer: Self-pay | Admitting: Obstetrics & Gynecology

## 2014-10-17 ENCOUNTER — Other Ambulatory Visit: Payer: Self-pay | Admitting: Obstetrics & Gynecology

## 2014-11-17 ENCOUNTER — Other Ambulatory Visit: Payer: Self-pay | Admitting: Obstetrics & Gynecology

## 2015-01-08 ENCOUNTER — Other Ambulatory Visit: Payer: Self-pay | Admitting: Obstetrics & Gynecology

## 2015-02-23 ENCOUNTER — Other Ambulatory Visit: Payer: Self-pay | Admitting: Obstetrics & Gynecology

## 2015-03-21 IMAGING — US US ABDOMEN COMPLETE
1 series · 13 of 25 positions shown · non-contrast
Comparison: CT scan of the abdomen dated July 21, 2013.

CLINICAL DATA: Epigastric pain.

EXAM:
ULTRASOUND ABDOMEN COMPLETE

[Series 1: us abdomen complete · 0.21mm/px · 13 of 94 slices shown]
[im 1/94]
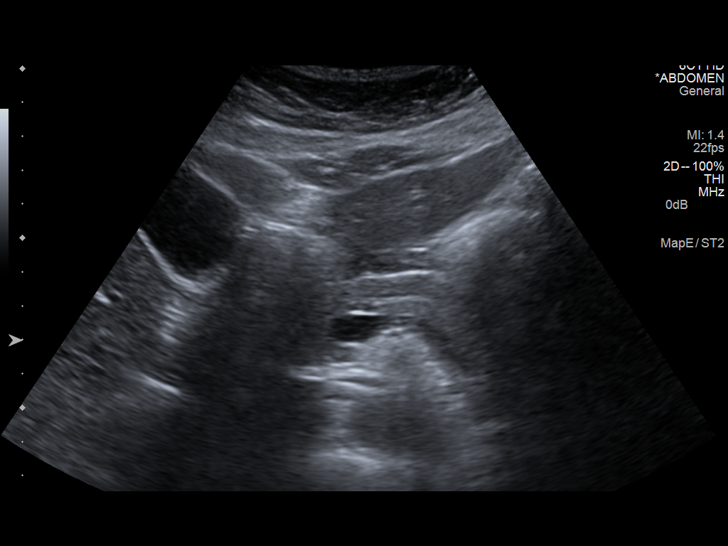
[im 8/94]
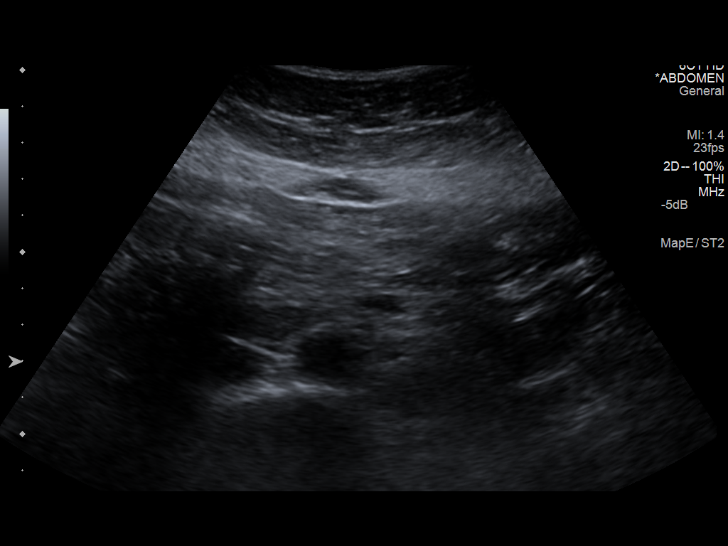
[im 16/94]
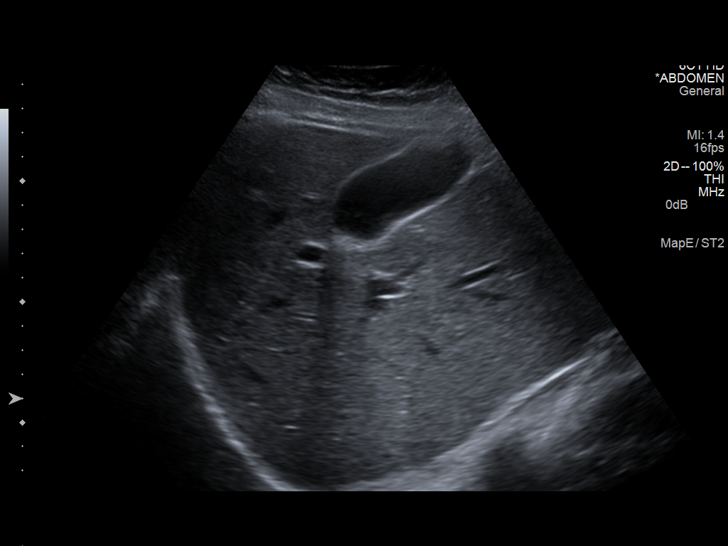
[im 24/94]
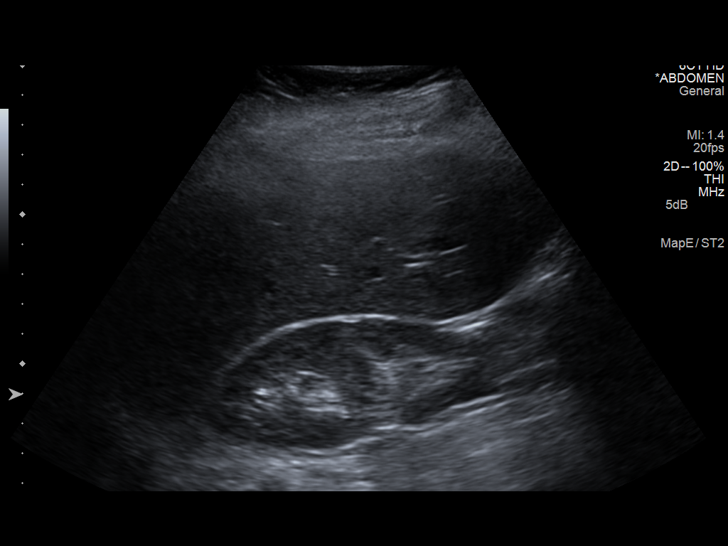
[im 32/94]
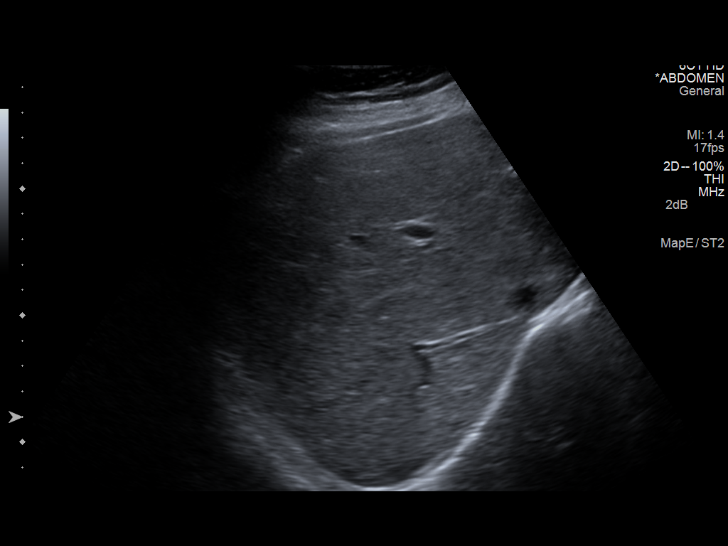
[im 39/94]
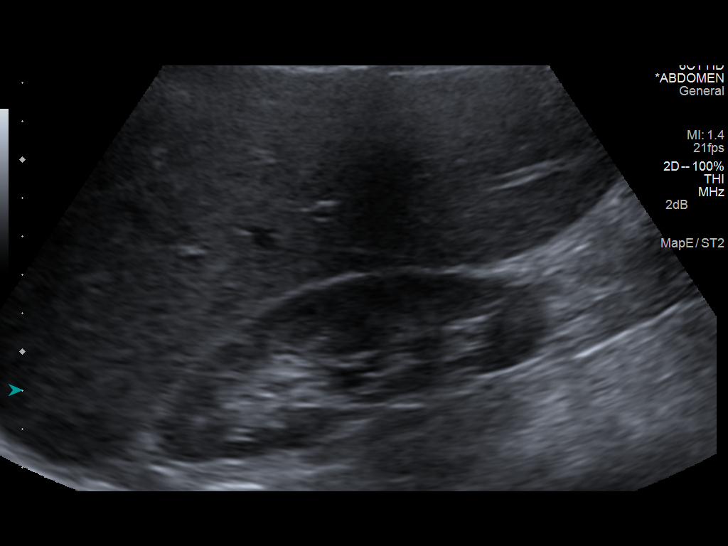
[im 47/94]
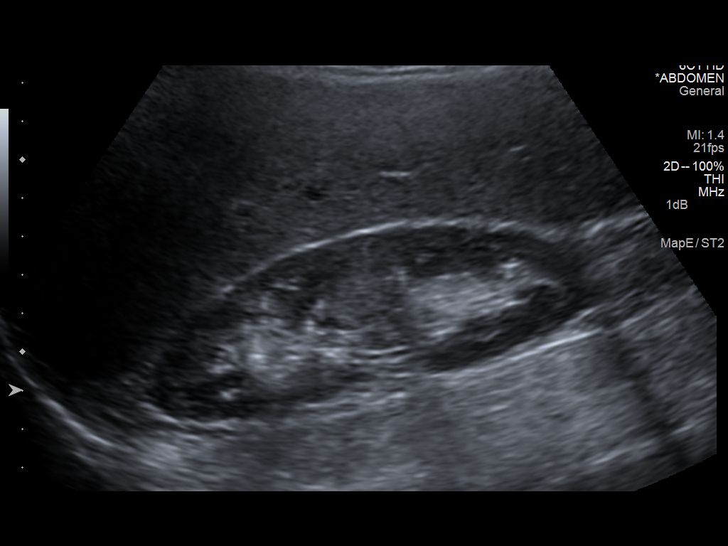
[im 55/94]
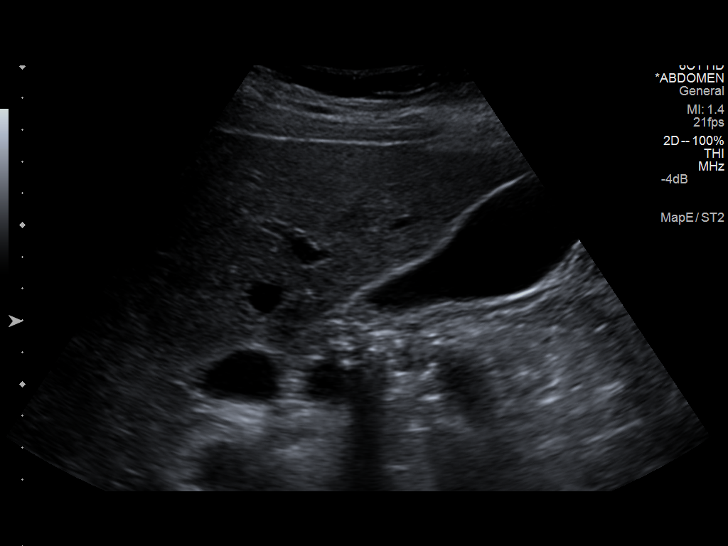
[im 63/94]
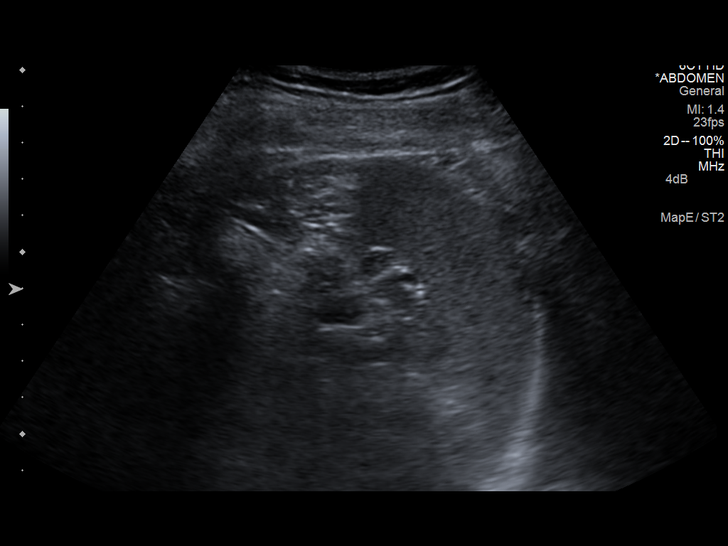
[im 70/94]
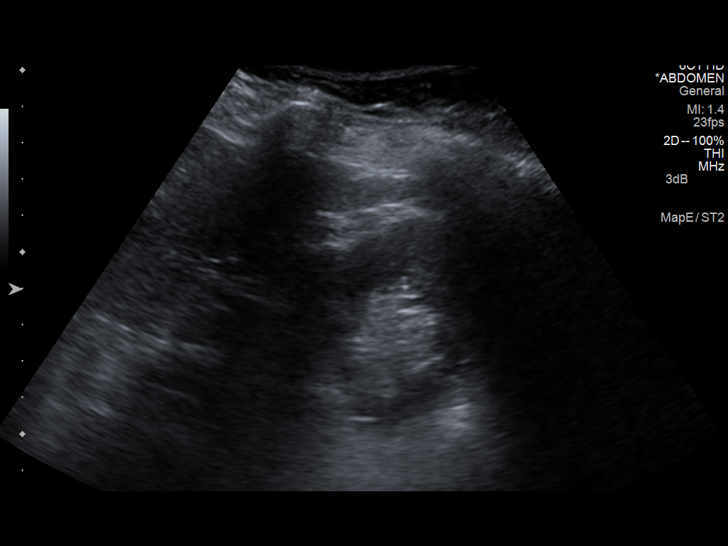
[im 78/94]
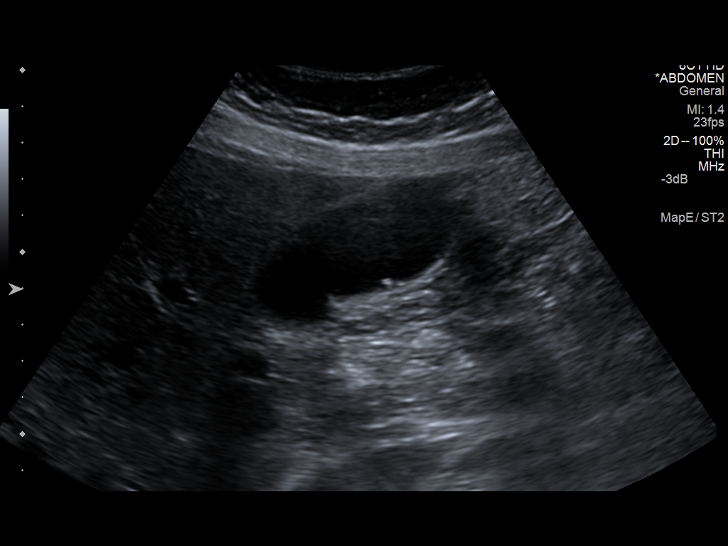
[im 86/94]
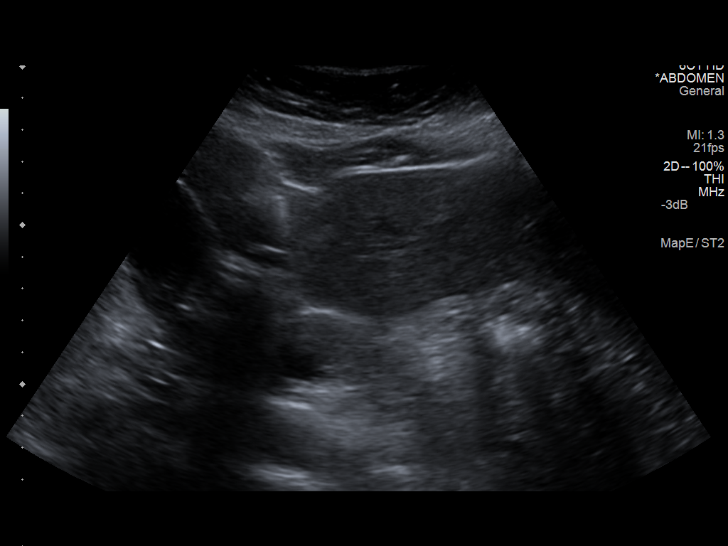
[im 94/94]
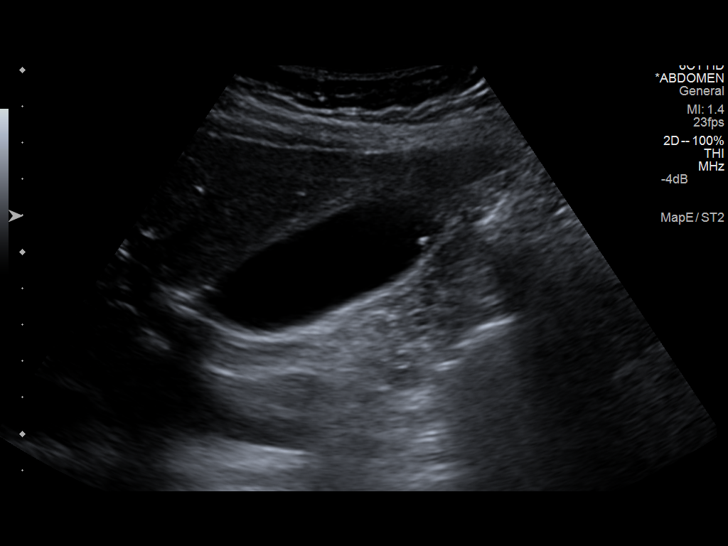

[13 of 25 positions shown; findings below may reference images not displayed]

FINDINGS: Gallbladder:

The gallbladder is adequately distended and contains echogenic
mobile shadowing stones. There also adherent intramural shadowing
foci which may reflect mural calcifications or calcifications within
polyps. There is no gallbladder wall thickening or pericholecystic
fluid nor positive sonographic Murphy's sign.

Common bile duct:

Diameter: 2.9 mm

Liver:

No focal lesion identified. Within normal limits in parenchymal
echogenicity.

IVC:

No abnormality visualized.

Pancreas:

Visualized portion unremarkable.

Spleen:

Size and appearance within normal limits.

Right Kidney:

Length: 11.5 cm. Echogenicity within normal limits. No mass or
hydronephrosis visualized.

Left Kidney:

Length: 11.0 cm. Echogenicity within normal limits. No mass or
hydronephrosis visualized.

Abdominal aorta:

No aneurysm visualized.

Other findings:

No ascites is demonstrated.
IMPRESSION: 1. There are small mobile gallstones as well as echogenic nonmobile
foci which may reflect adherent non mobile stones, polyps, or mural
calcification. There is no sonographic evidence of acute
cholecystitis.
2. Otherwise the examination is within the limits of normal.

## 2015-04-02 IMAGING — US US ABDOMEN LIMITED
1 series · 14 of 25 positions shown · non-contrast
Comparison: October 04, 2013

CLINICAL DATA: Right upper quadrant pain

EXAM:
US ABDOMEN LIMITED - RIGHT UPPER QUADRANT

[Series 1: us abdomen limited · 14 of 38 slices shown]
[im 1/38]
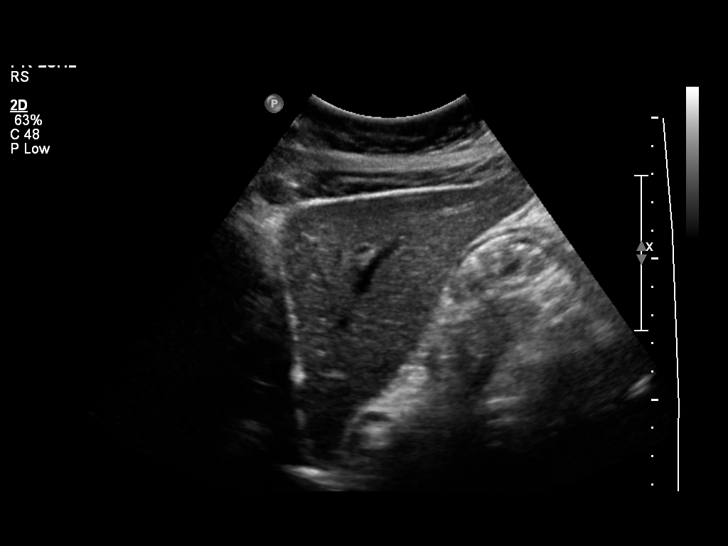
[im 4/38]
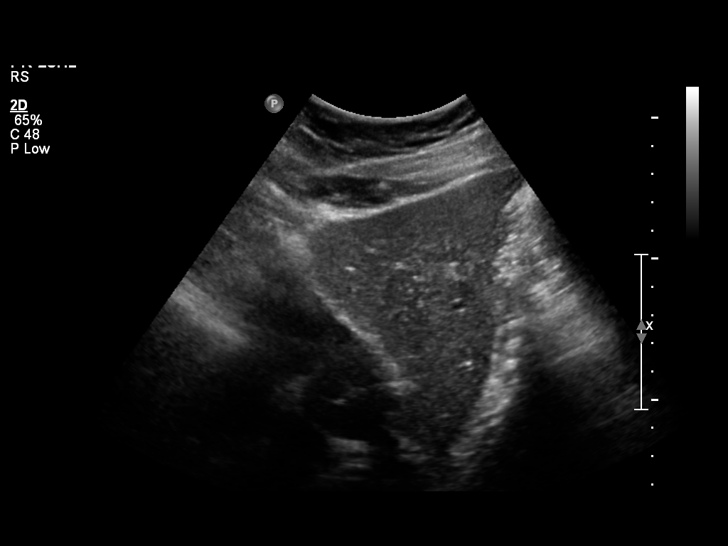
[im 7/38]
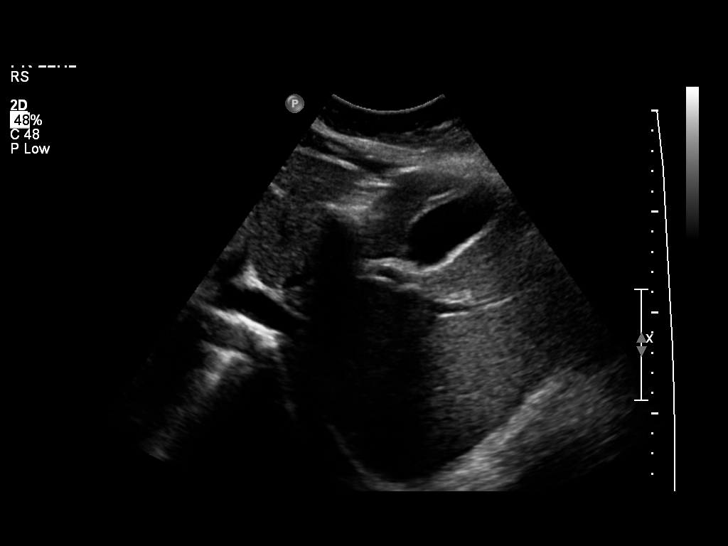
[im 10/38]
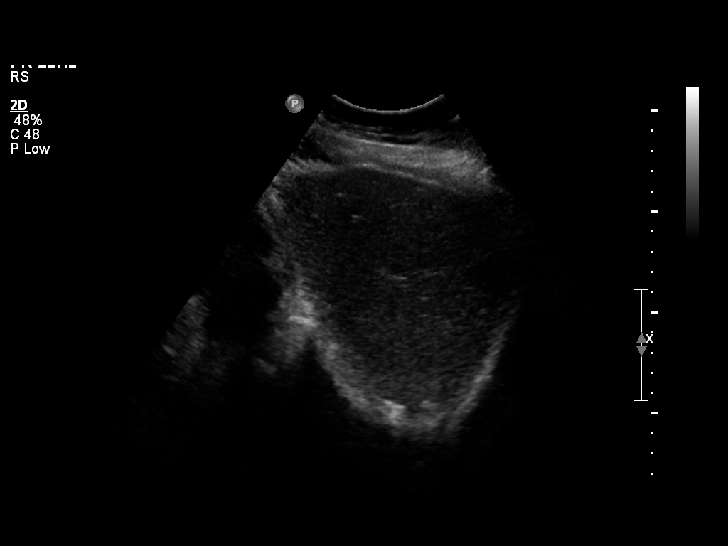
[im 13/38]
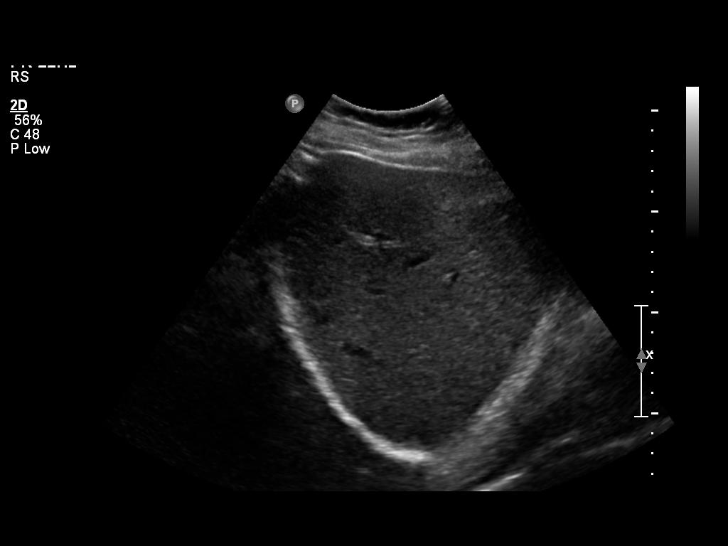
[im 14/38]
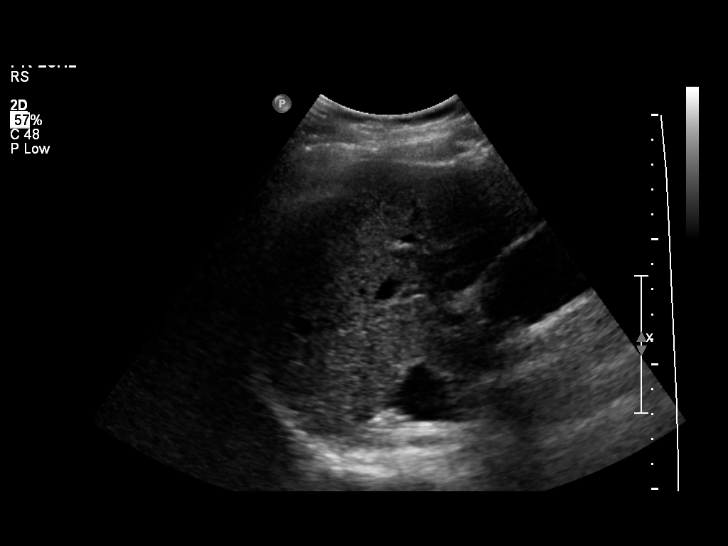
[im 17/38]
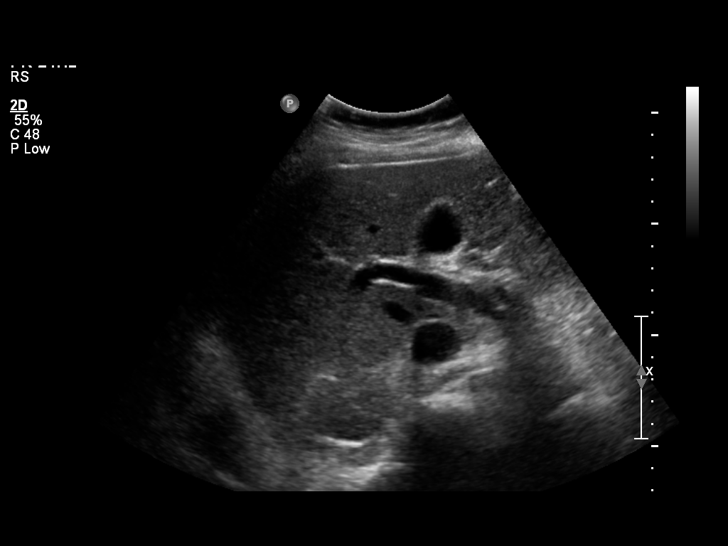
[im 21/38]
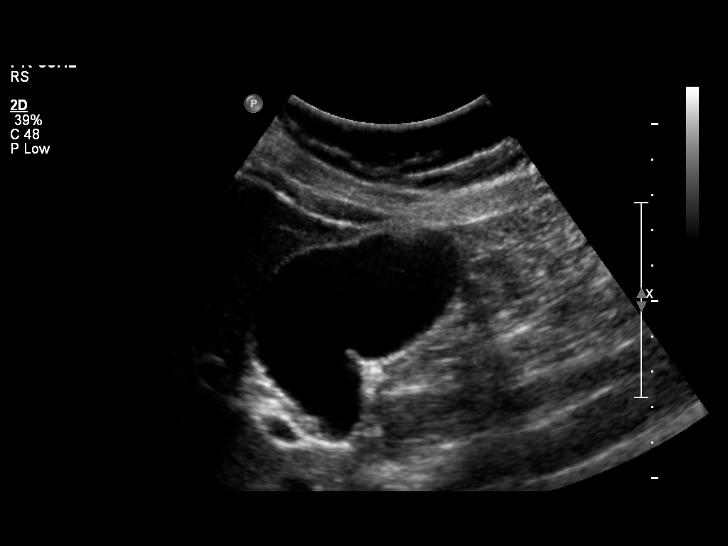
[im 24/38]
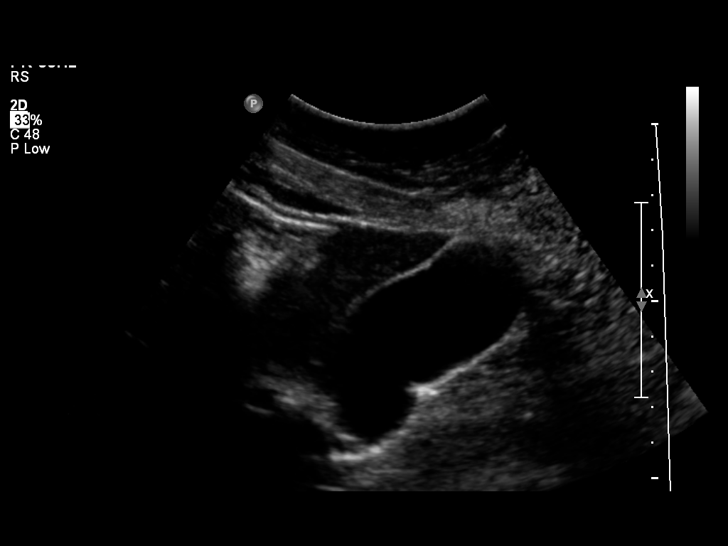
[im 25/38]
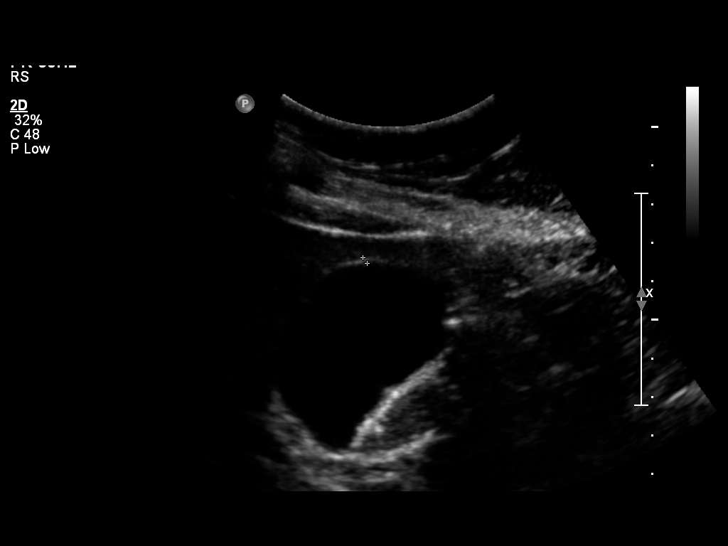
[im 28/38]
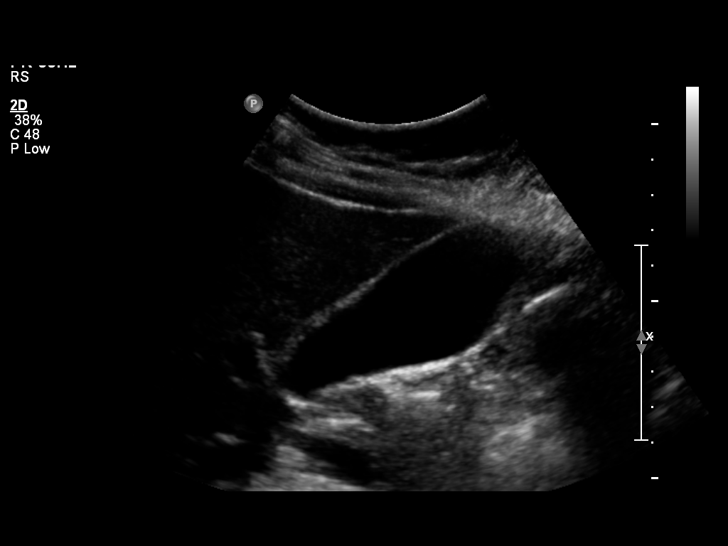
[im 31/38]
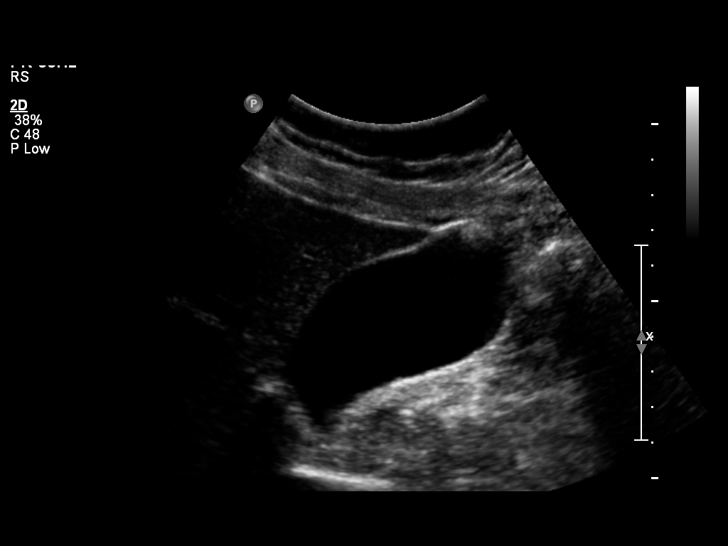
[im 34/38]
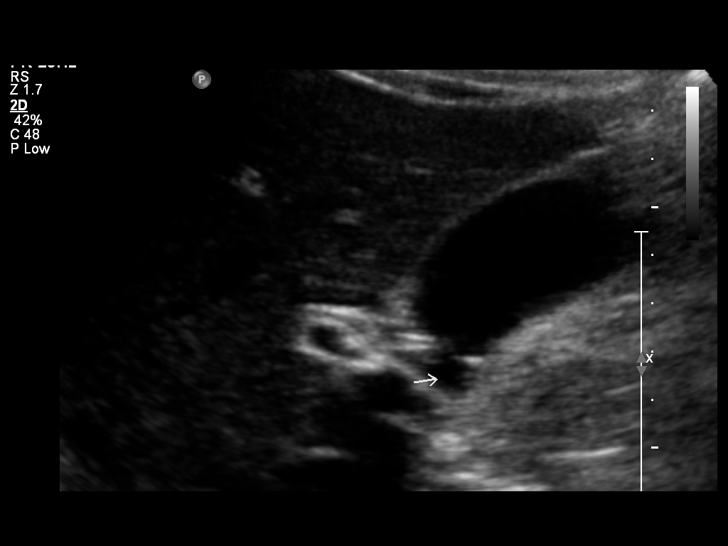
[im 38/38]
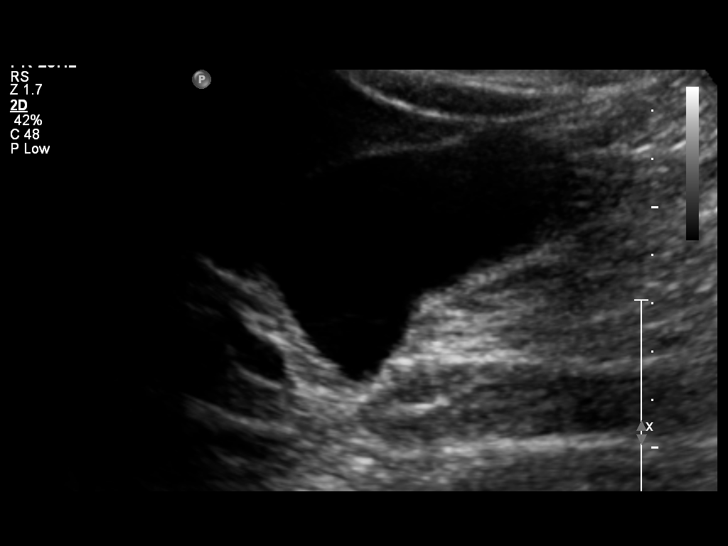

[14 of 25 positions shown; findings below may reference images not displayed]

FINDINGS: Gallbladder:

A single echogenic focus which moves in shadows is seen in the
gallbladder. Note that fewer gallstones are seen at this time
compared to recent prior study. There is no gallbladder wall
thickening or pericholecystic fluid. Patient is mildly tender over
the gallbladder.

Common bile duct:

Diameter: 3 mm. There is no intrahepatic or extrahepatic biliary
duct dilatation.

Liver:

No focal lesion identified. Within normal limits in parenchymal
echogenicity.
IMPRESSION: Cholelithiasis with fewer gallstones seen on the current study
compared to recent prior study. Note that there is no biliary duct
dilatation or biliary duct calculus seen, however. Patient mildly
tender over the gallbladder. Study otherwise unremarkable.

## 2015-05-07 IMAGING — US US ABDOMEN LIMITED
1 series · 13 of 25 positions shown · non-contrast
Comparison: none

[Series 1: us abdomen limited · 0.20mm/px · 13 of 47 slices shown]
[im 1/47]
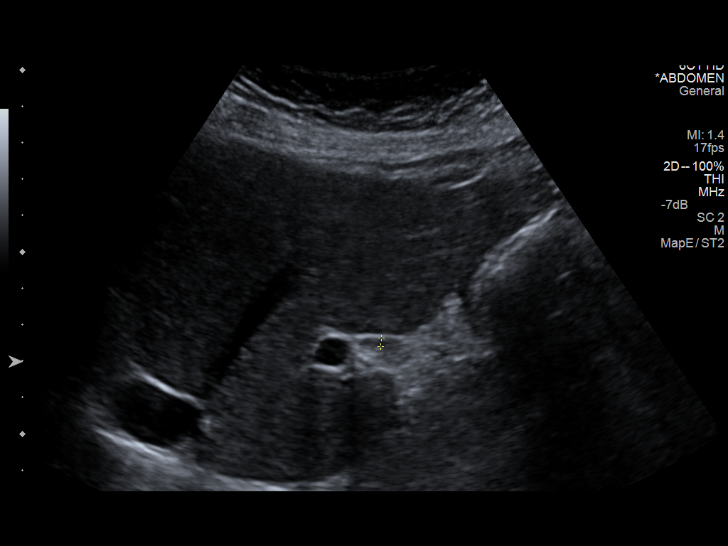
[im 4/47]
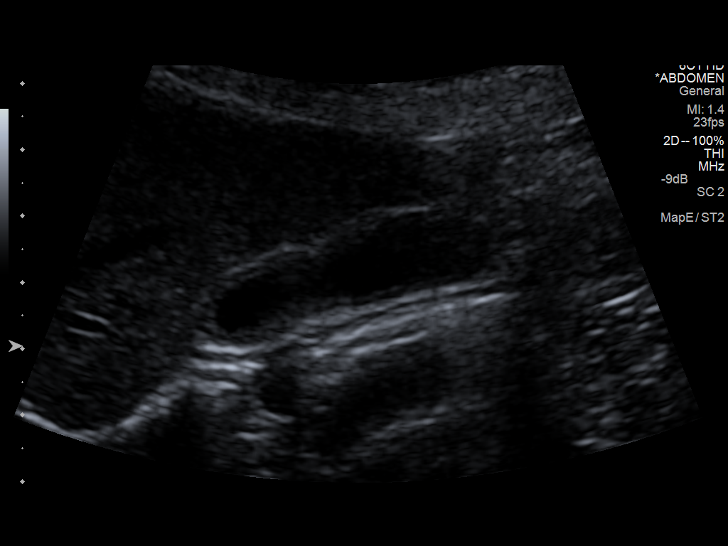
[im 8/47]
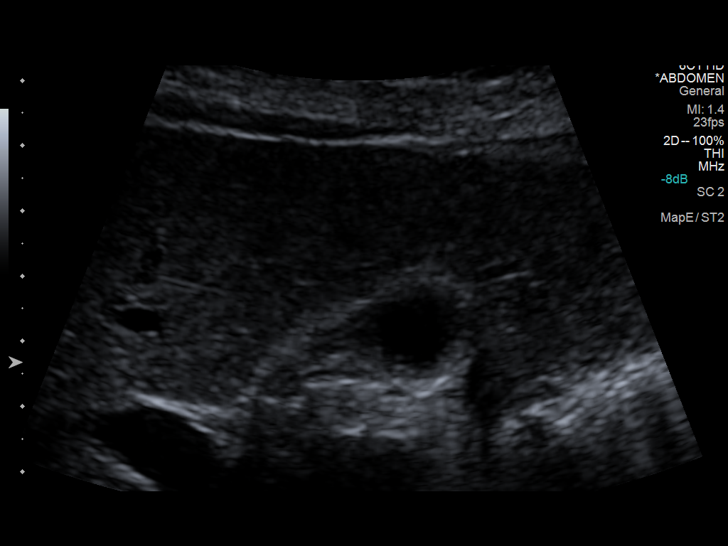
[im 12/47]
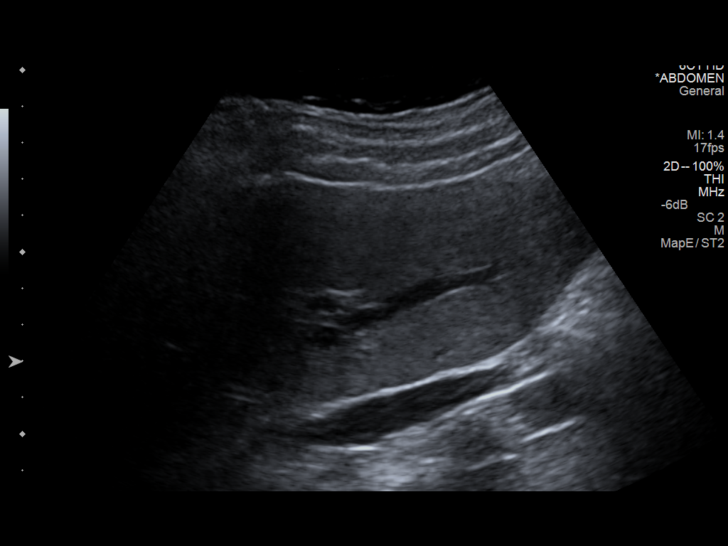
[im 16/47]
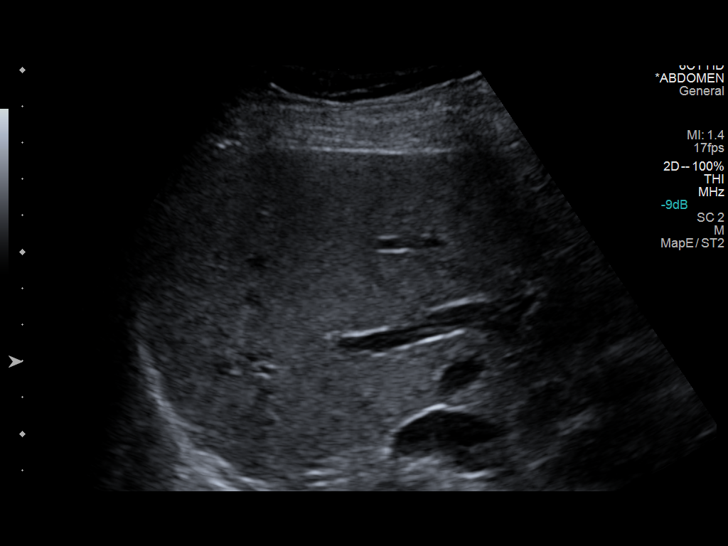
[im 20/47]
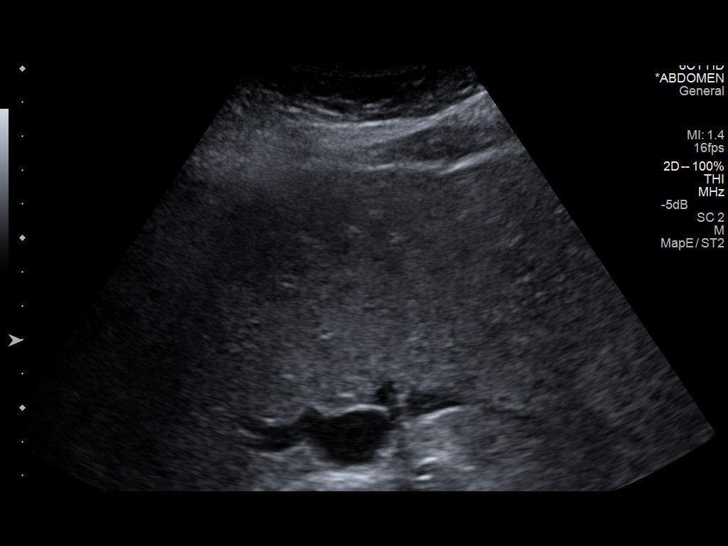
[im 24/47]
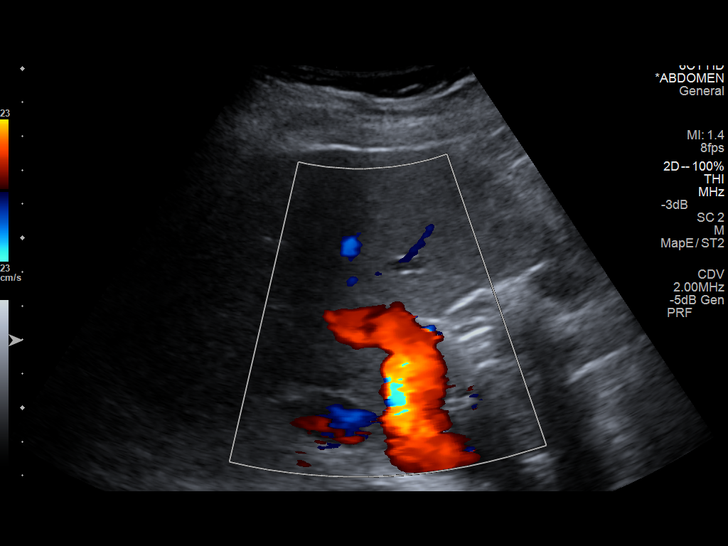
[im 27/47]
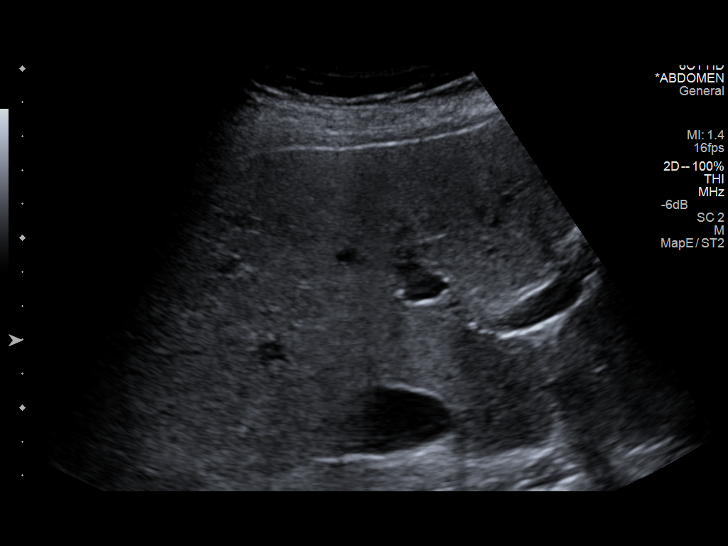
[im 31/47]
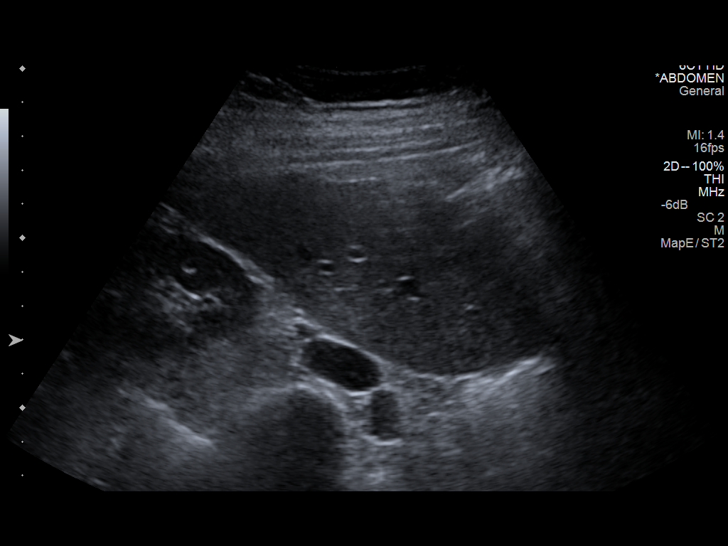
[im 35/47]
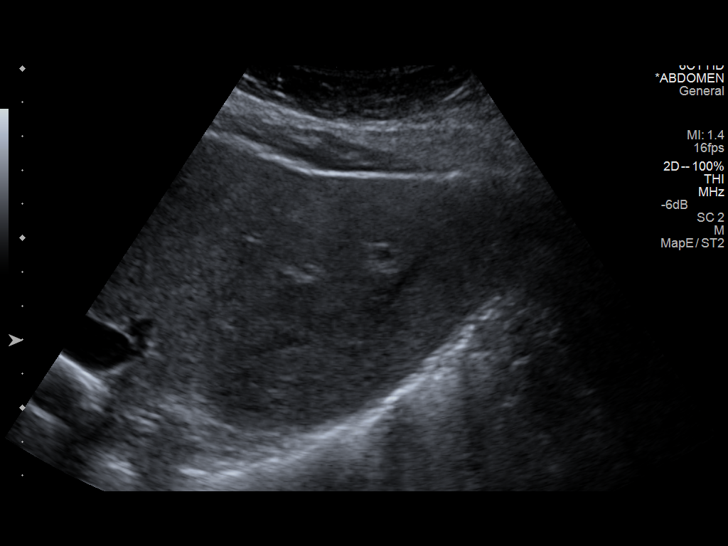
[im 39/47]
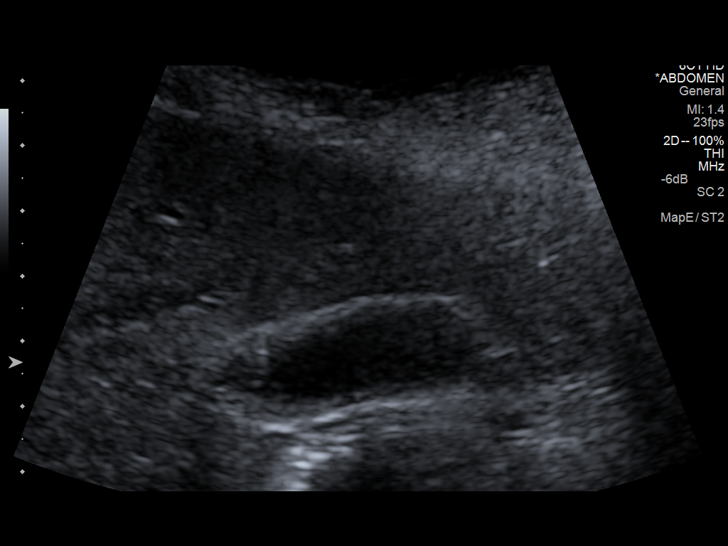
[im 43/47]
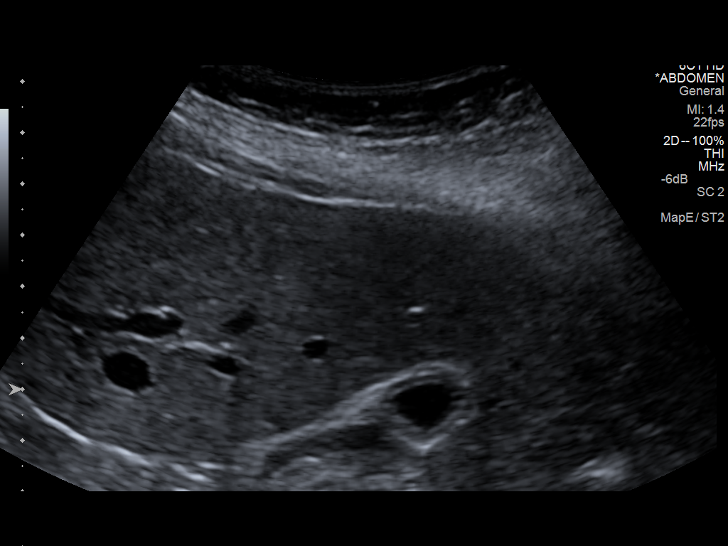
[im 47/47]
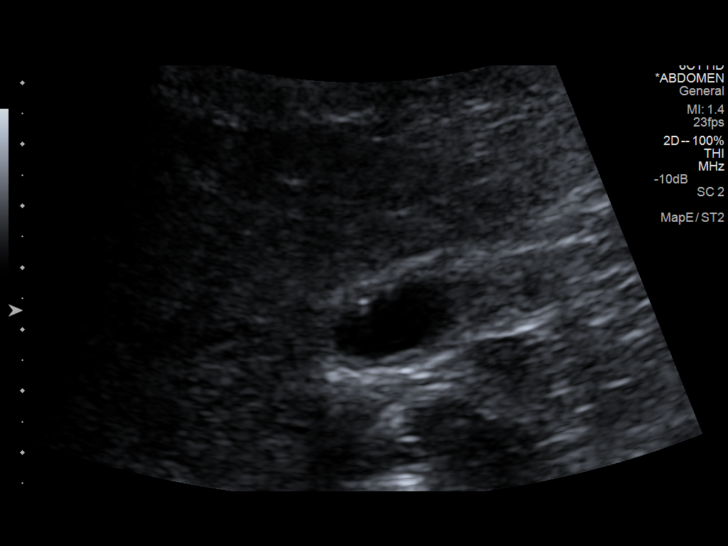

[13 of 25 positions shown; findings below may reference images not displayed]

CLINICAL DATA
Epigastric pain with nausea and vomiting and diarrhea.

EXAM
US ABDOMEN LIMITED - RIGHT UPPER QUADRANT

COMPARISON
10/16/2013

FINDINGS
Gallbladder:

Gallbladder is not well distended on the current study. The tiny
mobile gallstones seen on the previous exams are not evident on
today's study. Gallbladder wall is irregular and appears thickened,
measuring up to 4-5 mm in diameter. Hypoechoic striations within the
gallbladder wall raise concern for gallbladder wall edema. No
evidence for pericholecystic fluid. The sonographer reports a
positive sonographic Murphy sign.

Common bile duct:

Diameter: Nondilated at 2-3 mm diameter.

Liver:

No focal lesion identified. Within normal limits in parenchymal
echogenicity.

IMPRESSION
Gallbladder wall appears thickened and somewhat irregular and
sonographer reports a positive sonographic Murphy sign, both
features raising concern for acute cholecystitis. Patient has had
gallstones noted on previous studies although they are not evident
on this exam. The patient did eat shortly before this study and
gallbladder is not well distended which may accentuate gallbladder
wall thickness. If the clinical picture is equivocal for acute
cholecystitis, nuclear scintigraphy may prove helpful to further
evaluate.

SIGNATURE

## 2015-05-29 ENCOUNTER — Other Ambulatory Visit: Payer: Self-pay | Admitting: Obstetrics & Gynecology

## 2015-07-09 ENCOUNTER — Other Ambulatory Visit: Payer: Self-pay | Admitting: Obstetrics & Gynecology
